# Patient Record
Sex: Female | Born: 1985
Health system: Southern US, Community
[De-identification: ages and names within clinical notes are randomized; demographics above are authoritative.]

## PROBLEM LIST (undated history)

## (undated) ENCOUNTER — Emergency Department (HOSPITAL_BASED_OUTPATIENT_CLINIC_OR_DEPARTMENT_OTHER): Admission: EM | Payer: Self-pay | Source: Home / Self Care

## (undated) DIAGNOSIS — N6320 Unspecified lump in the left breast, unspecified quadrant: Secondary | ICD-10-CM

## (undated) DIAGNOSIS — J209 Acute bronchitis, unspecified: Secondary | ICD-10-CM

## (undated) DIAGNOSIS — F17200 Nicotine dependence, unspecified, uncomplicated: Secondary | ICD-10-CM

## (undated) HISTORY — DX: Acute bronchitis, unspecified: J20.9

## (undated) HISTORY — DX: Unspecified lump in the left breast, unspecified quadrant: N63.20

## (undated) HISTORY — DX: Nicotine dependence, unspecified, uncomplicated: F17.200

## (undated) HISTORY — PX: TUBAL LIGATION: SHX77

## (undated) HISTORY — PX: DENTAL SURGERY: SHX609

---

## 2012-09-03 ENCOUNTER — Encounter (HOSPITAL_COMMUNITY): Payer: Self-pay | Admitting: Emergency Medicine

## 2012-09-03 ENCOUNTER — Emergency Department (HOSPITAL_COMMUNITY)
Admission: EM | Admit: 2012-09-03 | Discharge: 2012-09-03 | Disposition: A | Discharge status: Home / Self Care | Payer: Medicaid Other | Attending: Emergency Medicine | Admitting: Emergency Medicine

## 2012-09-03 DIAGNOSIS — IMO0002 Reserved for concepts with insufficient information to code with codable children: Secondary | ICD-10-CM | POA: Insufficient documentation

## 2012-09-03 DIAGNOSIS — S39012A Strain of muscle, fascia and tendon of lower back, initial encounter: Secondary | ICD-10-CM

## 2012-09-03 DIAGNOSIS — Y929 Unspecified place or not applicable: Secondary | ICD-10-CM | POA: Insufficient documentation

## 2012-09-03 DIAGNOSIS — X58XXXA Exposure to other specified factors, initial encounter: Secondary | ICD-10-CM | POA: Insufficient documentation

## 2012-09-03 DIAGNOSIS — F17200 Nicotine dependence, unspecified, uncomplicated: Secondary | ICD-10-CM | POA: Insufficient documentation

## 2012-09-03 DIAGNOSIS — Y939 Activity, unspecified: Secondary | ICD-10-CM | POA: Insufficient documentation

## 2012-09-03 MED ORDER — IBUPROFEN 600 MG PO TABS: 600.0000 mg | ORAL_TABLET | Freq: Four times a day (QID) | ORAL | Status: DC | PRN

## 2012-09-03 MED ORDER — KETOROLAC TROMETHAMINE 60 MG/2ML IM SOLN
60.0000 mg | Freq: Once | INTRAMUSCULAR | Status: AC
  Administered 2012-09-03: 60 mg via INTRAMUSCULAR
  Filled 2012-09-03: qty 2

## 2012-09-03 MED ORDER — CYCLOBENZAPRINE HCL 10 MG PO TABS: 10.0000 mg | ORAL_TABLET | Freq: Two times a day (BID) | ORAL | Status: DC | PRN

## 2012-09-03 NOTE — ED Notes (Signed)
Per [facility]- pt presents with NAD_ c/o of shoulder blade pain bil x 1 day- Pt c/o of increased pain with deep breath and movement.  GCS 15 PEERL No neuro deficits

## 2012-09-03 NOTE — ED Notes (Signed)
To ED via [facility] with c/o woke up with "difficulty breathing" SIde hurts- "Catches when I take a breath" A/O x3 , No obvious resp disitress.

## 2012-09-03 NOTE — ED Provider Notes (Signed)
History     CSN: [id number]  Arrival date & time 09/03/12  1130   First MD Initiated Contact with Patient 09/03/12 1247      Chief Complaint  Patient presents with  . Back Pain  . hurts to breath     (Consider location/radiation/quality/duration/timing/severity/associated sxs/prior treatment) HPI Comments: This is a 26 year old female, who presents to the emergency department with chief complaint of back pain. Patient states that upon waking this morning, she noticed a sharp stabbing sensation on the left side of her back. Her symptoms are reproduced with deep breathing and with palpation. Her pain is 7/10. She has not tried taking anything to alleviate her symptoms. She denies chest pain, shortness of breath, nausea, vomiting, diarrhea, constipation, dysuria, vaginal discharge, numbness or tingling of the extremities.  Her cardiac risk factors include family history and smoking. She denies recent travel.  The history is provided by the patient. No language interpreter was used.    History reviewed. No pertinent past medical history.  Past Surgical History  Procedure Date  . Tubal ligation 2 years ago  . Dental surgery     History reviewed. No pertinent family history.  History  Substance Use Topics  . Smoking status: Current Every Day Smoker -- 0.5 packs/day  . Smokeless tobacco: Not on file  . Alcohol Use: Yes     Comment: occasionally    OB History    Grav Para Term Preterm Abortions TAB SAB Ect Mult Living                  Review of Systems  Musculoskeletal: Positive for back pain.  All other systems reviewed and are negative.    Allergies  Review of patient's allergies indicates no known allergies.  Home Medications  No current outpatient prescriptions on file.  BP 122/75  Pulse 73  Temp 98.3 F (36.8 C) (Oral)  Resp 16  SpO2 98%  LMP 08/17/2012  Physical Exam  Nursing note and vitals reviewed. Constitutional: She is oriented to person, place,  and time. She appears well-developed and well-nourished.  HENT:  Head: Normocephalic and atraumatic.  Eyes: Conjunctivae normal and EOM are normal. Pupils are equal, round, and reactive to light.  Neck: Normal range of motion. Neck supple.  Cardiovascular: Normal rate and regular rhythm.  Exam reveals no gallop and no friction rub.   No murmur heard. Pulmonary/Chest: Effort normal and breath sounds normal. No respiratory distress. She has no wheezes. She has no rales. She exhibits no tenderness.  Abdominal: Soft. Bowel sounds are normal. She exhibits no distension and no mass. There is no tenderness. There is no rebound and no guarding.  Musculoskeletal: Normal range of motion. She exhibits tenderness. She exhibits no edema.       No bony tenderness over the spine. Left-sided latissimus dorsi, rhomboids, lower traps tender to palpation with several palpable trigger points.  Full range of motion, strength and sensation intact bilaterally  Neurological: She is alert and oriented to person, place, and time.  Skin: Skin is warm and dry.  Psychiatric: She has a normal mood and affect. Her behavior is normal. Judgment and thought content normal.    ED Course  Procedures (including critical care time)     1. Back strain       MDM  26 year old female with back ache. I believe the patient's symptoms are coming from sore muscles. She does not having chest pain, shortness of breath.  Doubt PE based on PERC rule out.  I am going to give the patient 60 mg Toradol IM, and re-evaluate.  2:54 PM Patient feeling better after receiving Toradol.  I believe this to musculoskeletal in nature.  I am going to send the patient home with ibuprofen and flexeril.  I have instructed the patient to ice 3x daily for 20 minutes.  I have also encouraged getting follow-up care with a PCP.  The patient is agreeable with this.  The patient is stable and ready for discharge.        Roxy Horseman,  PA-C 09/03/12 1457

## 2012-09-03 NOTE — ED Notes (Addendum)
Pt reports blurred vision, has been going on for few months. C/o pain with inhalation/deep breathing on left side of back/rib cage. Pain with sneezing. Does not know if diabetic. Family hx of cardiac problems

## 2012-09-11 NOTE — ED Provider Notes (Signed)
Medical screening examination/treatment/procedure(s) were performed by non-physician practitioner and as supervising physician I was immediately available for consultation/collaboration.  Nixon Kolton, MD 09/11/12 0134 

## 2012-09-11 NOTE — ED Provider Notes (Signed)
Medical screening examination/treatment/procedure(s) were performed by non-physician practitioner and as supervising physician I was immediately available for consultation/collaboration.  Raeford Razor, MD 09/11/12 [phone]

## 2013-07-17 ENCOUNTER — Emergency Department (HOSPITAL_COMMUNITY)
Admission: EM | Admit: 2013-07-17 | Discharge: 2013-07-18 | Disposition: A | Discharge status: Home / Self Care | Payer: Self-pay | Attending: Emergency Medicine | Admitting: Emergency Medicine

## 2013-07-17 ENCOUNTER — Encounter (HOSPITAL_COMMUNITY): Payer: Self-pay | Admitting: *Deleted

## 2013-07-17 DIAGNOSIS — N898 Other specified noninflammatory disorders of vagina: Secondary | ICD-10-CM | POA: Insufficient documentation

## 2013-07-17 DIAGNOSIS — F17200 Nicotine dependence, unspecified, uncomplicated: Secondary | ICD-10-CM | POA: Insufficient documentation

## 2013-07-17 DIAGNOSIS — Z9851 Tubal ligation status: Secondary | ICD-10-CM | POA: Insufficient documentation

## 2013-07-17 DIAGNOSIS — Z3202 Encounter for pregnancy test, result negative: Secondary | ICD-10-CM | POA: Insufficient documentation

## 2013-07-17 LAB — URINALYSIS, ROUTINE W REFLEX MICROSCOPIC
Ketones, ur: 15 mg/dL — AB
Nitrite: NEGATIVE
Protein, ur: NEGATIVE mg/dL
pH: 6 (ref 5.0–8.0)

## 2013-07-17 LAB — POCT I-STAT, CHEM 8
Calcium, Ion: 1.19 mmol/L (ref 1.12–1.23)
Chloride: 108 mEq/L (ref 96–112)
HCT: 42 % (ref 36.0–46.0)
Sodium: 145 mEq/L (ref 135–145)
TCO2: 27 mmol/L (ref 0–100)

## 2013-07-17 LAB — POCT PREGNANCY, URINE: Preg Test, Ur: NEGATIVE

## 2013-07-17 LAB — URINE MICROSCOPIC-ADD ON

## 2013-07-17 NOTE — ED Notes (Signed)
Pt states. "urine very dark, tongue dry, and lower abd. Pain."

## 2013-07-17 NOTE — ED Notes (Addendum)
Blister on upper left side. No drainage. Wants to be checked out for STD. Ariana Rogers vaginal d/c x 1 week. Happens before and after cycle.

## 2013-07-18 LAB — WET PREP, GENITAL: Trich, Wet Prep: NONE SEEN

## 2013-07-18 LAB — RPR: RPR Ser Ql: NONREACTIVE

## 2013-07-18 MED ORDER — AZITHROMYCIN 250 MG PO TABS
1000.0000 mg | ORAL_TABLET | Freq: Once | ORAL | Status: AC
  Administered 2013-07-18: 1000 mg via ORAL
  Filled 2013-07-18: qty 4

## 2013-07-18 MED ORDER — CEFTRIAXONE SODIUM 250 MG IJ SOLR
250.0000 mg | Freq: Once | INTRAMUSCULAR | Status: AC
  Administered 2013-07-18: 250 mg via INTRAMUSCULAR
  Filled 2013-07-18: qty 250

## 2013-07-18 MED ORDER — LIDOCAINE HCL (PF) 1 % IJ SOLN
INTRAMUSCULAR | Status: AC
  Administered 2013-07-18: 0.9 mL
  Filled 2013-07-18: qty 5

## 2013-07-18 NOTE — ED Notes (Signed)
MD at bedside. 

## 2013-07-19 LAB — GC/CHLAMYDIA PROBE AMP: GC Probe RNA: NEGATIVE

## 2013-07-19 NOTE — ED Provider Notes (Signed)
CSN: [id number]     Arrival date & time 07/17/13  2001 History   First MD Initiated Contact with Patient 07/17/13 2354     Chief Complaint  Patient presents with  . Blister   (Consider location/radiation/quality/duration/timing/severity/associated sxs/prior Treatment) HPI History provided by patient. Is sexually active and has developed white vaginal discharge. No pelvic pain. Patient also concerned about a blister to her left upper lip. No history of same. No genital lesions. No known STD exposures. Patient does have history of STD in the past. No back pain or abdominal pain. No fevers or chills. History reviewed. No pertinent past medical history. Past Surgical History  Procedure Laterality Date  . Tubal ligation  2 years ago  . Dental surgery     History reviewed. No pertinent family history. History  Substance Use Topics  . Smoking status: Current Every Day Smoker -- 0.50 packs/day  . Smokeless tobacco: Not on file  . Alcohol Use: Yes     Comment: occasionally   OB History   Grav Para Term Preterm Abortions TAB SAB Ect Mult Living                 Review of Systems  Constitutional: Negative for fever and chills.  HENT: Negative for neck pain and neck stiffness.   Eyes: Negative for pain.  Respiratory: Negative for shortness of breath.   Cardiovascular: Negative for chest pain.  Gastrointestinal: Negative for vomiting and abdominal pain.  Genitourinary: Positive for vaginal discharge. Negative for dysuria and vaginal bleeding.  Musculoskeletal: Negative for back pain.  Skin: Negative for rash.  Neurological: Negative for headaches.  All other systems reviewed and are negative.    Allergies  Review of patient's allergies indicates no known allergies.  Home Medications   Current Outpatient Rx  Name  Route  Sig  Dispense  Refill  . ibuprofen (ADVIL,MOTRIN) 200 MG tablet   Oral   Take 800 mg by mouth every 6 (six) hours as needed for pain or headache.           BP 125/87  Pulse 58  Temp(Src) 98.3 F (36.8 C) (Oral)  Resp 16  SpO2 98%  LMP 07/14/2013 Physical Exam  Constitutional: She is oriented to person, place, and time. She appears well-developed and well-nourished.  HENT:  Head: Normocephalic and atraumatic.  Erythematous patch of blisters left upper lip  Eyes: EOM are normal. Pupils are equal, round, and reactive to light.  Neck: Neck supple.  Cardiovascular: Normal rate, regular rhythm and intact distal pulses.   Pulmonary/Chest: Effort normal and breath sounds normal. No respiratory distress.  Abdominal: Soft. Bowel sounds are normal. She exhibits no distension. There is no tenderness.  Genitourinary:  Normal external GU without rash or lesions. Moderate white vaginal discharge. No cervical motion tenderness. No adnexal tenderness.  Musculoskeletal: Normal range of motion. She exhibits no edema.  Neurological: She is alert and oriented to person, place, and time.  Skin: Skin is warm and dry.    ED Course  Procedures (including critical care time) Labs Review Labs Reviewed  WET PREP, GENITAL - Abnormal; Notable for the following:    Clue Cells Wet Prep HPF POC FEW (*)    All other components within normal limits  URINALYSIS, ROUTINE W REFLEX MICROSCOPIC - Abnormal; Notable for the following:    APPearance CLOUDY (*)    Bilirubin Urine SMALL (*)    Ketones, ur 15 (*)    Leukocytes, UA SMALL (*)    All other  components within normal limits  URINE MICROSCOPIC-ADD ON - Abnormal; Notable for the following:    Squamous Epithelial / LPF MANY (*)    All other components within normal limits  GC/CHLAMYDIA PROBE AMP  RPR  POCT I-STAT, CHEM 8  POCT PREGNANCY, URINE   Patient requesting medications for STD and plan followup health department. Referral to women's clinic provided.  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 Vaginal discharge   2. HSV  1  Pending STD results Labs reviewed as above Medications provided Vital signs and nursing notes reviewed and considered    Sunnie Nielsen, MD 07/20/13 [phone]

## 2013-10-25 ENCOUNTER — Emergency Department (HOSPITAL_BASED_OUTPATIENT_CLINIC_OR_DEPARTMENT_OTHER): Payer: No Typology Code available for payment source

## 2013-10-25 ENCOUNTER — Encounter (HOSPITAL_BASED_OUTPATIENT_CLINIC_OR_DEPARTMENT_OTHER): Payer: Self-pay | Admitting: Emergency Medicine

## 2013-10-25 ENCOUNTER — Emergency Department (HOSPITAL_BASED_OUTPATIENT_CLINIC_OR_DEPARTMENT_OTHER)
Admission: EM | Admit: 2013-10-25 | Discharge: 2013-10-25 | Disposition: A | Discharge status: Home / Self Care | Payer: No Typology Code available for payment source | Attending: Emergency Medicine | Admitting: Emergency Medicine

## 2013-10-25 DIAGNOSIS — S39012A Strain of muscle, fascia and tendon of lower back, initial encounter: Secondary | ICD-10-CM

## 2013-10-25 DIAGNOSIS — S29019A Strain of muscle and tendon of unspecified wall of thorax, initial encounter: Secondary | ICD-10-CM

## 2013-10-25 DIAGNOSIS — S335XXA Sprain of ligaments of lumbar spine, initial encounter: Secondary | ICD-10-CM | POA: Insufficient documentation

## 2013-10-25 DIAGNOSIS — S239XXA Sprain of unspecified parts of thorax, initial encounter: Secondary | ICD-10-CM | POA: Insufficient documentation

## 2013-10-25 DIAGNOSIS — Y9289 Other specified places as the place of occurrence of the external cause: Secondary | ICD-10-CM | POA: Insufficient documentation

## 2013-10-25 DIAGNOSIS — F17200 Nicotine dependence, unspecified, uncomplicated: Secondary | ICD-10-CM | POA: Insufficient documentation

## 2013-10-25 DIAGNOSIS — Y9389 Activity, other specified: Secondary | ICD-10-CM | POA: Insufficient documentation
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
## 2013-10-25 MED ORDER — CYCLOBENZAPRINE HCL 10 MG PO TABS: 10.0000 mg | ORAL_TABLET | Freq: Three times a day (TID) | ORAL | Status: DC | PRN
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
## 2013-11-10 ENCOUNTER — Telehealth: Payer: Self-pay | Admitting: General Practice

## 2013-11-10 NOTE — Telephone Encounter (Signed)
Called pt, unable to talk will call back to schedule appt.

## 2014-01-13 ENCOUNTER — Emergency Department (HOSPITAL_COMMUNITY): Payer: Self-pay

## 2014-01-13 ENCOUNTER — Encounter (HOSPITAL_COMMUNITY): Payer: Self-pay | Admitting: Emergency Medicine

## 2014-01-13 ENCOUNTER — Emergency Department (HOSPITAL_COMMUNITY)
Admission: EM | Admit: 2014-01-13 | Discharge: 2014-01-13 | Disposition: A | Discharge status: Home / Self Care | Payer: Self-pay | Attending: Emergency Medicine | Admitting: Emergency Medicine

## 2014-01-13 DIAGNOSIS — X500XXA Overexertion from strenuous movement or load, initial encounter: Secondary | ICD-10-CM | POA: Insufficient documentation

## 2014-01-13 DIAGNOSIS — R296 Repeated falls: Secondary | ICD-10-CM | POA: Insufficient documentation

## 2014-01-13 DIAGNOSIS — Y92838 Other recreation area as the place of occurrence of the external cause: Secondary | ICD-10-CM

## 2014-01-13 DIAGNOSIS — R269 Unspecified abnormalities of gait and mobility: Secondary | ICD-10-CM | POA: Insufficient documentation

## 2014-01-13 DIAGNOSIS — Y9239 Other specified sports and athletic area as the place of occurrence of the external cause: Secondary | ICD-10-CM | POA: Insufficient documentation

## 2014-01-13 DIAGNOSIS — S8992XA Unspecified injury of left lower leg, initial encounter: Secondary | ICD-10-CM

## 2014-01-13 DIAGNOSIS — S99919A Unspecified injury of unspecified ankle, initial encounter: Principal | ICD-10-CM

## 2014-01-13 DIAGNOSIS — F17200 Nicotine dependence, unspecified, uncomplicated: Secondary | ICD-10-CM | POA: Insufficient documentation

## 2014-01-13 DIAGNOSIS — Y9367 Activity, basketball: Secondary | ICD-10-CM | POA: Insufficient documentation

## 2014-01-13 DIAGNOSIS — S8990XA Unspecified injury of unspecified lower leg, initial encounter: Secondary | ICD-10-CM | POA: Insufficient documentation

## 2014-01-13 DIAGNOSIS — S99929A Unspecified injury of unspecified foot, initial encounter: Principal | ICD-10-CM

## 2014-01-13 IMAGING — CR DG KNEE COMPLETE 4+V*L*
4 series · 4 of 4 positions shown · non-contrast
Comparison: None.

CLINICAL DATA: Severe left knee pain, limited range of motion

EXAM:
LEFT KNEE - COMPLETE 4+ VIEW

[t knee ap left]
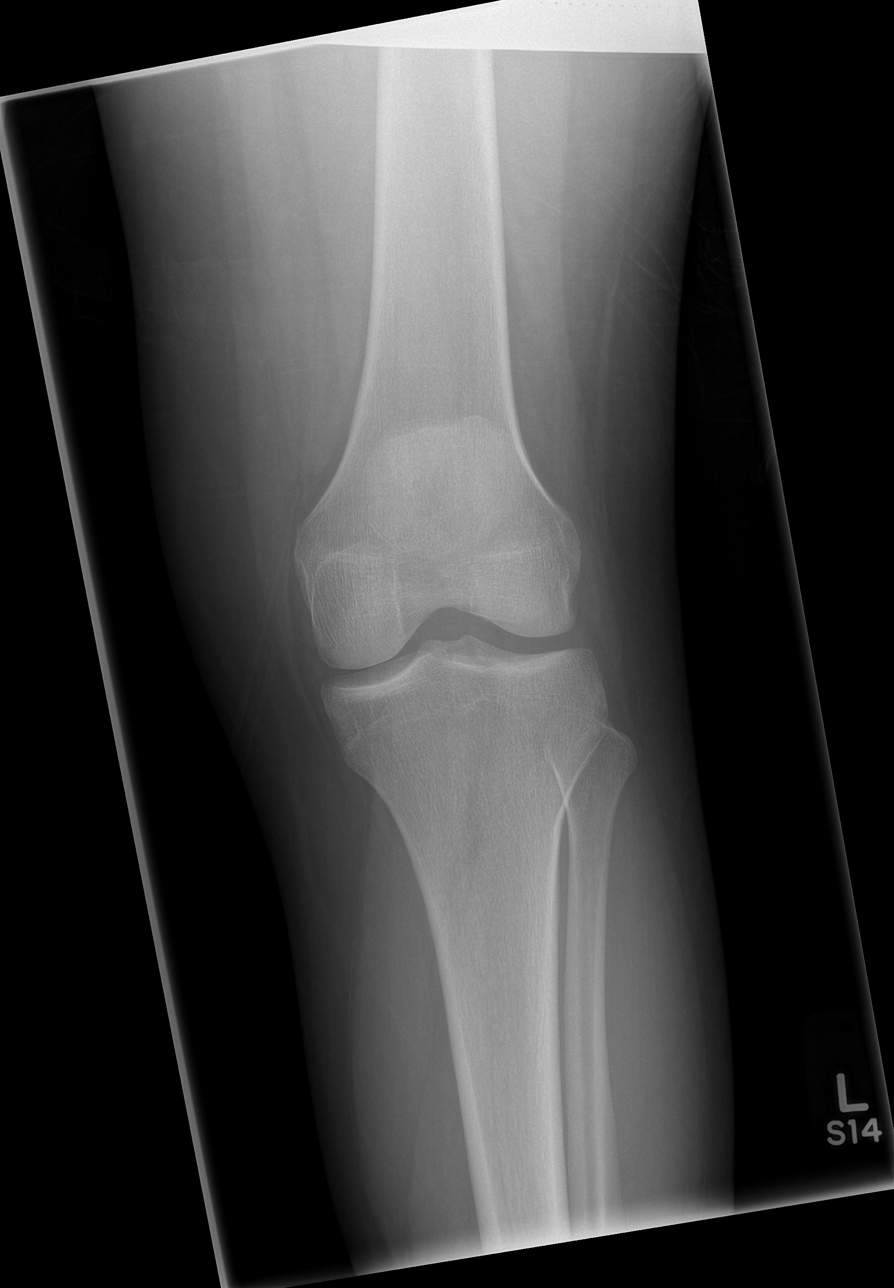

[t knee oblique left (1 of 2)]
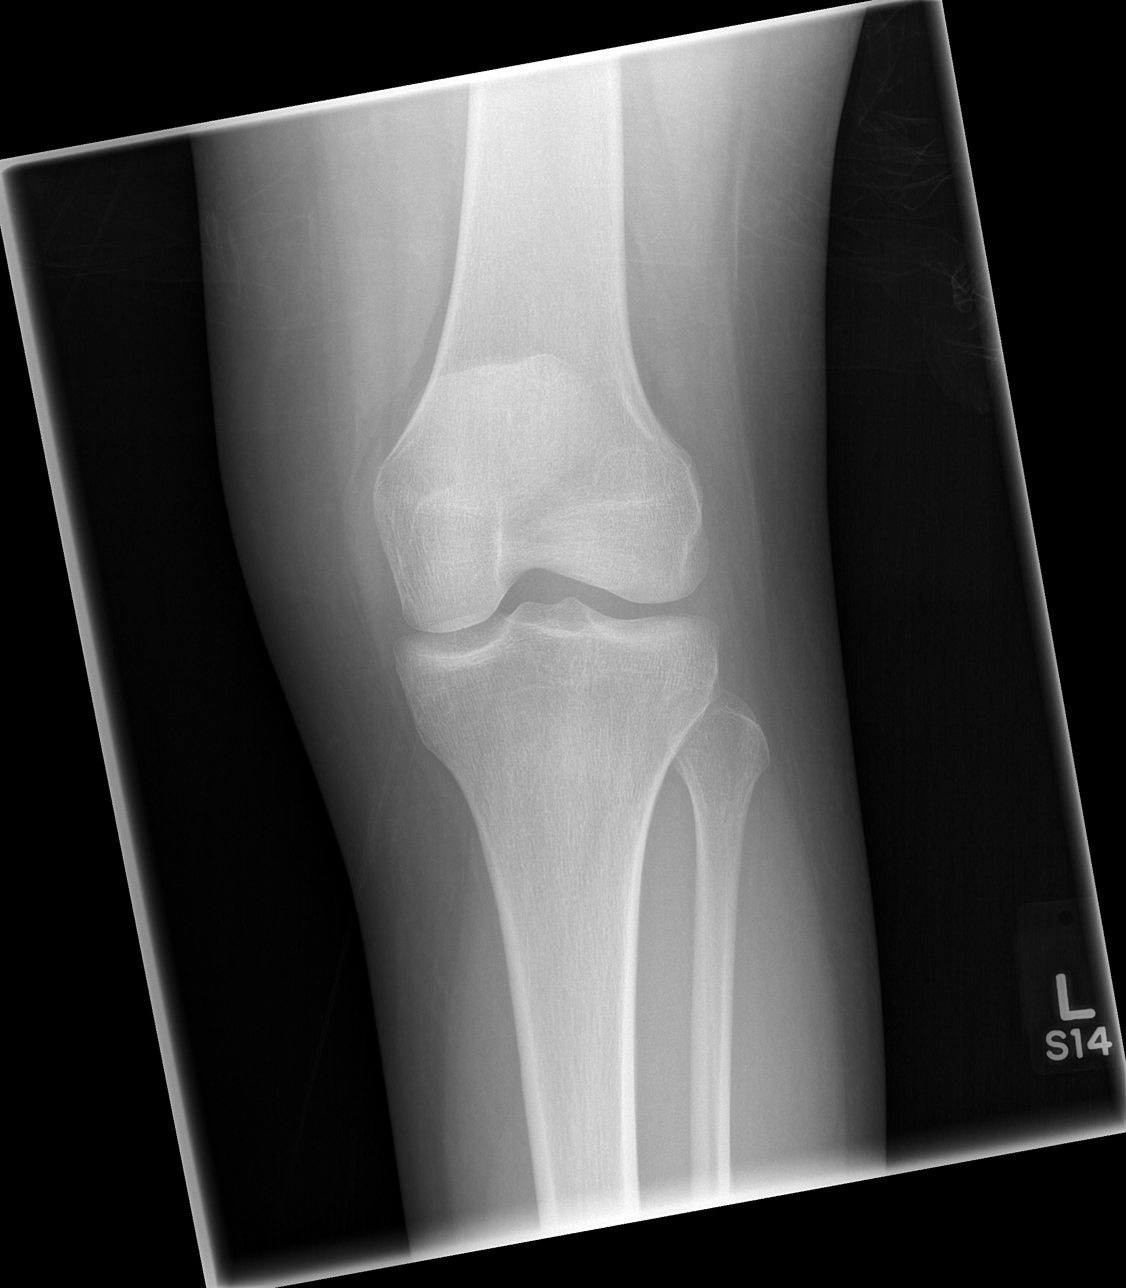

[t knee oblique left (2 of 2)]
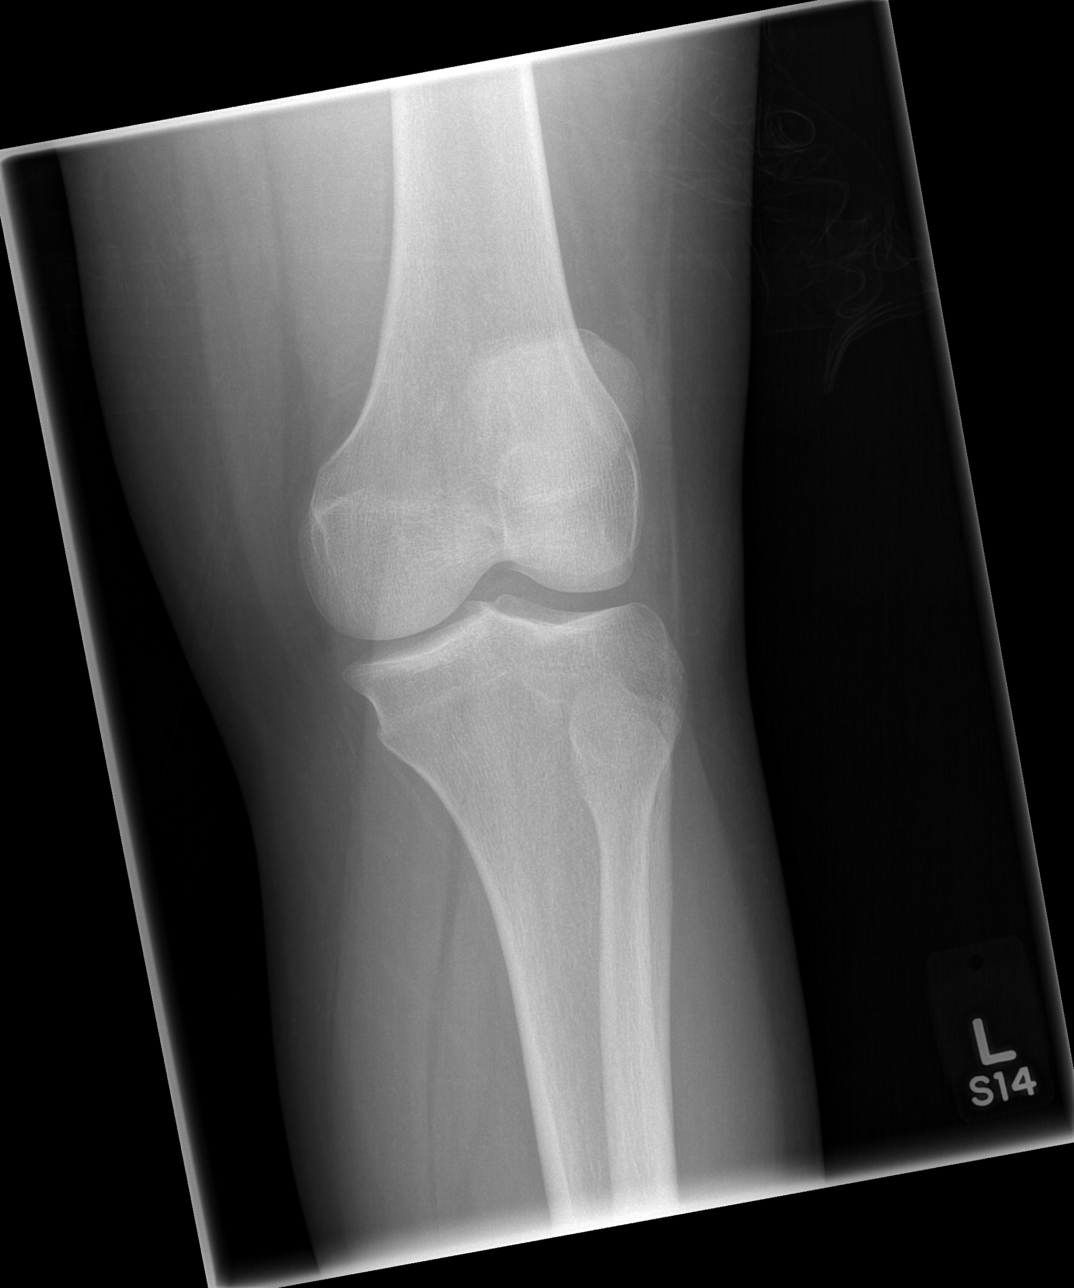

[t knee lat left]
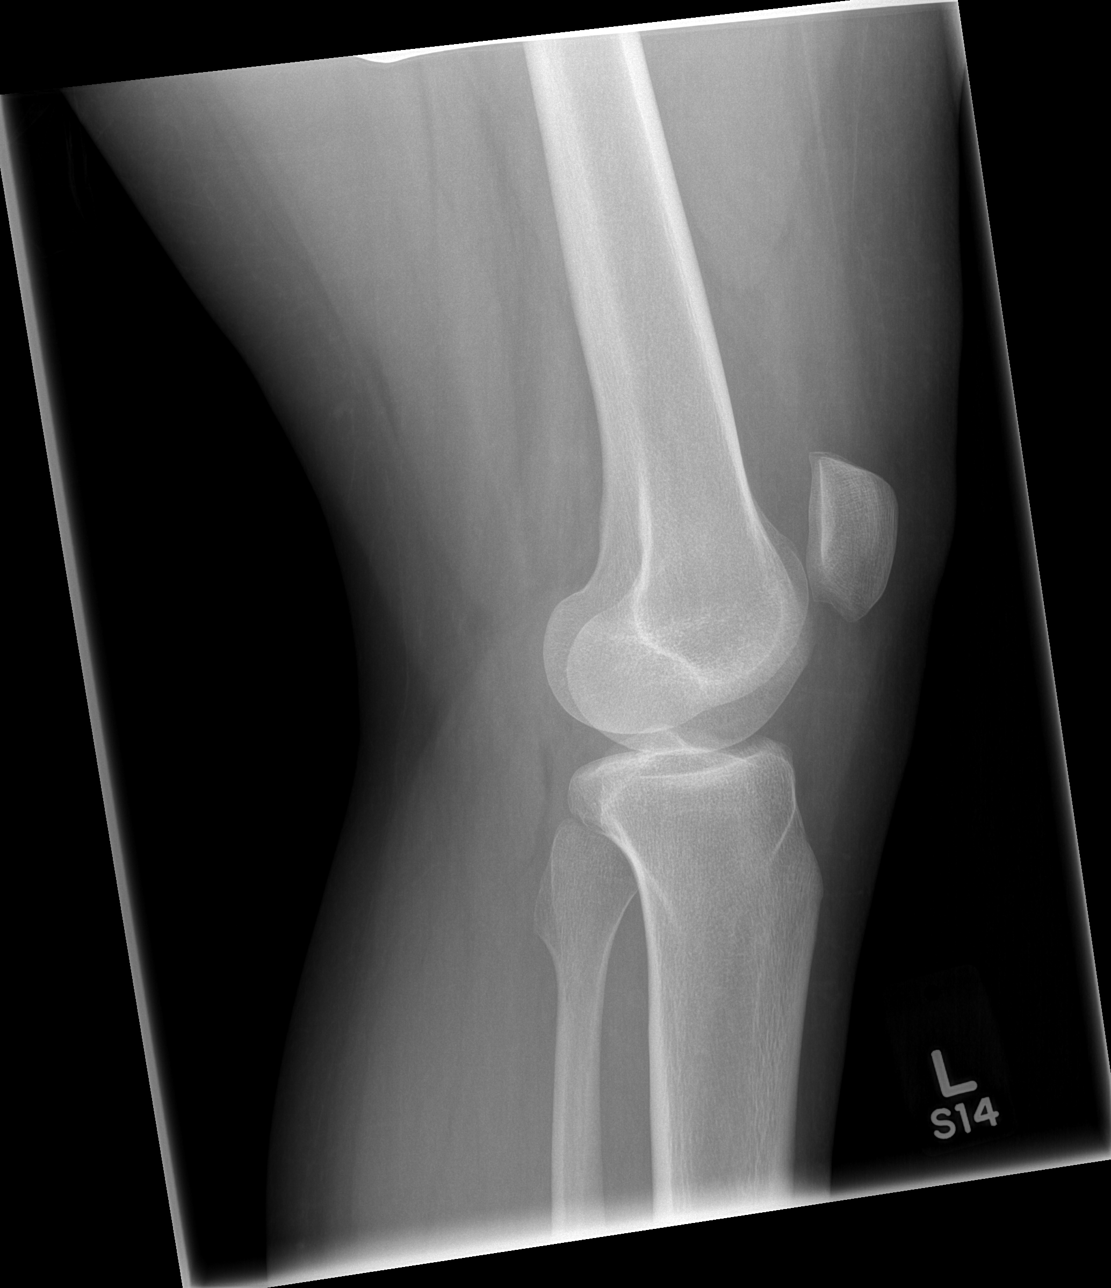

[4 of 4 positions shown; findings below may reference images not displayed]

FINDINGS: No acute fracture is seen. Joint spaces appear normal. No joint
effusion is seen.
IMPRESSION: Negative.

## 2014-01-13 MED ORDER — IBUPROFEN 800 MG PO TABS: 800.0000 mg | ORAL_TABLET | Freq: Three times a day (TID) | ORAL | Status: DC

## 2014-01-13 NOTE — ED Notes (Signed)
Pt reports twisting left knee last night, now having pain and swelling, difficulty bearing weight.

## 2014-01-13 NOTE — ED Notes (Signed)
Ortho called to come and place knee immobilizer and give patient crutches.

## 2014-01-13 NOTE — ED Provider Notes (Signed)
Pt presents 24 hours after suffering L knee injury after a twist and fall - no other injuries.  C/o pain in the medial inferior knee at the join.  No swelling, no redness, no fever, no numbness or weakness.  Was able to ambullate with minimal difficulty yesterday - today with increased difficulty ambulating.    On exam has mild swelling c/w R side, has no warmth, no redness and some tenderness over the joint space on the medial L side knee.  Has some dec ROM to flexion and extreme extension.  Has stable joint otherwise.  No clicking or popping.  Othewise compartments above and below the knee are soft and ankle and hip are supple and without paint.  Medical screening examination/treatment/procedure(s) were conducted as a shared visit with non-physician practitioner(s) and myself.  I personally evaluated the patient during the encounter.  Clinical Impression: Sprain of Left Knee  Shatora Weatherbee D Kenyan Karnes, MD 01/13/14 1025 

## 2014-01-13 NOTE — ED Provider Notes (Signed)
1CSN: [id number]     Arrival date & time 01/13/14  4098 History  This chart was scribed for Mellody Drown, PA working with Lyanne Co, MD by Quintella Reichert, ED Scribe. This patient was seen in room TR10C/TR10C and the patient's care was started at 9:59 AM.   Chief Complaint  Patient presents with  . Knee Injury    The history is provided by the patient. No language interpreter was used.    HPI Comments: Ariana Rogers is a 28 y.o. female who presents to the Emergency Department complaining of a left knee injury sustained last night.  Pt reports she was playing basketball and felt her knee twist inward and she fell onto the floor.  She did not have significant pain initially but since this morning on waking she has had severe pain to the anterior left knee that is worsened by bearing weight and flexion past a certain point.  She has been able to walk with difficulty and has needed some assistance.  She also reports some clicking, catching and a feeling of instability in that knee.  Pt denies prior h/o injury to that knee.   She does not have an orthopedist.  She denies pain or injury to any other area.    History reviewed. No pertinent past medical history.   Past Surgical History  Procedure Laterality Date  . Tubal ligation  2 years ago  . Dental surgery      History reviewed. No pertinent family history.   History  Substance Use Topics  . Smoking status: Current Every Day Smoker -- 0.50 packs/day  . Smokeless tobacco: Not on file  . Alcohol Use: Yes     Comment: occasionally    OB History   Grav Para Term Preterm Abortions TAB SAB Ect Mult Living                   Review of Systems  Musculoskeletal: Positive for arthralgias (left knee), gait problem and joint swelling.  Neurological: Negative for numbness.      Allergies  Review of patient's allergies indicates no known allergies.  Home Medications  No current outpatient prescriptions on file.  BP 122/79   Pulse 87  Temp(Src) 98.2 F (36.8 C) (Oral)  Resp 18  Ht 5' 5.75" (1.67 m)  Wt 240 lb (108.863 kg)  BMI 39.03 kg/m2  SpO2 98%  LMP 12/28/2013  Physical Exam  Nursing note and vitals reviewed. Constitutional: She is oriented to person, place, and time. She appears well-developed and well-nourished. No distress.  HENT:  Head: Normocephalic and atraumatic.  Eyes: EOM are normal.  Neck: Neck supple.  Pulmonary/Chest: Effort normal. No respiratory distress.  Musculoskeletal:       Left knee: She exhibits swelling. She exhibits no deformity, no laceration, no erythema, no LCL laxity, normal patellar mobility and no MCL laxity. Tenderness found. Medial joint line tenderness noted.  Decrease active ROM secondary to pain.  Decrease passive ROM with internal rotation of the lower extremity. Minimal swelling of the left knee. No crepitus.  Neurological: She is alert and oriented to person, place, and time.  Skin: Skin is warm and dry.  Psychiatric: She has a normal mood and affect. Her behavior is normal.    ED Course  Procedures (including critical care time)  COORDINATION OF CARE: 10:07 AM-Informed pt that x-ray is negative.  Discussed treatment plan which includes RICE treatment and orthopedic f/u with pt at bedside and pt agreed to plan.  Labs Review Labs Reviewed - No data to display  Imaging Review Dg Knee Complete 4 Views Left  01/13/2014   CLINICAL DATA:  Severe left knee pain, limited range of motion  EXAM: LEFT KNEE - COMPLETE 4+ VIEW  COMPARISON:  None.  FINDINGS: No acute fracture is seen. Joint spaces appear normal. No joint effusion is seen.  IMPRESSION: Negative.   Electronically Signed   By: Dwyane DeePaul  Barry M.D.   On: 01/13/2014 09:44     EKG Interpretation None      MDM   Final diagnoses:  Left knee injury   Pt with left knee injury Likely meniscus or ligament injury.  Negative XR. Knee immobilizer crutches. RICE encouraged. Follow up with Ortho. Discussed  imaging results, and treatment plan with the patient. Return precautions given. Reports understanding and no other concerns at this time.  Patient is stable for discharge at this time.  Meds given in ED:  Medications - No data to display  New Prescriptions   IBUPROFEN (ADVIL,MOTRIN) 800 MG TABLET    Take 1 tablet (800 mg total) by mouth 3 (three) times daily. Take with Food    I personally performed the services described in this documentation, which was scribed in my presence. The recorded information has been reviewed and is accurate.     Clabe SealLauren M Clois Treanor, PA-C 01/13/14 1020

## 2014-01-13 NOTE — Discharge Planning (Signed)
[facility] Ariana Rogers, [employer]  Spoke to patient about primary care resources and establishing primary care. Patient states she did have medicaid but now has to reapply. Resource guide and my contact information given for any future questions or concerns.

## 2014-01-13 NOTE — Discharge Instructions (Signed)
Call Dr. Magnus IvanBlackman for further evaluation of your knee injury. Call for a follow up appointment with a Family or Primary Care Provider.  Return if Symptoms worsen.   Take medication as prescribed.  Ice your knee 3-4 times a day. Use the crutches and knee brace until you see Dr. Magnus IvanBlackman or another Orthopedic surgeon.

## 2014-01-13 NOTE — Progress Notes (Signed)
Orthopedic Tech Progress Note Patient Details:  Leontine LocketShanon Marcus 1986/05/08 [id number]  Ortho Devices Type of Ortho Device: Knee Immobilizer;Crutches Ortho Device/Splint Interventions: Application   Cammer, Mickie BailJennifer Carol 01/13/2014, 10:45 AM

## 2014-01-13 NOTE — ED Provider Notes (Signed)
Pt presents 24 hours after suffering L knee injury after a twist and fall - no other injuries.  C/o pain in the medial inferior knee at the join.  No swelling, no redness, no fever, no numbness or weakness.  Was able to ambullate with minimal difficulty yesterday - today with increased difficulty ambulating.    On exam has mild swelling c/w R side, has no warmth, no redness and some tenderness over the joint space on the medial L side knee.  Has some dec ROM to flexion and extreme extension.  Has stable joint otherwise.  No clicking or popping.  Othewise compartments above and below the knee are soft and ankle and hip are supple and without paint.  Medical screening examination/treatment/procedure(s) were conducted as a shared visit with non-physician practitioner(s) and myself.  I personally evaluated the patient during the encounter.  Clinical Impression: Sprain of Left Knee  Vida RollerBrian D Noreene Boreman, MD 01/13/14 1025

## 2014-01-16 ENCOUNTER — Emergency Department (HOSPITAL_COMMUNITY)
Admission: EM | Admit: 2014-01-16 | Discharge: 2014-01-16 | Disposition: A | Discharge status: Home / Self Care | Payer: No Typology Code available for payment source | Attending: Emergency Medicine | Admitting: Emergency Medicine

## 2014-01-16 ENCOUNTER — Encounter (HOSPITAL_COMMUNITY): Payer: Self-pay | Admitting: Emergency Medicine

## 2014-01-16 DIAGNOSIS — S99929A Unspecified injury of unspecified foot, initial encounter: Principal | ICD-10-CM

## 2014-01-16 DIAGNOSIS — S8990XA Unspecified injury of unspecified lower leg, initial encounter: Secondary | ICD-10-CM | POA: Insufficient documentation

## 2014-01-16 DIAGNOSIS — Z791 Long term (current) use of non-steroidal anti-inflammatories (NSAID): Secondary | ICD-10-CM | POA: Insufficient documentation

## 2014-01-16 DIAGNOSIS — Y92410 Unspecified street and highway as the place of occurrence of the external cause: Secondary | ICD-10-CM | POA: Insufficient documentation

## 2014-01-16 DIAGNOSIS — M25569 Pain in unspecified knee: Secondary | ICD-10-CM

## 2014-01-16 DIAGNOSIS — Y9389 Activity, other specified: Secondary | ICD-10-CM | POA: Insufficient documentation

## 2014-01-16 DIAGNOSIS — F17200 Nicotine dependence, unspecified, uncomplicated: Secondary | ICD-10-CM | POA: Insufficient documentation

## 2014-01-16 DIAGNOSIS — S99919A Unspecified injury of unspecified ankle, initial encounter: Principal | ICD-10-CM

## 2014-01-16 MED ORDER — TRAMADOL HCL 50 MG PO TABS: 50.0000 mg | ORAL_TABLET | Freq: Four times a day (QID) | ORAL | Status: DC | PRN
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
## 2014-01-16 NOTE — ED Notes (Signed)
Pt called in main ED waiting area with no response 

## 2014-01-17 NOTE — ED Provider Notes (Signed)
Medical screening examination/treatment/procedure(s) were performed by non-physician practitioner and as supervising physician I was immediately available for consultation/collaboration.   EKG Interpretation None        Shon Batonourtney F Horton, MD 01/17/14 [phone]

## 2014-03-12 ENCOUNTER — Emergency Department (HOSPITAL_COMMUNITY)
Admission: EM | Admit: 2014-03-12 | Discharge: 2014-03-12 | Disposition: A | Discharge status: Home / Self Care | Payer: No Typology Code available for payment source | Attending: Emergency Medicine | Admitting: Emergency Medicine

## 2014-03-12 ENCOUNTER — Encounter (HOSPITAL_COMMUNITY): Payer: Self-pay | Admitting: Emergency Medicine

## 2014-03-12 DIAGNOSIS — F17200 Nicotine dependence, unspecified, uncomplicated: Secondary | ICD-10-CM | POA: Insufficient documentation

## 2014-03-12 DIAGNOSIS — L03317 Cellulitis of buttock: Principal | ICD-10-CM

## 2014-03-12 DIAGNOSIS — L0231 Cutaneous abscess of buttock: Secondary | ICD-10-CM

## 2014-03-12 MED ORDER — SULFAMETHOXAZOLE-TRIMETHOPRIM 800-160 MG PO TABS: 1.0000 | ORAL_TABLET | Freq: Two times a day (BID) | ORAL | Status: DC

## 2014-03-12 MED ORDER — CEPHALEXIN 500 MG PO CAPS: 500.0000 mg | ORAL_CAPSULE | Freq: Four times a day (QID) | ORAL | Status: DC

## 2014-03-12 MED ORDER — HYDROCODONE-ACETAMINOPHEN 5-325 MG PO TABS: 2.0000 | ORAL_TABLET | ORAL | Status: DC | PRN
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
## 2014-03-12 NOTE — ED Provider Notes (Signed)
Medical screening examination/treatment/procedure(s) were performed by non-physician practitioner and as supervising physician I was immediately available for consultation/collaboration.   EKG Interpretation None        Yanni Ruberg S Cordie Beazley, MD 03/12/14 1951 

## 2014-03-12 NOTE — Discharge Instructions (Signed)
Take Bactrim and Keflex as directed until gone. Take Vicodin as needed for pain. Refer to attached documents for more information. Return to the ED with worsening or concerning symptoms.  °

## 2014-03-12 NOTE — ED Provider Notes (Signed)
CSN: [id number]     Arrival date & time 03/12/14  1203 History  This chart was scribed for non-physician practitioner, Emilia BeckKaitlyn Kaeden Mester, PA-C working with Junius ArgyleForrest S Harrison, MD by Greggory StallionKayla Andersen, ED scribe. This patient was seen in room TR10C/TR10C and the patient's care was started at 2:47 PM.   Chief Complaint  Patient presents with  . Wound Infection   The history is provided by the patient. No language interpreter was used.   HPI Comments: Ariana Rogers is a 28 y.o. female who presents to the Emergency Department complaining of an abscess to her right buttocks that started 3 days ago. She has associated throbbing pain. Sitting down worsens the pain. Pt states there is already some drainage from the abscess.   History reviewed. No pertinent past medical history. Past Surgical History  Procedure Laterality Date  . Tubal ligation  2 years ago  . Dental surgery     No family history on file. History  Substance Use Topics  . Smoking status: Current Every Day Smoker -- 0.50 packs/day  . Smokeless tobacco: Not on file  . Alcohol Use: Yes     Comment: occasionally   OB History   Grav Para Term Preterm Abortions TAB SAB Ect Mult Living                 Review of Systems  Skin:       Abscess.  All other systems reviewed and are negative.  Allergies  Review of patient's allergies indicates no known allergies.  Home Medications   Prior to Admission medications   Not on File   BP 120/73  Pulse 103  Temp(Src) 98.2 F (36.8 C) (Oral)  Resp 18  Ht 5\' 5"  (1.651 m)  Wt 246 lb (111.585 kg)  BMI 40.94 kg/m2  SpO2 97%  Physical Exam  Nursing note and vitals reviewed. Constitutional: She is oriented to person, place, and time. She appears well-developed and well-nourished. No distress.  HENT:  Head: Normocephalic and atraumatic.  Eyes: EOM are normal.  Neck: Neck supple. No tracheal deviation present.  Cardiovascular: Normal rate.   Pulmonary/Chest: Effort normal. No  respiratory distress.  Musculoskeletal: Normal range of motion.  Neurological: She is alert and oriented to person, place, and time.  Skin: Skin is warm and dry.  4 x 4 cm area of fluctuance and tenderness of right buttock. The area has purulent drainage. Surrounding erythema.   Psychiatric: She has a normal mood and affect. Her behavior is normal.    ED Course  Procedures (including critical care time)  DIAGNOSTIC STUDIES: Oxygen Saturation is 97% on RA, normal by my interpretation.    COORDINATION OF CARE: 2:48 PM-Discussed treatment plan which includes I&D and an antibiotic with pt at bedside and pt agreed to plan.   INCISION AND DRAINAGE Performed by: Eddie CandleSarah Nelson, PA-Student Authorized by: Emilia BeckKaitlyn Sola Margolis, PA-C Consent: Verbal consent obtained. Risks and benefits: risks, benefits and alternatives were discussed Type: abscess  Body area: right buttock  Anesthesia: local infiltration  Incision was made with a scalpel.  Local anesthetic: lidocaine 2% without epinephrine  Anesthetic total: 1 ml  Complexity: complex Blunt dissection to break up loculations  Drainage: purulent  Drainage amount: 5 mL  Packing material: none  Patient tolerance: Patient tolerated the procedure well with no immediate complications.  Labs Review Labs Reviewed - No data to display  Imaging Review No results found.   EKG Interpretation None      MDM   Final diagnoses:  Cellulitis and abscess of buttock    3:25 PM Patient's abscess drained without difficulty. Patient will be discharged with Bactrim, Keflex, and Vicodin. Vitals stable and patient afebrile. Patient advised to return with worsening or concerning symptoms.   I personally performed the services described in this documentation, which was scribed in my presence. The recorded information has been reviewed and is accurate.  Emilia BeckKaitlyn Davonte Siebenaler, PA-C 03/12/14 1526

## 2014-12-11 ENCOUNTER — Emergency Department (HOSPITAL_COMMUNITY)
Admission: EM | Admit: 2014-12-11 | Discharge: 2014-12-12 | Disposition: A | Discharge status: Home / Self Care | Payer: Self-pay | Attending: Emergency Medicine | Admitting: Emergency Medicine

## 2014-12-11 ENCOUNTER — Encounter (HOSPITAL_COMMUNITY): Payer: Self-pay | Admitting: Emergency Medicine

## 2014-12-11 DIAGNOSIS — X58XXXA Exposure to other specified factors, initial encounter: Secondary | ICD-10-CM | POA: Insufficient documentation

## 2014-12-11 DIAGNOSIS — S8992XA Unspecified injury of left lower leg, initial encounter: Secondary | ICD-10-CM | POA: Insufficient documentation

## 2014-12-11 DIAGNOSIS — M25562 Pain in left knee: Secondary | ICD-10-CM

## 2014-12-11 DIAGNOSIS — Y9389 Activity, other specified: Secondary | ICD-10-CM | POA: Insufficient documentation

## 2014-12-11 DIAGNOSIS — R200 Anesthesia of skin: Secondary | ICD-10-CM | POA: Insufficient documentation

## 2014-12-11 DIAGNOSIS — Y998 Other external cause status: Secondary | ICD-10-CM | POA: Insufficient documentation

## 2014-12-11 DIAGNOSIS — Z720 Tobacco use: Secondary | ICD-10-CM | POA: Insufficient documentation

## 2014-12-11 DIAGNOSIS — Y9289 Other specified places as the place of occurrence of the external cause: Secondary | ICD-10-CM | POA: Insufficient documentation

## 2014-12-11 NOTE — ED Notes (Signed)
Pt. reports left knee pain with mild swelling injured this evening while standing , pt. stated " it turned the other way/ gave out " , denies fall .

## 2014-12-11 NOTE — ED Provider Notes (Signed)
CSN: [id number]     Arrival date & time 12/11/14  2341 History  This chart was scribed for non-physician practitioner, Dierdre ForthHannah Deray Dawes, PA-C, working with Loren Raceravid Yelverton, MD, by Bronson CurbJacqueline Melvin, ED Scribe. This patient was seen in room TR06C/TR06C and the patient's care was started at 11:56 PM.    Chief Complaint  Patient presents with  . Knee Injury    The history is provided by the patient and medical records. No language interpreter was used.     HPI Comments: Ariana Rogers is a 29 y.o. female, with no significant medical history, who presents to the Emergency Department complaining of sudden onset, worsening, constant, aching, 8/10 left knee pain that began approximately 5 hours ago. Patient states she injured her left knee upon standing and suspects it "came out of place". She denies falling. There is associated swelling to the knee and numbness/tingling of the left knee. Patient is able to ambulate with a limp, but is not fully weight bearing. She has not taken anything for pain relief. Patient reports history of injury to the same knee, stating she twisted it in March of 2015. She was evaluated, however, her examination was unremarkable. Patient denies any other injuries.   History reviewed. No pertinent past medical history. Past Surgical History  Procedure Laterality Date  . Tubal ligation  2 years ago  . Dental surgery     No family history on file. History  Substance Use Topics  . Smoking status: Current Every Day Smoker -- 0.50 packs/day  . Smokeless tobacco: Not on file  . Alcohol Use: Yes     Comment: occasionally   OB History    No data available     Review of Systems  Constitutional: Negative for fever and chills.  Gastrointestinal: Negative for nausea and vomiting.  Musculoskeletal: Positive for joint swelling and arthralgias. Negative for back pain, neck pain and neck stiffness.  Skin: Negative for wound.  Neurological: Positive for numbness.   Hematological: Does not bruise/bleed easily.  Psychiatric/Behavioral: The patient is not nervous/anxious.   All other systems reviewed and are negative.     Allergies  Review of patient's allergies indicates no known allergies.  Home Medications   Prior to Admission medications   Medication Sig Start Date End Date Taking? Authorizing Provider  cephALEXin (KEFLEX) 500 MG capsule Take 1 capsule (500 mg total) by mouth 4 (four) times daily. Patient not taking: Reported on 12/12/2014 03/12/14   [name], PA-C  HYDROcodone-acetaminophen (NORCO/VICODIN) 5-325 MG per tablet Take 1-2 tablets by mouth every 6 (six) hours as needed for moderate pain or severe pain. 12/12/14   Victoria Euceda, PA-C  sulfamethoxazole-trimethoprim (SEPTRA DS) 800-160 MG per tablet Take 1 tablet by mouth every 12 (twelve) hours. Patient not taking: Reported on 12/12/2014 03/12/14   [name], PA-C   Triage Vitals: BP 121/105 mmHg  Pulse 80  Temp(Src) 97.7 F (36.5 C) (Oral)  Resp 18  SpO2 98%  LMP 12/04/2014  Physical Exam  Constitutional: She appears well-developed and well-nourished. No distress.  HENT:  Head: Normocephalic and atraumatic.  Eyes: Conjunctivae are normal.  Neck: Normal range of motion.  Cardiovascular: Normal rate, regular rhythm, normal heart sounds and intact distal pulses.   Capillary refill < 3 sec  Pulmonary/Chest: Effort normal and breath sounds normal.  Musculoskeletal: She exhibits tenderness. She exhibits no edema.  ROM: Slightly decreased flexion of the left knee, full extension FROM of the left hip and ankle  Neurological: She is alert. Coordination normal.  Sensation intact to dull and sharp, subjectively decreased sensation circumfrentially of the left lower leg without decreased sensation to the foot.  Strength 5/5 in the LLE including resisted flexion and extension of the knee  Skin: Skin is warm and dry. She is not diaphoretic.  No tenting of the skin   Psychiatric: She has a normal mood and affect.  Nursing note and vitals reviewed.   ED Course  Procedures (including critical care time)  DIAGNOSTIC STUDIES: Oxygen Saturation is 98% on room air, normal by my interpretation.    COORDINATION OF CARE: At 0003 Discussed treatment plan with patient which includes anti-inflammatory and imaging. Patient agrees.   Labs Review Labs Reviewed - No data to display  Imaging Review Dg Knee Complete 4 Views Left  12/12/2014   CLINICAL DATA:  Twisting injury, pain and swelling.  No fall.  EXAM: LEFT KNEE - COMPLETE 4+ VIEW  COMPARISON:  LEFT knee radiographs January 13, 2014  FINDINGS: There is no evidence of fracture, dislocation, or joint effusion. There is no evidence of arthropathy or other focal bone abnormality. Soft tissues are unremarkable.  IMPRESSION: Negative.   Electronically Signed   By: Awilda Metro   On: 12/12/2014 01:22     EKG Interpretation None      MDM   Final diagnoses:  Left knee pain   [name] presents with left knee pain.  Patient X-Ray negative for obvious fracture or dislocation. Pain managed in ED. No clinical evidence of dislocation or peroneal nerve injury.  Pt advised to follow up with orthopedics if symptoms persist for possibility of missed fracture diagnosis. Patient given brace while in ED, conservative therapy recommended and discussed. Patient will be dc home & is agreeable with above plan.  I have personally reviewed patient's vitals, nursing note and any pertinent labs or imaging.  I performed an focused physical exam; undressed when appropriate .    It has been determined that no acute conditions requiring further emergency intervention are present at this time. The patient/guardian have been advised of the diagnosis and plan. I reviewed any labs and imaging including any potential incidental findings. We have discussed signs and symptoms that warrant return to the ED and they are listed in the  discharge instructions.    Vital signs are stable at discharge.   BP 121/105 mmHg  Pulse 80  Temp(Src) 97.7 F (36.5 C) (Oral)  Resp 18  SpO2 98%  LMP 12/04/2014  I personally performed the services described in this documentation, which was scribed in my presence. The recorded information has been reviewed and is accurate.   Dahlia Client Vielka Klinedinst, PA-C 12/12/14 0142  Loren Racer, MD 12/12/14 [phone]

## 2014-12-12 ENCOUNTER — Emergency Department (HOSPITAL_COMMUNITY): Payer: Self-pay

## 2014-12-12 IMAGING — DX DG KNEE COMPLETE 4+V*L*
4 series · 4 of 4 positions shown · non-contrast
Comparison: LEFT knee radiographs [DATE]

CLINICAL DATA: Twisting injury, pain and swelling.  No fall.

EXAM:
LEFT KNEE - COMPLETE 4+ VIEW

[knee ap]
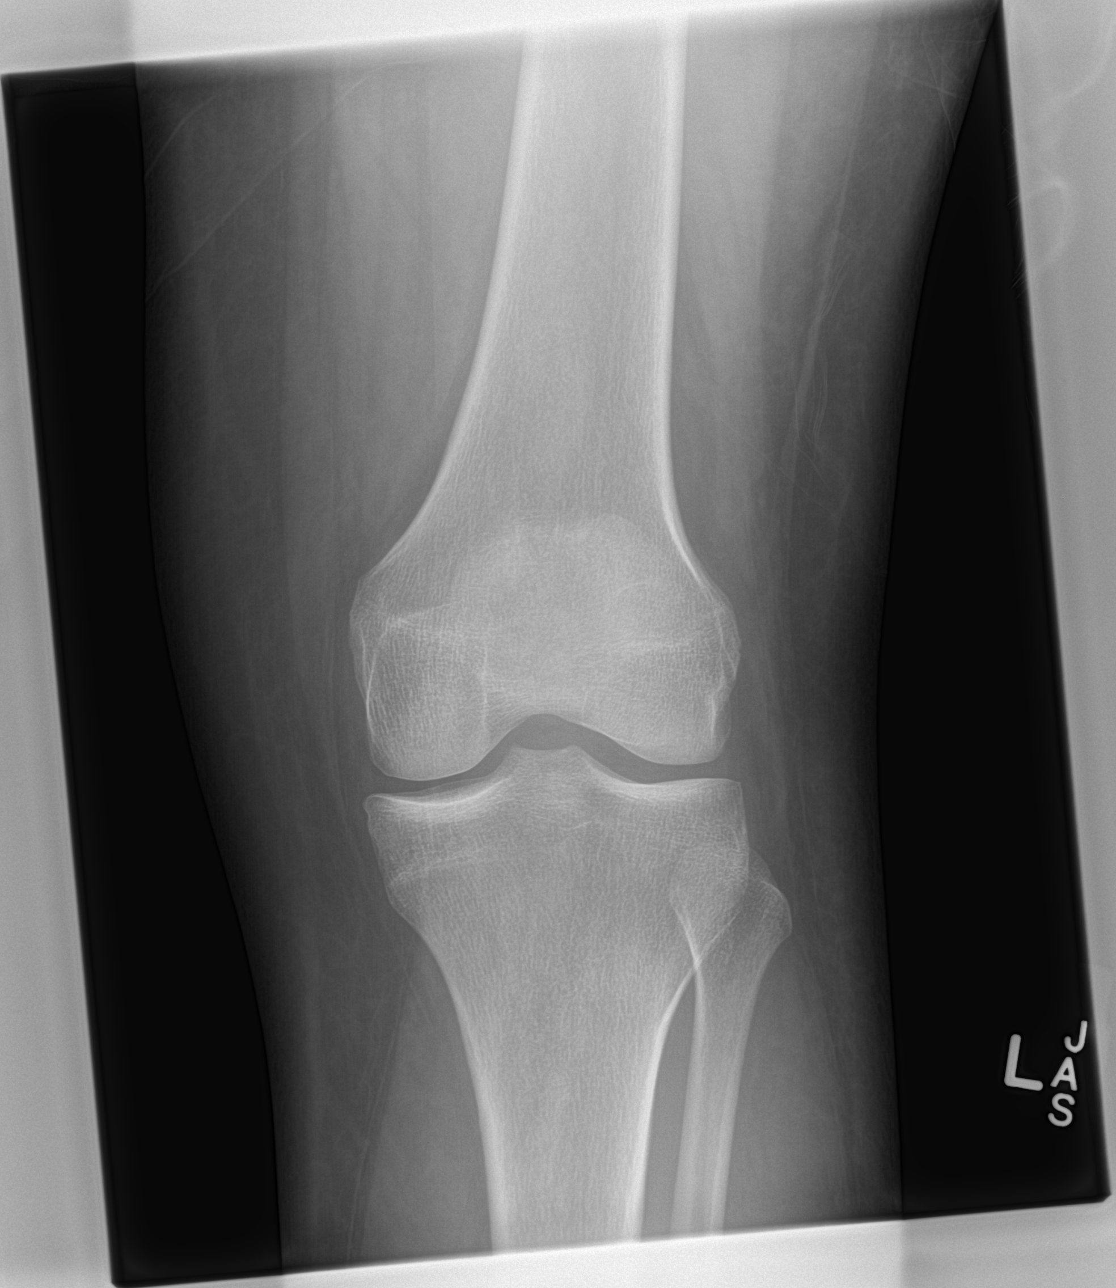

[knee obl (1 of 2)]
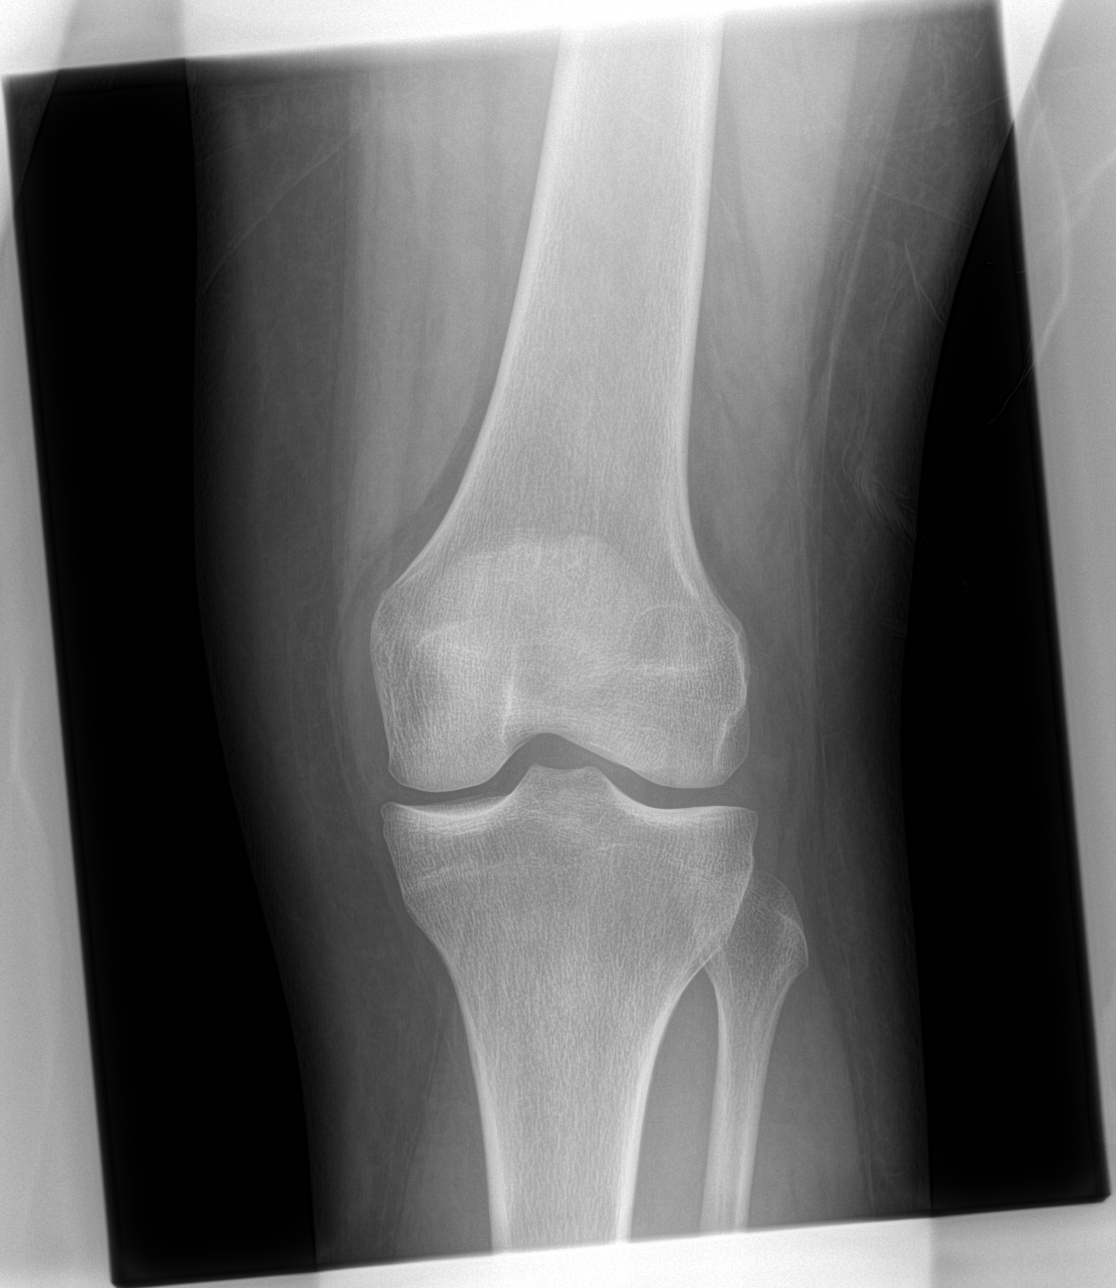

[knee obl (2 of 2)]
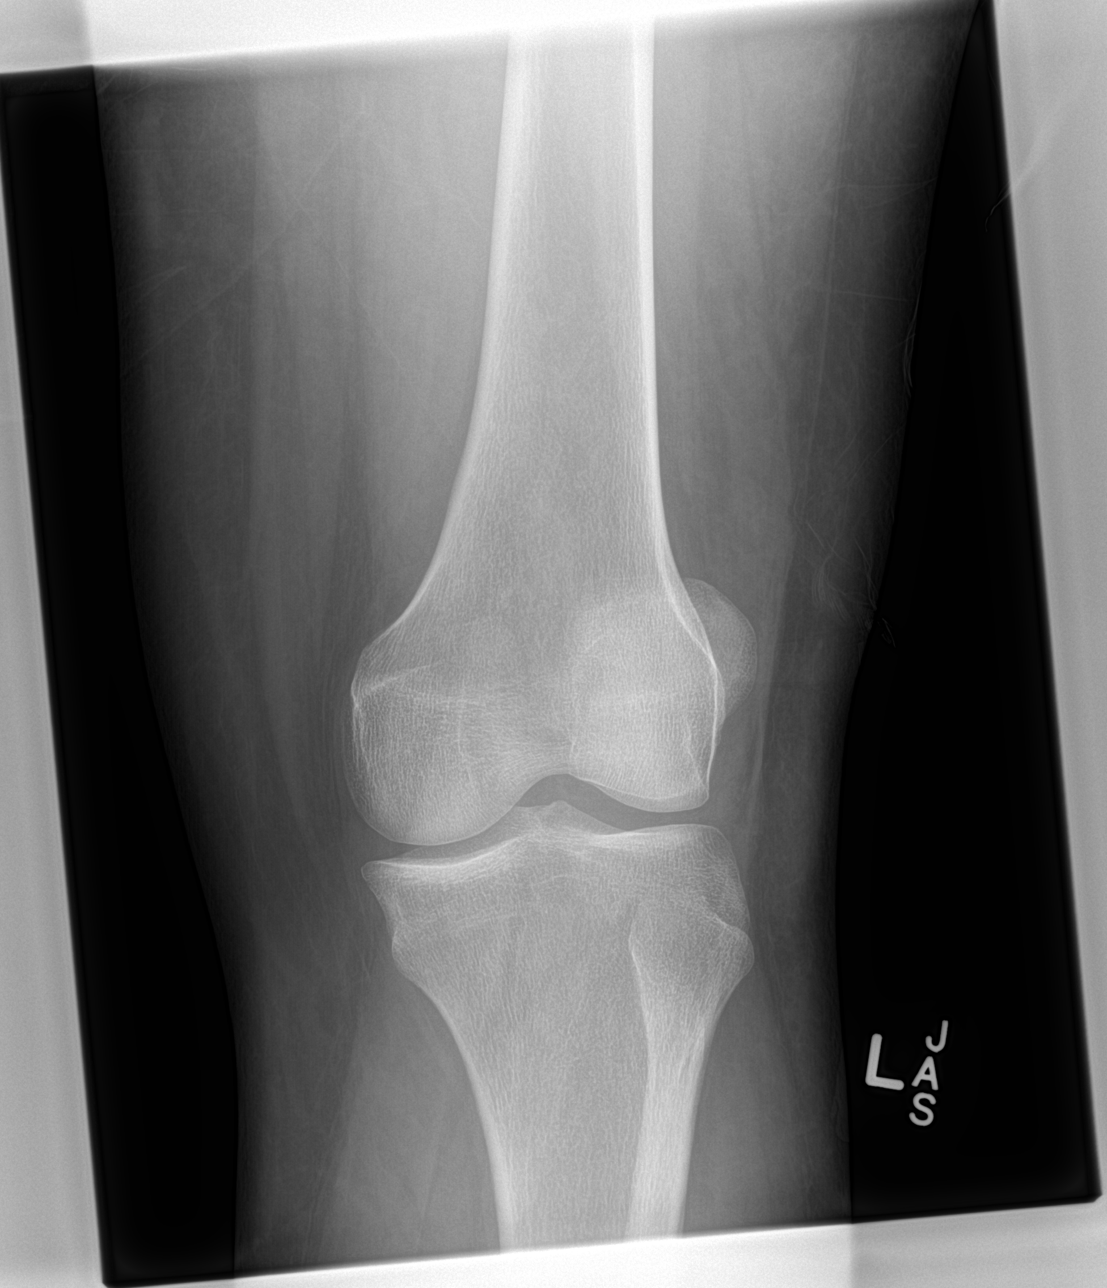

[knee lat]
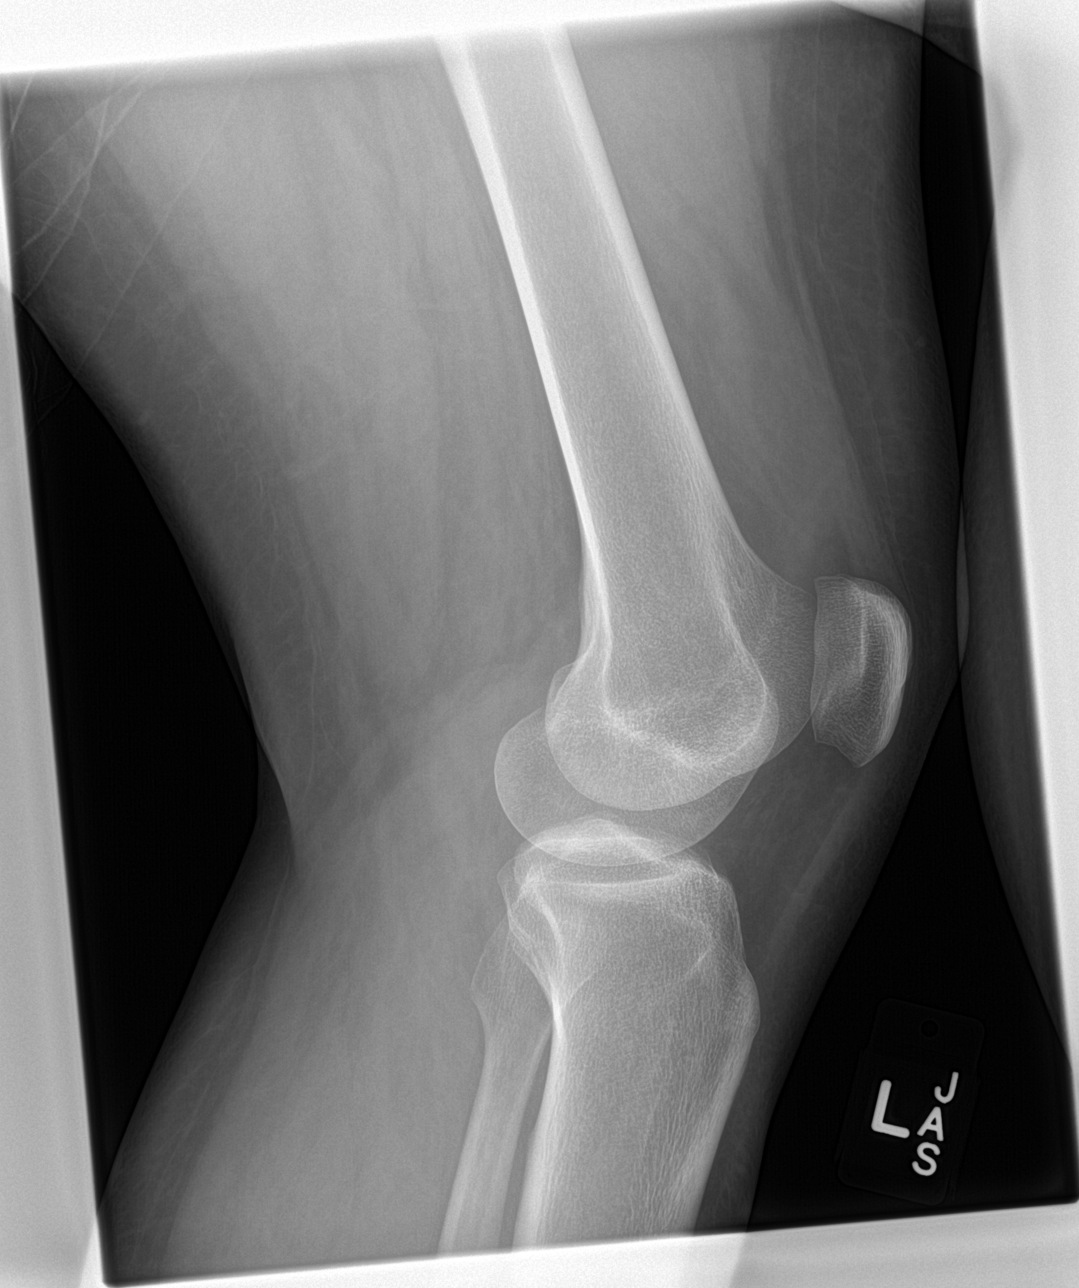

[4 of 4 positions shown; findings below may reference images not displayed]

FINDINGS: There is no evidence of fracture, dislocation, or joint effusion.
There is no evidence of arthropathy or other focal bone abnormality.
Soft tissues are unremarkable.
IMPRESSION: Negative.

  By: BORRELLI

## 2014-12-12 MED ORDER — KETOROLAC TROMETHAMINE 60 MG/2ML IM SOLN
60.0000 mg | Freq: Once | INTRAMUSCULAR | Status: AC
  Administered 2014-12-12: 60 mg via INTRAMUSCULAR
  Filled 2014-12-12: qty 2

## 2014-12-12 MED ORDER — HYDROCODONE-ACETAMINOPHEN 5-325 MG PO TABS: 1.0000 | ORAL_TABLET | Freq: Four times a day (QID) | ORAL | Status: DC | PRN

## 2014-12-12 NOTE — ED Notes (Signed)
Pt st's she was standing and when she put her foot down her left knee gave out.  Pt c/o pain to left knee

## 2014-12-12 NOTE — Discharge Instructions (Signed)
1. Medications: vicodin, usual home medications 2. Treatment: rest, drink plenty of fluids, use brace and crutches, ice, elevate 3. Follow Up: Please followup with orthopedics if not improved in 1 week for discussion of your diagnoses and further evaluation after today's visit; if you do not have a primary care doctor use the resource guide provided to find one;    Arthralgia Your caregiver has diagnosed you as suffering from an arthralgia. Arthralgia means there is pain in a joint. This can come from many reasons including:  Bruising the joint which causes soreness (inflammation) in the joint.  Wear and tear on the joints which occur as we grow older (osteoarthritis).  Overusing the joint.  Various forms of arthritis.  Infections of the joint. Regardless of the cause of pain in your joint, most of these different pains respond to anti-inflammatory drugs and rest. The exception to this is when a joint is infected, and these cases are treated with antibiotics, if it is a bacterial infection. HOME CARE INSTRUCTIONS   Rest the injured area for as long as directed by your caregiver. Then slowly start using the joint as directed by your caregiver and as the pain allows. Crutches as directed may be useful if the ankles, knees or hips are involved. If the knee was splinted or casted, continue use and care as directed. If an stretchy or elastic wrapping bandage has been applied today, it should be removed and re-applied every 3 to 4 hours. It should not be applied tightly, but firmly enough to keep swelling down. Watch toes and feet for swelling, bluish discoloration, coldness, numbness or excessive pain. If any of these problems (symptoms) occur, remove the ace bandage and re-apply more loosely. If these symptoms persist, contact your caregiver or return to this location.  For the first 24 hours, keep the injured extremity elevated on pillows while lying down.  Apply ice for 15-20 minutes to the  sore joint every couple hours while awake for the first half day. Then 03-04 times per day for the first 48 hours. Put the ice in a plastic bag and place a towel between the bag of ice and your skin.  Wear any splinting, casting, elastic bandage applications, or slings as instructed.  Only take over-the-counter or prescription medicines for pain, discomfort, or fever as directed by your caregiver. Do not use aspirin immediately after the injury unless instructed by your physician. Aspirin can cause increased bleeding and bruising of the tissues.  If you were given crutches, continue to use them as instructed and do not resume weight bearing on the sore joint until instructed. Persistent pain and inability to use the sore joint as directed for more than 2 to 3 days are warning signs indicating that you should see a caregiver for a follow-up visit as soon as possible. Initially, a hairline fracture (break in bone) may not be evident on X-rays. Persistent pain and swelling indicate that further evaluation, non-weight bearing or use of the joint (use of crutches or slings as instructed), or further X-rays are indicated. X-rays may sometimes not show a small fracture until a week or 10 days later. Make a follow-up appointment with your own caregiver or one to whom we have referred you. A radiologist (specialist in reading X-rays) may read your X-rays. Make sure you know how you are to obtain your X-ray results. Do not assume everything is normal if you do not hear from [place]. SEEK MEDICAL CARE IF: Bruising, swelling, or pain increases. SEEK  IMMEDIATE MEDICAL CARE IF:   Your fingers or toes are numb or Dann.  The pain is not responding to medications and continues to stay the same or get worse.  The pain in your joint becomes severe.  You develop a fever over 102 F (38.9 C).  It becomes impossible to move or use the joint. MAKE SURE YOU:   Understand these instructions.  Will watch your  condition.  Will get help right away if you are not doing well or get worse. Document Released: 10/16/2005 Document Revised: 01/08/2012 Document Reviewed: 06/03/2008 [facility] Patient Information 2015 [employer] [place]. This information is not intended to replace advice given to you by your health care provider. Make sure you discuss any questions you have with your health care provider.    Emergency Department Resource Guide 1) Find a Doctor and Pay Out of Pocket Although you won't have to find out who is covered by your insurance plan, it is a good idea to ask around and get recommendations. You will then need to call the office and see if the doctor you have chosen will accept you as a new patient and what types of options they offer for patients who are self-pay. Some doctors offer discounts or will set up payment plans for their patients who do not have insurance, but you will need to ask so you aren't surprised when you get to your appointment.  2) Contact Your Local Health Department Not all health departments have doctors that can see patients for sick visits, but many do, so it is worth a call to see if yours does. If you don't know where your local health department is, you can check in your phone book. The CDC also has a tool to help you locate your state's health department, and many state websites also have listings of all of their local health departments.  3) Find a Walk-in Clinic If your illness is not likely to be very severe or complicated, you may want to try a walk in clinic. These are popping up all over the country in pharmacies, drugstores, and shopping centers. They're usually staffed by nurse practitioners or physician assistants that have been trained to treat common illnesses and complaints. They're usually fairly quick and inexpensive. However, if you have serious medical issues or chronic medical problems, these are probably not your best option.  No Primary Care  Doctor: - Call Health Connect at  [phone] - they can help you locate a primary care doctor that  accepts your insurance, provides certain services, etc. - Physician Referral Service- [phone]  Chronic Pain Problems: Organization         Address  Phone   Notes  [facility]  [phone] Patients need to be referred by their primary care doctor.   Medication Assistance: Organization         Address  Phone   Notes  [facility] [address] [place], [place] [id number] [phone] --Must be a resident of [place] -- Must have NO insurance coverage whatsoever (no Medicaid/ Medicare, etc.) -- The pt. MUST have a primary care doctor that directs their care regularly and follows them in the community   Med[facility]ist  [phone]   [employer]  [phone]    Agencies that provide inexpensive medical care: Organization         Address  Phone   Notes  [facility]  [phone]  [facility]    [phone]   [facility] [place], [place] [address] [phone]   [place]. [address], [place] [phone]   Planned Parenthood    [phone]   [facility]    [phone]   [place] and [address], [place] Phone:  [phone], Fax:  [phone] Hours of Operation:  9 am - 6 pm, M-F.  Also accepts Medicaid/Medicare and self-pay.  [facility] for [place] [address] [place], [address], [place] Phone: [phone], Fax: [phone]. Hours of Operation:  8:30 am - 5:30 pm, M-F.  Also accepts Medicaid and self-pay.  [facility] [place] [address] [place] Phone: [phone]   [place], [place], [place] [phone], Ext. [phone] Mondays & Thursdays: 7-9 AM.  First 15 patients are seen on a first  come, first serve basis.    [place] Providers:  Organization         Address  Phone   Notes  [facility] [address], [place] [phone] Also accepts self-pay patients.  [facility] [address], [place]  [phone]   [place], [address], [place] [phone]   [facility] [address], [place] [phone]   Lucianne Lei [address], [place]   [phone] Only accepts [place] Access [place] patients after they have their name applied to their card.   Self-Pay (no insurance) in [facility]:  Organization         Address  Phone   Notes  Sickle Cell Patients, [facility] [place] [phone]   [facility] [place] [phone]   [facility] [place]  [place], [place], [place] [phone]   [facility]/Dr. Osei-Bonsu  [address], [place] [address], [place] [phone] Phone number for both [place] and [place] locations is the same.  [facility] [address] [place] [phone]   [facility] [address] [place] or [address] [phone]   [facility] [address] [place] [phone], phone; [phone], fax Sees patients 1st and 3rd Saturday of every month.  Must not qualify for public or private insurance (i.e. Medicaid, Medicare,  Health Choice, Veterans' Benefits)  Household income should be no more than 200% of the poverty level The clinic cannot treat you if you are pregnant or think you are pregnant  Sexually transmitted diseases are not treated at the clinic.    Dental Care: Organization          Address  Phone  Notes  [facility] [place] [phone] Accepts children up to age 37 who are enrolled in [place] or [place]; pregnant women with a Medicaid card; and children who have applied for Medicaid or  Health Choice, but were declined, whose parents can pay a reduced fee at time of service.  [facility] Department of [facility]  [address],  [place] [phone] Accepts children up to age 97 who are enrolled in Medicaid or [place] Health Choice; pregnant women with a Medicaid card; and children who have applied for Medicaid or [place] Health Choice, but were declined, whose parents can pay a reduced fee at time of service.  [place] Adult [facility] Access PROGRAM  [place] [phone] Patients are seen by appointment only. Walk-ins are not accepted. [place] will see patients 6 years of age and older. Monday - Tuesday (8am-5pm) Most Wednesdays (8:30-5pm) $30 per visit, cash only  [facility] Adult Dental Access PROGRAM  [address], [facility] [phone] Patients are seen by appointment only. Walk-ins are not accepted. [place] will see patients 70 years of age and older. One Wednesday Evening (Monthly: Volunteer Based).  $30 per visit, cash only  [place]  [phone] for adults; Children under age 35, call Graduate Pediatric Dentistry at [phone]. Children aged 36-14, please call [phone] to request a pediatric application.  Dental services are provided in all areas of dental care including fillings, crowns and bridges, complete and partial dentures, implants, gum treatment, root canals, and extractions. Preventive care is also provided. Treatment is provided to both adults and children. Patients are selected via a lottery and there is often a waiting list.   [facility] [address], [place]  [phone] [email or web address]   [facility] [address] [place], [place] [phone], Ext. [phone] Second and Fourth Thursday of each month, opens at 6:30 AM; Clinic ends at 9 AM.  Patients are seen on a first-come first-served basis, and a limited number are seen during each clinic.   [facility]  [address] Hillard Danker [place], [place] [phone]   Eligibility Requirements You must have lived in [place], [place], or [place] counties for at least the last three months.   You cannot be eligible for state or federal sponsored [employer], including [employer], [place], or [employer].   You generally cannot be eligible for healthcare insurance through your employer.    How to apply: Eligibility screenings are held every Tuesday and Wednesday afternoon from 1:00 pm until 4:00 pm. You do not need an appointment for the interview!  [facility] [address] [place], [place]   [place]  [place] Department  [place]  [phone]    Behavioral Health Resources in the Community: Intensive Outpatient Programs Organization         Address  Phone  Notes  [place]. [address] [place], [place] [phone]   [facility] Outpatient [address] [place], [place]   ADS: [facility] [address] [place], [place]   [place] [address]  [place], [place] or [phone]   Substance Abuse Resources Organization         Address  Phone  Notes  Alcohol and Drug Services  [phone]   [place]  [phone]   [facility] [place]   Chinita Pester  [phone]   Residential & Outpatient Substance Abuse Program  [phone]   Psychological  Services Organization         Address  Phone  Notes  [place]  [place]  [phone]  [phone]   [facility] [address] [place] [phone] or [phone]    Mobile Crisis Teams Organization         Address  Phone  Notes  [facility], Mobile Crisis Care Unit  [phone]   [facility]  [address] [place], [place] [phone]   Doristine Locks [address] [place] [phone]    Self-Help/Support Groups Organization         Address  Phone             Notes  [facility] of [place] - variety of support groups  336- I7437963 Call for more information  Narcotics Anonymous (NA), [facility] [address], [employer]  2 meetings at this location   Statistician         Address  Phone  Notes  [facility] [address],    [place]  [phone]   [facility]  [address] [place] [address], [place], [place] [phone]   [facility] [address] [place], [place] [phone] Admissions: 8am-3pm M-F  [facility] [address]    [place], [place] [phone]   [facility] [address] [place], [place], [place] [phone]   [facility] [address]  [place], [place] [phone]   [facility] - Intensive Outpatient [address], [place], [place] [phone]   [facility] ([facility]iction [facility] [address] [place].,  [place], [place] [phone] or [phone]   [facility] ([facility]) [address] [place], [place] [phone] Accepts Medicaid  [facility]ellowship Sobieski [address]  [place] [phone] Substance Abuse/Addiction Treatment   [facility] Organization         Address  Phone  Notes  [facility]  [phone]   Angie Fava, PhD [address] Ervin Knack [place], [place]   [phone] or [phone]   [facility]   [address] [place], [place] [phone]   [facility] [address], [place], [place] [phone] Insurance/Medicaid/sponsorship through [facility] and Families [address]                                    [place], [place] [phone] Therapy/tele-psych/case  [facility] [address], [place] [phone]    Dr. Lolly Mustache  [phone]   [facility] [place]  United Way [facility] Dept. 1) [address] [place] 2) [address] [place] 3)  [address], [place] [phone]  [phone]   [facility] Child Abuse Hotline [phone] or [phone] (After Hours)

## 2016-09-06 ENCOUNTER — Encounter (HOSPITAL_COMMUNITY): Payer: Self-pay | Admitting: Emergency Medicine

## 2016-09-06 ENCOUNTER — Emergency Department (HOSPITAL_COMMUNITY)
Admission: EM | Admit: 2016-09-06 | Discharge: 2016-09-06 | Disposition: A | Discharge status: Home / Self Care | Payer: No Typology Code available for payment source | Attending: Emergency Medicine | Admitting: Emergency Medicine

## 2016-09-06 DIAGNOSIS — S199XXA Unspecified injury of neck, initial encounter: Secondary | ICD-10-CM | POA: Diagnosis present

## 2016-09-06 DIAGNOSIS — S161XXA Strain of muscle, fascia and tendon at neck level, initial encounter: Secondary | ICD-10-CM | POA: Diagnosis not present

## 2016-09-06 DIAGNOSIS — F17200 Nicotine dependence, unspecified, uncomplicated: Secondary | ICD-10-CM | POA: Insufficient documentation

## 2016-09-06 DIAGNOSIS — Y999 Unspecified external cause status: Secondary | ICD-10-CM | POA: Diagnosis not present

## 2016-09-06 DIAGNOSIS — Y939 Activity, unspecified: Secondary | ICD-10-CM | POA: Diagnosis not present

## 2016-09-06 DIAGNOSIS — Y92410 Unspecified street and highway as the place of occurrence of the external cause: Secondary | ICD-10-CM | POA: Diagnosis not present

## 2016-09-06 DIAGNOSIS — S46811A Strain of other muscles, fascia and tendons at shoulder and upper arm level, right arm, initial encounter: Secondary | ICD-10-CM | POA: Diagnosis not present

## 2016-09-06 MED ORDER — IBUPROFEN 400 MG PO TABS: 800.0000 mg | ORAL_TABLET | Freq: Three times a day (TID) | ORAL | 0 refills | Status: AC

## 2016-09-06 MED ORDER — TRAMADOL HCL 50 MG PO TABS: 50.0000 mg | ORAL_TABLET | Freq: Two times a day (BID) | ORAL | 0 refills | Status: DC | PRN

## 2016-09-06 MED ORDER — METHOCARBAMOL 500 MG PO TABS: 500.0000 mg | ORAL_TABLET | Freq: Two times a day (BID) | ORAL | 0 refills | Status: DC
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
## 2016-09-06 NOTE — ED Notes (Signed)
Pt stable, ambulatory, states understanding of discharge instructions 

## 2016-09-28 ENCOUNTER — Encounter (HOSPITAL_COMMUNITY): Payer: Self-pay | Admitting: Vascular Surgery

## 2016-09-28 ENCOUNTER — Emergency Department (HOSPITAL_COMMUNITY)
Admission: EM | Admit: 2016-09-28 | Discharge: 2016-09-28 | Disposition: A | Discharge status: Home / Self Care | Payer: No Typology Code available for payment source | Attending: Emergency Medicine | Admitting: Emergency Medicine

## 2016-09-28 DIAGNOSIS — Y999 Unspecified external cause status: Secondary | ICD-10-CM | POA: Insufficient documentation

## 2016-09-28 DIAGNOSIS — S299XXA Unspecified injury of thorax, initial encounter: Secondary | ICD-10-CM | POA: Diagnosis not present

## 2016-09-28 DIAGNOSIS — Y92410 Unspecified street and highway as the place of occurrence of the external cause: Secondary | ICD-10-CM | POA: Insufficient documentation

## 2016-09-28 DIAGNOSIS — M546 Pain in thoracic spine: Secondary | ICD-10-CM

## 2016-09-28 DIAGNOSIS — Y939 Activity, unspecified: Secondary | ICD-10-CM | POA: Insufficient documentation

## 2016-09-28 DIAGNOSIS — F17210 Nicotine dependence, cigarettes, uncomplicated: Secondary | ICD-10-CM | POA: Diagnosis not present

## 2016-09-28 MED ORDER — METHOCARBAMOL 500 MG PO TABS: 500.0000 mg | ORAL_TABLET | Freq: Two times a day (BID) | ORAL | 0 refills | Status: DC

## 2016-09-28 MED ORDER — TRAMADOL HCL 50 MG PO TABS: 50.0000 mg | ORAL_TABLET | Freq: Four times a day (QID) | ORAL | 0 refills | Status: DC | PRN

## 2016-09-28 MED ORDER — IBUPROFEN 600 MG PO TABS: 600.0000 mg | ORAL_TABLET | Freq: Four times a day (QID) | ORAL | 0 refills | Status: DC | PRN

## 2016-09-28 NOTE — Discharge Instructions (Signed)
Take your medications as prescribed. I also recommend applying ice and/or heat to affected area for 15-20 minutes 3-4 times daily for additional pain relief. Refrain from doing any heavy lifting, squatting or repetitive movements that exacerbate your symptoms. Please follow up with a primary care provider from the Resource Guide provided below in one week if your symptoms have not improved. Please return to the Emergency Department if symptoms worsen or new onset of fever, numbness, tingling, groin anesthesia, loss of bowel or bladder, weakness.
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
## 2016-10-27 ENCOUNTER — Emergency Department (HOSPITAL_COMMUNITY)
Admission: EM | Admit: 2016-10-27 | Discharge: 2016-10-27 | Disposition: A | Discharge status: Home / Self Care | Payer: 59 | Attending: Emergency Medicine | Admitting: Emergency Medicine

## 2016-10-27 ENCOUNTER — Encounter (HOSPITAL_COMMUNITY): Payer: Self-pay

## 2016-10-27 DIAGNOSIS — R112 Nausea with vomiting, unspecified: Secondary | ICD-10-CM | POA: Diagnosis not present

## 2016-10-27 DIAGNOSIS — R197 Diarrhea, unspecified: Secondary | ICD-10-CM | POA: Diagnosis not present

## 2016-10-27 DIAGNOSIS — F17210 Nicotine dependence, cigarettes, uncomplicated: Secondary | ICD-10-CM | POA: Insufficient documentation

## 2016-10-27 LAB — I-STAT CHEM 8, ED
BUN: 9 mg/dL (ref 6–20)
CALCIUM ION: 1.16 mmol/L (ref 1.15–1.40)
CREATININE: 0.8 mg/dL (ref 0.44–1.00)
Chloride: 107 mmol/L (ref 101–111)
GLUCOSE: 92 mg/dL (ref 65–99)
HCT: 41 % (ref 36.0–46.0)
HEMOGLOBIN: 13.9 g/dL (ref 12.0–15.0)
Potassium: 4.2 mmol/L (ref 3.5–5.1)
Sodium: 141 mmol/L (ref 135–145)
TCO2: 23 mmol/L (ref 0–100)

## 2016-10-27 MED ORDER — ONDANSETRON HCL 4 MG PO TABS: 4.0000 mg | ORAL_TABLET | Freq: Four times a day (QID) | ORAL | 0 refills | Status: DC

## 2016-10-27 MED ORDER — ONDANSETRON 4 MG PO TBDP
4.0000 mg | ORAL_TABLET | Freq: Once | ORAL | Status: AC
  Administered 2016-10-27: 4 mg via ORAL
  Filled 2016-10-27: qty 1

## 2016-10-27 NOTE — ED Triage Notes (Signed)
Pt presents with onset of diarrhea since last night.  Pt ate [employer] at 1700 with onset of vomiting x 2 followed by diarrhea.  Reports abdominal pain.  Husband with same symptoms.

## 2016-10-27 NOTE — Discharge Instructions (Signed)

## 2016-10-27 NOTE — ED Provider Notes (Signed)
Emergency Department Provider Note   I have reviewed the triage vital signs and the nursing notes.   HISTORY  Chief Complaint Emesis   HPI Ariana Rogers is a 30 y.o. female with on significant PMH presents to the emergency department for evaluation of sudden onset nausea, vomiting, diarrhea. Symptoms started shortly after eating fast food yesterday evening. The patient states that her husband is also sick. She has some mild diffuse abdominal discomfort. Pain is made worse with eating. The patient denies any chest pain or difficulty breathing. No fevers. No dysuria. She has not taken any medication for discomfort or nausea at home.   History reviewed. No pertinent past medical history.  There are no active problems to display for this patient.   Past Surgical History:  Procedure Laterality Date  . DENTAL SURGERY    . TUBAL LIGATION  2 years ago    Current Outpatient Rx  . Order #: [id number] Class: Print  . Order #: [id number] Class: Print  . Order #: [id number] Class: Print  . Order #: [id number] Class: Print  . Order #: [id number] Class: Print  . Order #: [id number] Class: Print  . Order #: [id number] Class: Print    Allergies Patient has no known allergies.  History reviewed. No pertinent family history.  Social History Social History  Substance Use Topics  . Smoking status: Current Every Day Smoker    Packs/day: 0.50    Types: Cigarettes  . Smokeless tobacco: Never Used  . Alcohol use Yes     Comment: occasionally    Review of Systems  Constitutional: No fever/chills Eyes: No visual changes. ENT: No sore throat. Cardiovascular: Denies chest pain. Respiratory: Denies shortness of breath. Gastrointestinal: Positive diffuse abdominal pain. Positive nausea, vomiting, and diarrhea.  No constipation. Genitourinary: Negative for dysuria. Musculoskeletal: Negative for back pain. Skin: Negative for rash. Neurological: Negative for headaches, focal weakness or  numbness.  10-point ROS otherwise negative.  ____________________________________________   PHYSICAL EXAM:  VITAL SIGNS: ED Triage Vitals  Enc Vitals Group     BP 10/27/16 0802 120/83     Pulse Rate 10/27/16 0802 74     Resp 10/27/16 0802 18     Temp 10/27/16 0802 97.8 F (36.6 C)     Temp Source 10/27/16 0802 Oral     SpO2 10/27/16 0802 98 %     Pain Score 10/27/16 0805 8   Constitutional: Alert and oriented. Well appearing and in no acute distress. Eyes: Conjunctivae are normal.  Head: Atraumatic. Nose: No congestion/rhinnorhea. Mouth/Throat: Mucous membranes are moist.  Oropharynx non-erythematous. Neck: No stridor.   Cardiovascular: Normal rate, regular rhythm. Good peripheral circulation. Grossly normal heart sounds.   Respiratory: Normal respiratory effort.  No retractions. Lungs CTAB. Gastrointestinal: Soft and nontender. No distention.  Musculoskeletal: No lower extremity tenderness nor edema. No gross deformities of extremities. Neurologic:  Normal speech and language. No gross focal neurologic deficits are appreciated.  Skin:  Skin is warm, dry and intact. No rash noted.  ____________________________________________   LABS (all labs ordered are listed, but only abnormal results are displayed)  Labs Reviewed  I-STAT CHEM 8, ED   ____________________________________________   PROCEDURES  Procedure(s) performed:   Procedures  None ____________________________________________   INITIAL IMPRESSION / ASSESSMENT AND PLAN / ED COURSE  Pertinent labs & imaging results that were available during my care of the patient were reviewed by me and considered in my medical decision making (see chart for details).  Patient resents emergency pertinent for evaluation of nausea, vomiting, diarrhea. Her  abdomen is soft and nontender to palpation. No fevers or chills. Patient is well-appearing. She is feeling better after Zofran. I-STAT Chem-8 is negative for acute  alleged laterality. She has another person in the home who is sick after eating similar food. Plan for discharge with Zofran and primary care physician referral.   At this time, I do not feel there is any life-threatening condition present. I have reviewed and discussed all results (EKG, imaging, lab, urine as appropriate), exam findings with patient. I have reviewed nursing notes and appropriate previous records.  I feel the patient is safe to be discharged home without further emergent workup. Discussed usual and customary return precautions. Patient and family (if present) verbalize understanding and are comfortable with this plan.  Patient will follow-up with their primary care provider. If they do not have a primary care provider, information for follow-up has been provided to them. All questions have been answered.  ____________________________________________  FINAL CLINICAL IMPRESSION(S) / ED DIAGNOSES  Final diagnoses:  Nausea vomiting and diarrhea     MEDICATIONS GIVEN DURING THIS VISIT:  Medications  ondansetron (ZOFRAN-ODT) disintegrating tablet 4 mg (4 mg Oral Given 10/27/16 0838)     NEW OUTPATIENT MEDICATIONS STARTED DURING THIS VISIT:  New Prescriptions   ONDANSETRON (ZOFRAN) 4 MG TABLET    Take 1 tablet (4 mg total) by mouth every 6 (six) hours.      Note:  This document was prepared using Dragon voice recognition software and may include unintentional dictation errors.  Alona BeneJoshua Long, MD Emergency Medicine   Maia PlanJoshua G Long, MD 10/27/16 [phone]

## 2017-03-18 ENCOUNTER — Encounter (HOSPITAL_COMMUNITY): Payer: Self-pay

## 2017-03-18 ENCOUNTER — Emergency Department (HOSPITAL_COMMUNITY)
Admission: EM | Admit: 2017-03-18 | Discharge: 2017-03-18 | Disposition: A | Discharge status: Home / Self Care | Payer: 59 | Attending: Emergency Medicine | Admitting: Emergency Medicine

## 2017-03-18 DIAGNOSIS — Z79899 Other long term (current) drug therapy: Secondary | ICD-10-CM | POA: Diagnosis not present

## 2017-03-18 DIAGNOSIS — L818 Other specified disorders of pigmentation: Secondary | ICD-10-CM | POA: Diagnosis not present

## 2017-03-18 DIAGNOSIS — L0889 Other specified local infections of the skin and subcutaneous tissue: Secondary | ICD-10-CM | POA: Insufficient documentation

## 2017-03-18 DIAGNOSIS — M79605 Pain in left leg: Secondary | ICD-10-CM | POA: Diagnosis present

## 2017-03-18 DIAGNOSIS — L923 Foreign body granuloma of the skin and subcutaneous tissue: Secondary | ICD-10-CM

## 2017-03-18 DIAGNOSIS — F17210 Nicotine dependence, cigarettes, uncomplicated: Secondary | ICD-10-CM | POA: Insufficient documentation

## 2017-03-18 DIAGNOSIS — L089 Local infection of the skin and subcutaneous tissue, unspecified: Secondary | ICD-10-CM

## 2017-03-18 MED ORDER — SULFAMETHOXAZOLE-TRIMETHOPRIM 800-160 MG PO TABS: 1.0000 | ORAL_TABLET | Freq: Two times a day (BID) | ORAL | 0 refills | Status: AC

## 2017-03-18 MED ORDER — SULFAMETHOXAZOLE-TRIMETHOPRIM 800-160 MG PO TABS
1.0000 | ORAL_TABLET | Freq: Once | ORAL | Status: AC
  Administered 2017-03-18: 1 via ORAL
  Filled 2017-03-18: qty 1

## 2017-03-18 NOTE — ED Notes (Signed)
Pt stable, ambulatory, states understanding of discharge instructions 

## 2017-03-18 NOTE — ED Triage Notes (Signed)
Pt states new tattoo to L lower leg yesterday. Pt complaining of new onset redness and swelling today. Pt states painful to walk. Pt with some redness to leg. Pt denies any fevers.

## 2017-03-18 NOTE — ED Provider Notes (Signed)
[facility]-EMERGENCY DEPT Provider Note   CSN: [id number] Arrival date & time: 03/18/17  2028  By signing my name below, I, Ariana Rogers, attest that this documentation has been prepared under the direction and in the presence of [facility]. Damian LeavellNeese, FNP.  Electronically Signed: Diona BrownerJennifer Rogers, ED Scribe. 03/18/17. 9:37 PM.  History   Chief Complaint Chief Complaint  Patient presents with  . Leg Pain    HPI Ariana LocketShanon Rogers is a 31 y.o. female who presents to the Emergency Department complaining of gradually worsening, swelling to left lower leg that started yesterday (03/17/17). Pt reports she got a new tattoo yesterday. She went outside without it covered up and also used a rag to clean the area. Associated sx include tiny bumps, erythema and pain with ambulation. Pt denies fever, and red streaking.  The history is provided by the patient. No language interpreter was used.  Leg Pain   This is a new problem. The current episode started yesterday. The problem occurs constantly. The problem has been gradually worsening. The pain is present in the left lower leg. The pain is moderate. The symptoms are aggravated by standing. History of extremity trauma: tattoo.    History reviewed. No pertinent past medical history.  There are no active problems to display for this patient.   Past Surgical History:  Procedure Laterality Date  . DENTAL SURGERY    . TUBAL LIGATION  2 years ago    OB History    No data available       Home Medications    Prior to Admission medications   Medication Sig Start Date End Date Taking? Authorizing Provider  HYDROcodone-acetaminophen (NORCO/VICODIN) 5-325 MG per tablet Take 1-2 tablets by mouth every 6 (six) hours as needed for moderate pain or severe pain. 12/12/14   Muthersbaugh, Dahlia ClientHannah, PA-C  ibuprofen (ADVIL,MOTRIN) 600 MG tablet Take 1 tablet (600 mg total) by mouth every 6 (six) hours as needed. 09/28/16   Barrett HenleNadeau, Nicole Elizabeth, PA-C  methocarbamol  (ROBAXIN) 500 MG tablet Take 1 tablet (500 mg total) by mouth 2 (two) times daily. 09/28/16   Barrett HenleNadeau, Nicole Elizabeth, PA-C  ondansetron (ZOFRAN) 4 MG tablet Take 1 tablet (4 mg total) by mouth every 6 (six) hours. 10/27/16   Long, Arlyss RepressJoshua G, MD  sulfamethoxazole-trimethoprim (BACTRIM DS,SEPTRA DS) 800-160 MG tablet Take 1 tablet by mouth 2 (two) times daily. 03/18/17 03/25/17  Janne NapoleonNeese, Marshelle Bilger M, NP  traMADol (ULTRAM) 50 MG tablet Take 1 tablet (50 mg total) by mouth every 6 (six) hours as needed. 09/28/16   Barrett HenleNadeau, Nicole Elizabeth, PA-C    Family History History reviewed. No pertinent family history.  Social History Social History  Substance Use Topics  . Smoking status: Current Every Day Smoker    Packs/day: 0.50    Types: Cigarettes  . Smokeless tobacco: Never Used  . Alcohol use Yes     Comment: occasionally     Allergies   Patient has no known allergies.   Review of Systems Review of Systems  Constitutional: Negative for fever.  Gastrointestinal: Negative for nausea and vomiting.  Skin: Positive for wound (tiny bumps).       + Left lower leg edema.  + Erythema. - Red streaking.     Physical Exam Updated Vital Signs BP 127/75   Pulse 87   Temp 98.2 F (36.8 C) (Oral)   Resp 17   Ht 5' 5.75" (1.67 m)   Wt 247 lb 12.8 oz (112.4 kg)   LMP 02/27/2017 (Approximate)  SpO2 99%   BMI 40.30 kg/m   Physical Exam  Constitutional: She is oriented to person, place, and time. She appears well-developed and well-nourished.  Eyes: EOM are normal.  Neck: Neck supple.  Pulmonary/Chest: Effort normal.  Abdominal: Soft. There is no tenderness.  Musculoskeletal: Normal range of motion.  Good pulses.  Good circulation. Tiny blisters are noted at the tattoo site.  Swelling to the lateral aspect of the ankle.  Mild erythema surrounding the tattoo.  Neurological: She is alert and oriented to person, place, and time. No cranial nerve deficit.  Skin: Skin is warm and dry.    Nursing note and vitals reviewed.    ED Treatments / Results  DIAGNOSTIC STUDIES: Oxygen Saturation is 99% on RA, normal by my interpretation.   COORDINATION OF CARE: 9:34 PM-Discussed next steps with pt. Pt verbalized understanding and is agreeable with the plan.    Labs (all labs ordered are listed, but only abnormal results are displayed) Labs Reviewed - No data to display   Radiology No results found.  Procedures Procedures (including critical care time)  Medications Ordered in ED Medications  sulfamethoxazole-trimethoprim (BACTRIM DS,SEPTRA DS) 800-160 MG per tablet 1 tablet (1 tablet Oral Given 03/18/17 2148)     Initial Impression / Assessment and Plan / ED Course  I have reviewed the triage vital signs and the nursing notes.  Final Clinical Impressions(s) / ED Diagnoses  31 y.o. female with pain and swelling to the left lower leg s/p new tattoo yesterday. Stable for d/c without fever or red streaking. Will start antibiotics and patient will return for worsening symptoms.  Final diagnoses:  Skin infection  Tattoo reaction    New Prescriptions Discharge Medication List as of 03/18/2017  9:44 PM    I personally performed the services described in this documentation, which was scribed in my presence. The recorded information has been reviewed and is accurate.     Kerrie Buffalo Matthews, [place] 03/19/17 1610    Jacalyn Lefevre, MD 03/20/17 1210

## 2017-05-25 ENCOUNTER — Encounter (HOSPITAL_COMMUNITY): Payer: Self-pay | Admitting: *Deleted

## 2017-05-25 ENCOUNTER — Emergency Department (HOSPITAL_COMMUNITY)
Admission: EM | Admit: 2017-05-25 | Discharge: 2017-05-25 | Disposition: A | Discharge status: Home / Self Care | Payer: 59 | Attending: Emergency Medicine | Admitting: Emergency Medicine

## 2017-05-25 DIAGNOSIS — R05 Cough: Secondary | ICD-10-CM | POA: Insufficient documentation

## 2017-05-25 DIAGNOSIS — F17210 Nicotine dependence, cigarettes, uncomplicated: Secondary | ICD-10-CM | POA: Diagnosis not present

## 2017-05-25 DIAGNOSIS — J00 Acute nasopharyngitis [common cold]: Secondary | ICD-10-CM | POA: Diagnosis not present

## 2017-05-25 DIAGNOSIS — R0981 Nasal congestion: Secondary | ICD-10-CM | POA: Diagnosis present

## 2017-05-25 MED ORDER — IBUPROFEN 600 MG PO TABS: 600.0000 mg | ORAL_TABLET | Freq: Four times a day (QID) | ORAL | 0 refills | Status: DC | PRN

## 2017-05-25 MED ORDER — BENZONATATE 100 MG PO CAPS: 100.0000 mg | ORAL_CAPSULE | Freq: Three times a day (TID) | ORAL | 0 refills | Status: DC

## 2017-05-25 MED ORDER — FLUTICASONE PROPIONATE 50 MCG/ACT NA SUSP: 1.0000 | Freq: Every day | NASAL | 0 refills | Status: DC

## 2017-05-25 NOTE — ED Notes (Signed)
Family at bedside. 

## 2017-05-25 NOTE — ED Provider Notes (Signed)
[facility]EMER[facility]ENCY DEPT Provider Note   CSN: [id number] Arrival date & time: 05/25/17  [id number]   By signing my name below, I, Clarisse GougeXavier Herndon, attest that this documentation has been prepared under the direction and in the presence of Terance HartKelly Zanetta Dehaan, PA-C. Electronically signed, Clarisse GougeXavier Herndon, ED Scribe. 05/25/17. 10:17 AM.   History   Chief Complaint Chief Complaint  Patient presents with  . Nasal Congestion  . Cough   The history is provided by the patient and medical records. No language interpreter was used.    Ariana Rogers is a 31 y.o. female presenting to the Emergency Department concerning cough productive of green sputum onset yesterday. Associated chest congestion, posterior ear pain, nasal congestion, dry mouth, postnasal congestion, anterior neck and bilaterall jaw soreness, nausea and chest soreness d/t cough. 6/10, intermittent aching described in the chest. She states this pain is most severe during coughing spells. No PTA medications. Pt states her children are also sick with similar symptoms. No vomiting or any other complaints noted at this time.   History reviewed. No pertinent past medical history.  There are no active problems to display for this patient.   Past Surgical History:  Procedure Laterality Date  . DENTAL SURGERY    . TUBAL LIGATION  2 years ago    OB History    No data available       Home Medications    Prior to Admission medications   Medication Sig Start Date End Date Taking? Authorizing Provider  HYDROcodone-acetaminophen (NORCO/VICODIN) 5-325 MG per tablet Take 1-2 tablets by mouth every 6 (six) hours as needed for moderate pain or severe pain. 12/12/14   Muthersbaugh, Dahlia ClientHannah, PA-C  ibuprofen (ADVIL,MOTRIN) 600 MG tablet Take 1 tablet (600 mg total) by mouth every 6 (six) hours as needed. 09/28/16   Barrett HenleNadeau, Nicole Elizabeth, PA-C  methocarbamol (ROBAXIN) 500 MG tablet Take 1 tablet (500 mg total) by mouth 2 (two) times daily. 09/28/16   Barrett HenleNadeau,  Nicole Elizabeth, PA-C  ondansetron (ZOFRAN) 4 MG tablet Take 1 tablet (4 mg total) by mouth every 6 (six) hours. 10/27/16   Long, Arlyss RepressJoshua G, MD  traMADol (ULTRAM) 50 MG tablet Take 1 tablet (50 mg total) by mouth every 6 (six) hours as needed. 09/28/16   Barrett HenleNadeau, Nicole Elizabeth, PA-C    Family History No family history on file.  Social History Social History  Substance Use Topics  . Smoking status: Current Every Day Smoker    Packs/day: 0.50    Types: Cigarettes  . Smokeless tobacco: Never Used  . Alcohol use Yes     Comment: occasionally     Allergies   Patient has no known allergies.   Review of Systems Review of Systems  Constitutional: Positive for appetite change (dry mouth/ increased thirst) and fatigue. Negative for fever.  HENT: Positive for congestion, ear pain, postnasal drip and rhinorrhea. Negative for sore throat and trouble swallowing.   Respiratory: Positive for cough.   Cardiovascular: Positive for chest pain (d/t cough).  Gastrointestinal: Positive for nausea. Negative for abdominal pain, diarrhea and vomiting.  Musculoskeletal: Negative for arthralgias and myalgias.  Allergic/Immunologic: Negative for immunocompromised state.  Hematological: Negative for adenopathy.     Physical Exam Updated Vital Signs BP (!) 125/96 (BP Location: Right Arm)   Pulse 75   Temp 98.3 F (36.8 C) (Oral)   Resp 16   LMP 05/03/2017 (Approximate)   SpO2 100%   Physical Exam  Constitutional: She is oriented to person, place, and time. She appears  well-developed and well-nourished. No distress.  HENT:  Head: Normocephalic and atraumatic.  Right Ear: Hearing, tympanic membrane, external ear and ear canal normal.  Left Ear: Hearing, tympanic membrane, external ear and ear canal normal.  Nose: Nose normal.  Mouth/Throat: Uvula is midline, oropharynx is clear and moist and mucous membranes are normal.  Eyes: Pupils are equal, round, and reactive to light. Conjunctivae and  EOM are normal. Right eye exhibits no discharge. Left eye exhibits no discharge. No scleral icterus.  Neck: Normal range of motion.  Cardiovascular: Normal rate and regular rhythm.  Exam reveals no gallop and no friction rub.   No murmur heard. Pulmonary/Chest: Effort normal and breath sounds normal. No respiratory distress. She has no wheezes. She has no rales. She exhibits no tenderness.  Abdominal: She exhibits no distension.  Musculoskeletal: Normal range of motion.  Neurological: She is alert and oriented to person, place, and time.  Skin: Skin is warm and dry.  Psychiatric: She has a normal mood and affect. Her behavior is normal.  Nursing note and vitals reviewed.    ED Treatments / Results  DIAGNOSTIC STUDIES: Oxygen Saturation is 100% on RA, NL by my interpretation.    COORDINATION OF CARE: 10:16 AM-Discussed next steps with pt. Pt verbalized understanding and is agreeable with the plan. Will Rx medications. Pt prepared for d/c, advised of symptomatic care at home, F/U instructions and return precautions.    Labs (all labs ordered are listed, but only abnormal results are displayed) Labs Reviewed - No data to display  EKG  EKG Interpretation None       Radiology No results found.  Procedures Procedures (including critical care time)  Medications Ordered in ED Medications - No data to display   Initial Impression / Assessment and Plan / ED Course  I have reviewed the triage vital signs and the nursing notes.  Pertinent labs & imaging results that were available during my care of the patient were reviewed by me and considered in my medical decision making (see chart for details).  31 year old with URI and cough. Vitals are normal. Discussed with her that etiology is likely viral and supportive care is indicated for this. Will d/c with cough medicine, NSAIDs, Flonase and advised rest and fluids. Return precautions given.  Final Clinical Impressions(s) / ED  Diagnoses   Final diagnoses:  Acute nasopharyngitis    New Prescriptions New Prescriptions   No medications on file   I personally performed the services described in this documentation, which was scribed in my presence. The recorded information has been reviewed and is accurate.    Bethel BornGekas, Regenia Erck Marie, PA-C 05/25/17 1455    Jacalyn LefevreHaviland, Julie, MD 05/25/17 [phone]

## 2017-05-25 NOTE — Discharge Instructions (Signed)
Please drink plenty of fluids.  Take Ibuprofen for pain/fever Take Tessalon for cough Use Flonase for congestion Return to the ED for worsening symptoms

## 2017-05-25 NOTE — ED Triage Notes (Signed)
Pt c/o sore throat & bil ear pain & productive cough with green sputum onset yesterday, pt afebrile in triage, reports chest pain with cough, A&O x4

## 2017-05-27 ENCOUNTER — Emergency Department (HOSPITAL_COMMUNITY)
Admission: EM | Admit: 2017-05-27 | Discharge: 2017-05-27 | Disposition: A | Discharge status: Home / Self Care | Payer: 59 | Attending: Emergency Medicine | Admitting: Emergency Medicine

## 2017-05-27 ENCOUNTER — Encounter (HOSPITAL_COMMUNITY): Payer: Self-pay

## 2017-05-27 DIAGNOSIS — Z79899 Other long term (current) drug therapy: Secondary | ICD-10-CM | POA: Insufficient documentation

## 2017-05-27 DIAGNOSIS — R42 Dizziness and giddiness: Secondary | ICD-10-CM | POA: Diagnosis present

## 2017-05-27 DIAGNOSIS — J0100 Acute maxillary sinusitis, unspecified: Secondary | ICD-10-CM | POA: Diagnosis not present

## 2017-05-27 DIAGNOSIS — F17210 Nicotine dependence, cigarettes, uncomplicated: Secondary | ICD-10-CM | POA: Diagnosis not present

## 2017-05-27 LAB — CBG MONITORING, ED: Glucose-Capillary: 138 mg/dL — ABNORMAL HIGH (ref 65–99)

## 2017-05-27 LAB — CBC
HEMATOCRIT: 43 % (ref 36.0–46.0)
Hemoglobin: 14.6 g/dL (ref 12.0–15.0)
MCH: 28.5 pg (ref 26.0–34.0)
MCHC: 34 g/dL (ref 30.0–36.0)
MCV: 84 fL (ref 78.0–100.0)
Platelets: 245 10*3/uL (ref 150–400)
RBC: 5.12 MIL/uL — ABNORMAL HIGH (ref 3.87–5.11)
RDW: 13.4 % (ref 11.5–15.5)
WBC: 15.3 10*3/uL — ABNORMAL HIGH (ref 4.0–10.5)

## 2017-05-27 LAB — BASIC METABOLIC PANEL
Anion gap: 7 (ref 5–15)
BUN: 11 mg/dL (ref 6–20)
CHLORIDE: 110 mmol/L (ref 101–111)
CO2: 23 mmol/L (ref 22–32)
Calcium: 8.7 mg/dL — ABNORMAL LOW (ref 8.9–10.3)
Creatinine, Ser: 0.76 mg/dL (ref 0.44–1.00)
GFR calc Af Amer: >60 mL/min (ref >60)
GFR calc non Af Amer: >60 mL/min (ref >60)
Glucose, Bld: 133 mg/dL — ABNORMAL HIGH (ref 65–99)
POTASSIUM: 3.9 mmol/L (ref 3.5–5.1)
Sodium: 140 mmol/L (ref 135–145)

## 2017-05-27 MED ORDER — METHOCARBAMOL 500 MG PO TABS: 500.0000 mg | ORAL_TABLET | Freq: Two times a day (BID) | ORAL | 0 refills | Status: DC

## 2017-05-27 MED ORDER — DOXYCYCLINE HYCLATE 100 MG PO CAPS: 100.0000 mg | ORAL_CAPSULE | Freq: Two times a day (BID) | ORAL | 0 refills | Status: AC

## 2017-05-27 NOTE — ED Notes (Signed)
Writer asked pt if she was able to urinate at this time, pt sts no, but she will let us know

## 2017-05-27 NOTE — ED Notes (Signed)
CBG is 138.

## 2017-05-27 NOTE — Discharge Instructions (Signed)
Please read and follow all provided instructions.  Your diagnoses today include:  1. Acute non-recurrent maxillary sinusitis     Tests performed today include: Vital signs. See below for your results today.   Medications prescribed:  Take as prescribed   Home care instructions:  Follow any educational materials contained in this packet.  Follow-up instructions: Please follow-up with your primary care provider for further evaluation of symptoms and treatment   Return instructions:  Please return to the Emergency Department if you do not get better, if you get worse, or new symptoms OR  - Fever (temperature greater than 101.46F)  - Bleeding that does not stop with holding pressure to the area    -Severe pain (please note that you may be more sore the day after your accident)  - Chest Pain  - Difficulty breathing  - Severe nausea or vomiting  - Inability to tolerate food and liquids  - Passing out  - Skin becoming red around your wounds  - Change in mental status (confusion or lethargy)  - New numbness or weakness    Please return if you have any other emergent concerns.  Additional Information:  Your vital signs today were: BP 128/73 (BP Location: Right Arm)    Pulse 89    Temp 98.4 F (36.9 C) (Oral)    Resp 18    Ht 5' 5.75" (1.67 m)    Wt 113.5 kg (250 lb 4.8 oz)    LMP 05/03/2017 (Approximate)    SpO2 100%    BMI 40.71 kg/m  If your blood pressure (BP) was elevated above 135/85 this visit, please have this repeated by your doctor within one month. ---------------

## 2017-05-27 NOTE — ED Provider Notes (Signed)
[facility] Provider Note   CSN: [id number] Arrival date & time: 05/27/17  1746     History   Chief Complaint Chief Complaint  Patient presents with  . Dizziness    HPI Ariana Rogers is a 31 y.o. female.  HPI  [age] y.o. female, presents to the Emergency Department today due to x 2 episodes of dizziness and lightheadedness this morning. This occurred while lying down. Dissipated after a few seconds. No N/V. No CP/SOB. No headaches. No visual changes. Pt notes cough with URI symptoms of unilateral sinus pressure on left as well as ear pressure to left. Attempted Flonase and robitussin with minimal relief. No fevers. No pain currently. No other symptoms noted.    History reviewed. No pertinent past medical history.  There are no active problems to display for this patient.   Past Surgical History:  Procedure Laterality Date  . DENTAL SURGERY    . TUBAL LIGATION  2 years ago    OB History    No data available       Home Medications    Prior to Admission medications   Medication Sig Start Date End Date Taking? Authorizing Provider  benzonatate (TESSALON) 100 MG capsule Take 1 capsule (100 mg total) by mouth every 8 (eight) hours. 05/25/17   Bethel BornGekas, Kelly Marie, PA-C  fluticasone (FLONASE) 50 MCG/ACT nasal spray Place 1 spray into both nostrils daily. 05/25/17   Bethel BornGekas, Kelly Marie, PA-C  ibuprofen (ADVIL,MOTRIN) 600 MG tablet Take 1 tablet (600 mg total) by mouth every 6 (six) hours as needed. 05/25/17   Bethel BornGekas, Kelly Marie, PA-C  methocarbamol (ROBAXIN) 500 MG tablet Take 1 tablet (500 mg total) by mouth 2 (two) times daily. 09/28/16   Barrett HenleNadeau, Nicole Elizabeth, PA-C  ondansetron (ZOFRAN) 4 MG tablet Take 1 tablet (4 mg total) by mouth every 6 (six) hours. 10/27/16   Long, Arlyss RepressJoshua G, MD    Family History No family history on file.  Social History Social History  Substance Use Topics  . Smoking status: Current Every Day Smoker    Packs/day: 0.50    Types: Cigarettes    . Smokeless tobacco: Never Used  . Alcohol use Yes     Comment: occasionally     Allergies   Patient has no known allergies.   Review of Systems Review of Systems ROS reviewed and all are negative for acute change except as noted in the HPI.  Physical Exam Updated Vital Signs BP 128/73 (BP Location: Right Arm)   Pulse 89   Temp 98.4 F (36.9 C) (Oral)   Resp 18   Ht 5' 5.75" (1.67 m)   Wt 113.5 kg (250 lb 4.8 oz)   LMP 05/03/2017 (Approximate)   SpO2 100%   BMI 40.71 kg/m   Physical Exam  Constitutional: She is oriented to person, place, and time. Vital signs are normal. She appears well-developed and well-nourished.  HENT:  Head: Normocephalic and atraumatic.  Right Ear: Hearing normal.  Left Ear: Hearing normal. No tenderness. Tympanic membrane is bulging.  Nose: Left sinus exhibits frontal sinus tenderness.  Eyes: Pupils are equal, round, and reactive to light. Conjunctivae and EOM are normal.  Neck: Normal range of motion. Neck supple.  Cardiovascular: Normal rate, regular rhythm, normal heart sounds and intact distal pulses.   Pulmonary/Chest: Effort normal and breath sounds normal.  Musculoskeletal: Normal range of motion.  Ambulated without difficulty. TTP along left trapezius musculature. No palpable or visible deformities.   Neurological: She is alert and  oriented to person, place, and time. She has normal strength. No cranial nerve deficit or sensory deficit.  Cranial Nerves:  II: Pupils equal, round, reactive to light III,IV, VI: ptosis not present, extra-ocular motions intact bilaterally  V,VII: smile symmetric, facial light touch sensation equal VIII: hearing grossly normal bilaterally  IX,X: midline uvula rise  XI: bilateral shoulder shrug equal and strong XII: midline tongue extension  Skin: Skin is warm and dry.  Psychiatric: She has a normal mood and affect. Her speech is normal and behavior is normal. Thought content normal.  Nursing note and  vitals reviewed.    ED Treatments / Results  Labs (all labs ordered are listed, but only abnormal results are displayed) Labs Reviewed  BASIC METABOLIC PANEL - Abnormal; Notable for the following:       Result Value   Glucose, Bld 133 (*)    Calcium 8.7 (*)    All other components within normal limits  CBC - Abnormal; Notable for the following:    WBC 15.3 (*)    RBC 5.12 (*)    All other components within normal limits  CBG MONITORING, ED - Abnormal; Notable for the following:    Glucose-Capillary 138 (*)    All other components within normal limits  URINALYSIS, ROUTINE W REFLEX MICROSCOPIC    EKG  EKG Interpretation None       Radiology No results found.  Procedures Procedures (including critical care time)  Medications Ordered in ED Medications - No data to display   Initial Impression / Assessment and Plan / ED Course  I have reviewed the triage vital signs and the nursing notes.  Pertinent labs & imaging results that were available during my care of the patient were reviewed by me and considered in my medical decision making (see chart for details).  Final Clinical Impressions(s) / ED Diagnoses  {I have reviewed and evaluated the relevant laboratory values.   {I have reviewed the relevant previous healthcare records.  {I obtained HPI from historian.   ED Course:  Assessment: Pt is a 31 y.o. female presents to the Emergency Department today due to x 2 episodes of dizziness and lightheadedness this morning. This occurred while lying down. Dissipated after a few seconds. No N/V. No CP/SOB. No headaches. No visual changes. Pt notes cough with URI symptoms of unilateral sinus pressure on left as well as ear pressure to left. Attempted Flonase and robitussin with minimal relief. No fevers. No pain currently. On exam, pt in NAD. Nontoxic/nonseptic appearing. VSS. Afebrile. Lungs CTA. Heart RRR. Abdomen nontender soft. Sinus TTP left maxillary. Mild TM bulging. Non  infectious. Pt also noted left trapezius strain from work. NO trauma to area. Will treat accordingly. Labs reassuring. Will treat for bacterial sinusitis likely causing peripheral vertigo. Plan is to DC home with ABX and follow up to PCP. At time of discharge, Patient is in no acute distress. Vital Signs are stable. Patient is able to ambulate. Patient able to tolerate PO.    Disposition/Plan:  DC Home Additional Verbal discharge instructions given and discussed with patient.  Pt Instructed to f/u with PCP in the next week for evaluation and treatment of symptoms. Return precautions given Pt acknowledges and agrees with plan  Supervising Physician Mesner, Barbara CowerJason, MD  Final diagnoses:  Acute non-recurrent maxillary sinusitis    New Prescriptions New Prescriptions   No medications on file       Wilber BihariMohr, Jaymere Alen, PA-C 05/27/17 2002    Mesner, Barbara CowerJason, MD 05/28/17 [phone]

## 2017-05-27 NOTE — ED Triage Notes (Signed)
Per Pt, Pt is coming from home when she had two episode of dizziness/lightheadedness that happened. Pt reports both episodes she was lying down. Complains that she was sensitive to light and she felt like she was going to pass out. Denies CP or SOB.

## 2017-12-04 ENCOUNTER — Other Ambulatory Visit: Payer: Self-pay

## 2017-12-04 ENCOUNTER — Encounter (HOSPITAL_COMMUNITY): Payer: Self-pay | Admitting: Emergency Medicine

## 2017-12-04 ENCOUNTER — Ambulatory Visit (HOSPITAL_COMMUNITY)
Admission: EM | Admit: 2017-12-04 | Discharge: 2017-12-04 | Disposition: A | Discharge status: Home / Self Care | Payer: 59 | Attending: Emergency Medicine | Admitting: Emergency Medicine

## 2017-12-04 DIAGNOSIS — J00 Acute nasopharyngitis [common cold]: Secondary | ICD-10-CM | POA: Diagnosis not present

## 2017-12-04 MED ORDER — CETIRIZINE-PSEUDOEPHEDRINE ER 5-120 MG PO TB12: 1.0000 | ORAL_TABLET | Freq: Every day | ORAL | 0 refills | Status: DC

## 2017-12-04 MED ORDER — CROMOLYN SODIUM 5.2 MG/ACT NA AERS: 1.0000 | INHALATION_SPRAY | Freq: Four times a day (QID) | NASAL | 12 refills | Status: DC

## 2017-12-04 NOTE — Discharge Instructions (Signed)
Push fluids to ensure adequate hydration and keep secretions thin.  Tylenol and/or ibuprofen as needed for pain or fevers.  Continue with over the counter treatments for symptoms.  Daily flonase, cromolyn up to 4 times a day for congestion and sore throat. Zyrtex d may be helpful with symptoms.  If symptoms worsen or do not improve in the next week to return to be seen or to follow up with your primary care provider

## 2017-12-04 NOTE — ED Triage Notes (Signed)
On Sunday started feeling bad.  Patient has burning in nase, eyes watery, sneezing spells, facial pain.  Reports many co-workers with similar symptoms.  No sore throat, but not normal to patient, patient has had sinus drainage.

## 2017-12-04 NOTE — ED Provider Notes (Signed)
[facility]    CSN: [id number] Arrival date & time: 12/04/17  1441     History   Chief Complaint Chief Complaint  Patient presents with  . URI    HPI Ariana Rogers is a 32 y.o. female.   Ariana Rogers presents with complaints of congestion with nasal pressure, mild cough, sneezing which started two days ago. Without gi/gu complaints, ear pain, fevers. Eating and drinking. Coworkers and children have been ill with similar. Has been using flonase and sudafed which have minimally helped. Without contributing medical history.   ROS per HPI.       History reviewed. No pertinent past medical history.  There are no active problems to display for this patient.   Past Surgical History:  Procedure Laterality Date  . DENTAL SURGERY    . TUBAL LIGATION  2 years ago    OB History    No data available       Home Medications    Prior to Admission medications   Medication Sig Start Date End Date Taking? Authorizing Provider  fluticasone (FLONASE) 50 MCG/ACT nasal spray Place 1 spray into both nostrils daily. 05/25/17  Yes Bethel Born, PA-C  pseudoephedrine (SUDAFED) 30 MG tablet Take 30 mg by mouth every 4 (four) hours as needed for congestion.   Yes [provider]  benzonatate (TESSALON) 100 MG capsule Take 1 capsule (100 mg total) by mouth every 8 (eight) hours. 05/25/17   Bethel Born, PA-C  cetirizine-pseudoephedrine (ZYRTEC-D) 5-120 MG tablet Take 1 tablet by mouth daily. 12/04/17   Georgetta Haber, NP  cromolyn (NASALCROM) 5.2 MG/ACT nasal spray Place 1 spray into both nostrils 4 (four) times daily. 12/04/17   Georgetta Haber, NP  ibuprofen (ADVIL,MOTRIN) 600 MG tablet Take 1 tablet (600 mg total) by mouth every 6 (six) hours as needed. 05/25/17   Bethel Born, PA-C  methocarbamol (ROBAXIN) 500 MG tablet Take 1 tablet (500 mg total) by mouth 2 (two) times daily. 05/27/17   Audry Pili, PA-C  ondansetron (ZOFRAN) 4 MG tablet Take 1 tablet (4 mg  total) by mouth every 6 (six) hours. 10/27/16   Long, Arlyss Repress, MD    Family History Family History  Problem Relation Age of Onset  . Hypertension Mother   . Arthritis Mother   . Fibromyalgia Mother     Social History Social History   Tobacco Use  . Smoking status: Current Every Day Smoker    Packs/day: 0.50    Types: Cigarettes  . Smokeless tobacco: Never Used  Substance Use Topics  . Alcohol use: Yes    Comment: occasionally  . Drug use: No     Allergies   Patient has no known allergies.   Review of Systems Review of Systems   Physical Exam Triage Vital Signs ED Triage Vitals  Enc Vitals Group     BP 12/04/17 1637 (!) 135/99     Pulse Rate 12/04/17 1637 80     Resp 12/04/17 1637 18     Temp 12/04/17 1637 98.2 F (36.8 C)     Temp Source 12/04/17 1637 Oral     SpO2 12/04/17 1637 99 %     Weight --      Height --      Head Circumference --      Peak Flow --      Pain Score 12/04/17 1633 8     Pain Loc --      Pain Edu? --  Excl. in GC? --    No data found.  Updated Vital Signs BP (!) 135/99 (BP Location: Left Arm)   Pulse 80   Temp 98.2 F (36.8 C) (Oral)   Resp 18   LMP 11/21/2017   SpO2 99%   Visual Acuity Right Eye Distance:   Left Eye Distance:   Bilateral Distance:    Right Eye Near:   Left Eye Near:    Bilateral Near:     Physical Exam  Constitutional: She is oriented to person, place, and time. She appears well-developed and well-nourished. No distress.  HENT:  Head: Normocephalic and atraumatic.  Right Ear: Tympanic membrane, external ear and ear canal normal.  Left Ear: Tympanic membrane, external ear and ear canal normal.  Nose: Rhinorrhea present. Right sinus exhibits frontal sinus tenderness. Left sinus exhibits maxillary sinus tenderness and frontal sinus tenderness.  Mouth/Throat: Uvula is midline, oropharynx is clear and moist and mucous membranes are normal. No tonsillar exudate.  Eyes: Conjunctivae and EOM are  normal. Pupils are equal, round, and reactive to light.  Cardiovascular: Normal rate, regular rhythm and normal heart sounds.  Pulmonary/Chest: Effort normal and breath sounds normal.  Neurological: She is alert and oriented to person, place, and time.  Skin: Skin is warm and dry.     UC Treatments / Results  Labs (all labs ordered are listed, but only abnormal results are displayed) Labs Reviewed - No data to display  EKG  EKG Interpretation None       Radiology No results found.  Procedures Procedures (including critical care time)  Medications Ordered in UC Medications - No data to display   Initial Impression / Assessment and Plan / UC Course  I have reviewed the triage vital signs and the nursing notes.  Pertinent labs & imaging results that were available during my care of the patient were reviewed by me and considered in my medical decision making (see chart for details).     Benign physical findings. History and physical consistent with viral illness.  Supportive cares recommended. If symptoms worsen or do not improve in the next week to return to be seen or to follow up with PCP.  Patient verbalized understanding and agreeable to plan.    Final Clinical Impressions(s) / UC Diagnoses   Final diagnoses:  Acute nasopharyngitis    ED Discharge Orders        Ordered    cetirizine-pseudoephedrine (ZYRTEC-D) 5-120 MG tablet  Daily     12/04/17 1702    cromolyn (NASALCROM) 5.2 MG/ACT nasal spray  4 times daily     12/04/17 1702       Controlled Substance Prescriptions [place] Controlled Substance Registry consulted? Not Applicable   Georgetta HaberBurky, Natalie B, NP 12/04/17 1725

## 2017-12-23 ENCOUNTER — Encounter (HOSPITAL_COMMUNITY): Payer: Self-pay | Admitting: Emergency Medicine

## 2017-12-23 ENCOUNTER — Ambulatory Visit (HOSPITAL_COMMUNITY)
Admission: EM | Admit: 2017-12-23 | Discharge: 2017-12-23 | Disposition: A | Discharge status: Home / Self Care | Payer: 59 | Attending: Family Medicine | Admitting: Family Medicine

## 2017-12-23 ENCOUNTER — Other Ambulatory Visit: Payer: Self-pay

## 2017-12-23 DIAGNOSIS — H01146 Xeroderma of left eye, unspecified eyelid: Secondary | ICD-10-CM | POA: Diagnosis not present

## 2017-12-23 MED ORDER — OLOPATADINE HCL 0.1 % OP SOLN: 1.0000 [drp] | Freq: Two times a day (BID) | OPHTHALMIC | 0 refills | Status: AC

## 2017-12-23 NOTE — ED Provider Notes (Addendum)
[facility]    CSN: [id number] Arrival date & time: 12/23/17  1800     History   Chief Complaint Chief Complaint  Patient presents with  . Eye Pain    HPI Ariana Rogers is a 32 y.o. female no acute rating past medical history presenting today with left eye irritation and dryness to the upper lid.  She states that she has developed a dry rash to her left upper lid that began yesterday, started to swell and cause a little bit of pain with blinking about 1 hour ago.  Denies pain in the eye.  Denies any changes in vision.  Denies history of eczema or previous similar problems.  States she has had styes in the past that she typically uses stye cream.  Denies eye drainage.  HPI  History reviewed. No pertinent past medical history.  There are no active problems to display for this patient.   Past Surgical History:  Procedure Laterality Date  . DENTAL SURGERY    . TUBAL LIGATION  2 years ago    OB History    No data available       Home Medications    Prior to Admission medications   Medication Sig Start Date End Date Taking? Authorizing Provider  ibuprofen (ADVIL,MOTRIN) 600 MG tablet Take 1 tablet (600 mg total) by mouth every 6 (six) hours as needed. 05/25/17   Bethel Born, PA-C  olopatadine (PATANOL) 0.1 % ophthalmic solution Place 1 drop into the left eye 2 (two) times daily for 7 days. 12/23/17 12/30/17  Wieters, Hallie C, PA-C  pseudoephedrine (SUDAFED) 30 MG tablet Take 30 mg by mouth every 4 (four) hours as needed for congestion.    [provider]    Family History Family History  Problem Relation Age of Onset  . Hypertension Mother   . Arthritis Mother   . Fibromyalgia Mother     Social History Social History   Tobacco Use  . Smoking status: Current Every Day Smoker    Packs/day: 0.50    Types: Cigarettes  . Smokeless tobacco: Never Used  Substance Use Topics  . Alcohol use: Yes    Comment: occasionally  . Drug use: No      Allergies   Patient has no known allergies.   Review of Systems Review of Systems  Constitutional: Negative for fever.  HENT: Negative for congestion and ear pain.   Eyes: Positive for itching. Negative for photophobia, pain, discharge, redness and visual disturbance.  Respiratory: Negative for shortness of breath.   Cardiovascular: Negative for chest pain.  Gastrointestinal: Negative for nausea and vomiting.  Skin: Positive for rash.  Neurological: Negative for dizziness, light-headedness and headaches.     Physical Exam Triage Vital Signs ED Triage Vitals [12/23/17 1841]  Enc Vitals Group     BP (!) 141/89     Pulse Rate 78     Resp 18     Temp 98.7 F (37.1 C)     Temp Source Oral     SpO2 98 %     Weight      Height      Head Circumference      Peak Flow      Pain Score 0     Pain Loc      Pain Edu?      Excl. in GC?    No data found.  Updated Vital Signs BP (!) 141/89 (BP Location: Left Arm) Comment: Notified [place]  Pulse 78   Temp 98.7 F (37.1 C) (Oral)   Resp 18   LMP 12/23/2017   SpO2 98%   Visual Acuity Right Eye Distance:  20/20 Left Eye Distance:  20/30 Bilateral Distance:  20/20  Right Eye Near:   Left Eye Near:    Bilateral Near:     Physical Exam  Constitutional: She appears well-developed and well-nourished. No distress.  HENT:  Head: Normocephalic and atraumatic.  Eyes: Conjunctivae are normal.  Neck: Neck supple.  Cardiovascular: Normal rate.  No murmur heard. Pulmonary/Chest: Effort normal. No respiratory distress.  Musculoskeletal: She exhibits no edema.  Neurological: She is alert.  Skin: Skin is warm and dry.  Mild swelling and dryness with slight hyperpigmentation to medial aspect of upper lid on left eye.  No overlying erythema.  No conjunctival erythema or discharge seen.  PE RRL, EOMI.  Normal anterior chamber depth.  Psychiatric: She has a normal mood and affect.  Nursing note and vitals reviewed.      UC  Treatments / Results  Labs (all labs ordered are listed, but only abnormal results are displayed) Labs Reviewed - No data to display  EKG  EKG Interpretation None       Radiology No results found.  Procedures Procedures (including critical care time)  Medications Ordered in UC Medications - No data to display   Initial Impression / Assessment and Plan / UC Course  I have reviewed the triage vital signs and the nursing notes.  Pertinent labs & imaging results that were available during my care of the patient were reviewed by me and considered in my medical decision making (see chart for details).     Patient with dryness since mild swelling to lid, will treat with warm compresses, lid scrubs/baby shampoo to lid as well as Vaseline to help with dryness.  Provided Pataday eyedrops to help with any irritation.  Possible eczema versus early stye versus early blepharitis.  Advised to continue to monitor, at this time does not appear infected.  Return for any increased swelling, pain, changes in vision. Discussed strict return precautions. Patient verbalized understanding and is agreeable with plan.   Final Clinical Impressions(s) / UC Diagnoses   Final diagnoses:  Dryness of eyelid, left    ED Discharge Orders        Ordered    olopatadine (PATANOL) 0.1 % ophthalmic solution  2 times daily     12/23/17 1905       Controlled Substance Prescriptions [place] Controlled Substance Registry consulted? Not Applicable   Lew DawesWieters, Hallie C, PA-C 12/23/17 1916    Lew DawesWieters, Hallie C, [place]-C 12/23/17 1916

## 2017-12-23 NOTE — ED Triage Notes (Signed)
Pt c/o L eye irrtation, states its painful to blink and it feels swollen.

## 2017-12-23 NOTE — Discharge Instructions (Signed)
Please apply warm compresses, wash lids with baby shampoo and water. May apply vaseline to help with dryness.  This does not appear infected at this time. If you have increased swelling, redness, or changes in vision, eye drainage please return.

## 2017-12-24 ENCOUNTER — Ambulatory Visit (HOSPITAL_COMMUNITY)
Admission: EM | Admit: 2017-12-24 | Discharge: 2017-12-24 | Disposition: A | Discharge status: Home / Self Care | Payer: 59 | Attending: Family Medicine | Admitting: Family Medicine

## 2017-12-24 ENCOUNTER — Encounter (HOSPITAL_COMMUNITY): Payer: Self-pay | Admitting: Emergency Medicine

## 2017-12-24 DIAGNOSIS — H01004 Unspecified blepharitis left upper eyelid: Secondary | ICD-10-CM

## 2017-12-24 MED ORDER — ERYTHROMYCIN 5 MG/GM OP OINT: TOPICAL_OINTMENT | OPHTHALMIC | 0 refills | Status: DC

## 2017-12-24 NOTE — Discharge Instructions (Signed)
Use erythromycin ointment on affected area. Artificial tear gel at night, you can get systane/genteal gel over the counter. Wait 10-15 minutes between drops, always use artificial tear gel last, as it prevents drops from penetrating through. Lid scrubs and warm compresses as directed. Monitor for any worsening of symptoms, changes in vision, sensitivity to light, eye swelling, follow up with ophthalmology for further evaluation.  If experiencing increased eye swelling, with trouble moving eye/painful eye movement, go to the emergency department for further evaluation

## 2017-12-24 NOTE — ED Triage Notes (Signed)
PT reports she was seen last night for eyelid pain and swelling. PT was given eye drops. PT reports swelling is worse today.

## 2017-12-24 NOTE — ED Provider Notes (Signed)
[facility]    CSN: [id number] Arrival date & time: 12/24/17  1230     History   Chief Complaint Chief Complaint  Patient presents with  . Eye Problem    HPI Ariana Rogers is a 32 y.o. female.   32 year old female comes in for worsening left eyelid pain and swelling after being seen yesterday.  At that time she has had symptoms for 1 day, with hyperpigmentation of the left upper eyelid, and was given Pataday.  States she has had worsening swelling and eyelid pain starting last night. Used pataday eye drop starting this morning. States had burning sensation when applied. No obvious changes in vision.  Denies photophobia.  Left eye crusting when she woke up this morning.  Slight foreign body sensation, better with blinking.  States had eye redness this morning.  Used make up over the weekend, otherwise has not applied makeup or other facial products.  Denies new exposure to the area.  Denies contact lens use, glasses use.      History reviewed. No pertinent past medical history.  There are no active problems to display for this patient.   Past Surgical History:  Procedure Laterality Date  . DENTAL SURGERY    . TUBAL LIGATION  2 years ago    OB History    No data available       Home Medications    Prior to Admission medications   Medication Sig Start Date End Date Taking? Authorizing Provider  olopatadine (PATANOL) 0.1 % ophthalmic solution Place 1 drop into the left eye 2 (two) times daily for 7 days. 12/23/17 12/30/17 Yes Wieters, Hallie C, PA-C  erythromycin ophthalmic ointment Place a 1/2 inch ribbon of ointment 4 times a day on affected area. 12/24/17   Cathie HoopsYu, Robbin Escher V, PA-C  ibuprofen (ADVIL,MOTRIN) 600 MG tablet Take 1 tablet (600 mg total) by mouth every 6 (six) hours as needed. 05/25/17   Bethel BornGekas, Kelly Marie, PA-C    Family History Family History  Problem Relation Age of Onset  . Hypertension Mother   . Arthritis Mother   . Fibromyalgia Mother     Social  History Social History   Tobacco Use  . Smoking status: Current Every Day Smoker    Packs/day: 0.50    Types: Cigarettes  . Smokeless tobacco: Never Used  Substance Use Topics  . Alcohol use: Yes    Comment: occasionally  . Drug use: No     Allergies   Patient has no known allergies.   Review of Systems Review of Systems  Reason unable to perform ROS: See HPI as above.     Physical Exam Triage Vital Signs ED Triage Vitals [12/24/17 1430]  Enc Vitals Group     BP (!) 139/102     Pulse Rate 73     Resp 18     Temp 98.8 F (37.1 C)     Temp Source Oral     SpO2 100 %     Weight 250 lb (113.4 kg)     Height      Head Circumference      Peak Flow      Pain Score 5     Pain Loc      Pain Edu?      Excl. in GC?    No data found.  Updated Vital Signs BP (!) 139/102 (BP Location: Right Arm) Comment: Notified [place]  Pulse 73   Temp 98.8 F (37.1 C) (Oral)  Resp 18   Wt 250 lb (113.4 kg)   LMP 12/19/2017   SpO2 100%   BMI 40.66 kg/m   Visual Acuity Right Eye Distance: 20/20 Left Eye Distance: 20/50 Bilateral Distance:    Right Eye Near:   Left Eye Near:    Bilateral Near:     Physical Exam  Constitutional: She is oriented to person, place, and time. She appears well-developed and well-nourished. No distress.  HENT:  Head: Normocephalic and atraumatic.  Eyes: Conjunctivae and EOM are normal. Pupils are equal, round, and reactive to light. Lids are everted and swept, no foreign bodies found.  No conjunctival injection.  See picture below.  Patient with slight swelling of the eyelid, with hyperpigmentation of the nasal aspect of the left upper eyelid.  No palpable hordeolum.  No increased warmth, erythema.   Neurological: She is alert and oriented to person, place, and time.       UC Treatments / Results  Labs (all labs ordered are listed, but only abnormal results are displayed) Labs Reviewed - No data to display  EKG  EKG  Interpretation None       Radiology No results found.  Procedures Procedures (including critical care time)  Medications Ordered in UC Medications - No data to display   Initial Impression / Assessment and Plan / UC Course  I have reviewed the triage vital signs and the nursing notes.  Pertinent labs & imaging results that were available during my care of the patient were reviewed by me and considered in my medical decision making (see chart for details).    History and exam most consistent with blepharitis/dry eyes.  Erythromycin on affected area.  Lid scrubs,  warm compress.  Patient to use artificial tears for dry eyes.  Patient with slight decrease in vision of the left eye on visual acuity, however not noticeable for patient.  Given improvement with blinking, will have patient monitor for now with artificial tear.  Return precautions given.  Patient expresses understanding and agrees to plan.  Final Clinical Impressions(s) / UC Diagnoses   Final diagnoses:  Blepharitis of left upper eyelid, unspecified type    ED Discharge Orders        Ordered    erythromycin ophthalmic ointment     12/24/17 1500       Belinda Fisher, [place] 12/24/17 1507

## 2018-01-15 ENCOUNTER — Ambulatory Visit (INDEPENDENT_AMBULATORY_CARE_PROVIDER_SITE_OTHER): Payer: 59 | Admitting: Family Medicine

## 2018-01-15 ENCOUNTER — Encounter: Payer: Self-pay | Admitting: Family Medicine

## 2018-01-15 VITALS — BP 136/88 | HR 84 | Temp 98.0°F | Ht 65.75 in | Wt 264.0 lb

## 2018-01-15 DIAGNOSIS — Z1329 Encounter for screening for other suspected endocrine disorder: Secondary | ICD-10-CM | POA: Diagnosis not present

## 2018-01-15 DIAGNOSIS — Z1389 Encounter for screening for other disorder: Secondary | ICD-10-CM

## 2018-01-15 DIAGNOSIS — D72829 Elevated white blood cell count, unspecified: Secondary | ICD-10-CM

## 2018-01-15 DIAGNOSIS — R7303 Prediabetes: Secondary | ICD-10-CM | POA: Diagnosis not present

## 2018-01-15 DIAGNOSIS — Z23 Encounter for immunization: Secondary | ICD-10-CM

## 2018-01-15 LAB — POCT GLYCOSYLATED HEMOGLOBIN (HGB A1C): Hemoglobin A1C: 5.9

## 2018-01-15 LAB — POCT URINALYSIS DIP (DEVICE)
Bilirubin Urine: NEGATIVE
Glucose, UA: NEGATIVE mg/dL
Ketones, ur: NEGATIVE mg/dL
Nitrite: NEGATIVE
Protein, ur: NEGATIVE mg/dL
UROBILINOGEN UA: 0.2 mg/dL (ref 0.0–1.0)
pH: 6 (ref 5.0–8.0)

## 2018-01-15 NOTE — Patient Instructions (Signed)
Preventing Hypertension Hypertension, commonly called high blood pressure, is when the force of blood pumping through the arteries is too strong. Arteries are blood vessels that carry blood from the heart throughout the body. Over time, hypertension can damage the arteries and decrease blood flow to important parts of the body, including the brain, heart, and kidneys. Often, hypertension does not cause symptoms until blood pressure is very high. For this reason, it is important to have your blood pressure checked on a regular basis. Hypertension can often be prevented with diet and lifestyle changes. If you already have hypertension, you can control it with diet and lifestyle changes, as well as medicine. What nutrition changes can be made? Maintain a healthy diet. This includes:  Eating less salt (sodium). Ask your health care provider how much sodium is safe for you to have. The general recommendation is to consume less than 1 tsp (2,300 mg) of sodium a day. ? Do not add salt to your food. ? Choose low-sodium options when grocery shopping and eating out.  Limiting fats in your diet. You can do this by eating low-fat or fat-free dairy products and by eating less red meat.  Eating more fruits, vegetables, and whole grains. Make a goal to eat: ? 1-2 cups of fresh fruits and vegetables each day. ? 3-4 servings of whole grains each day.  Avoiding foods and beverages that have added sugars.  Eating fish that contain healthy fats (omega-3 fatty acids), such as mackerel or salmon.  If you need help putting together a healthy eating plan, try the DASH diet. This diet is high in fruits, vegetables, and whole grains. It is low in sodium, red meat, and added sugars. DASH stands for Dietary Approaches to Stop Hypertension. What lifestyle changes can be made?  Lose weight if you are overweight. Losing just 3?5% of your body weight can help prevent or control hypertension. ? For example, if your  present weight is 200 lb (91 kg), a loss of 3-5% of your weight means losing 6-10 lb (2.7-4.5 kg). ? Ask your health care provider to help you with a diet and exercise plan to safely lose weight.  Get enough exercise. Do at least 150 minutes of moderate-intensity exercise each week. ? You could do this in short exercise sessions several times a day, or you could do longer exercise sessions a few times a week. For example, you could take a brisk 10-minute walk or bike ride, 3 times a day, for 5 days a week.  Find ways to reduce stress, such as exercising, meditating, listening to music, or taking a yoga class. If you need help reducing stress, ask your health care provider.  Do not smoke. This includes e-cigarettes. Chemicals in tobacco and nicotine products raise your blood pressure each time you smoke. If you need help quitting, ask your health care provider.  Avoid alcohol. If you drink alcohol, limit alcohol intake to no more than 1 drink a day for nonpregnant women and 2 drinks a day for men. One drink equals 12 oz of beer, 5 oz of wine, or 1 oz of hard liquor. Why are these changes important? Diet and lifestyle changes can help you prevent hypertension, and they may make you feel better overall and improve your quality of life. If you have hypertension, making these changes will help you control it and help prevent major complications, such as:  Hardening and narrowing of arteries that supply blood to: ? Your heart. This can cause a  heart attack. ? Your brain. This can cause a stroke. ? Your kidneys. This can cause kidney failure.  Stress on your heart muscle, which can cause heart failure.  What can I do to lower my risk?  Work with your health care provider to make a hypertension prevention plan that works for you. Follow your plan and keep all follow-up visits as told by your health care provider.  Learn how to check your blood pressure at home. Make sure that you know your personal  target blood pressure, as told by your health care provider. How is this treated? In addition to diet and lifestyle changes, your health care provider may recommend medicines to help lower your blood pressure. You may need to try a few different medicines to find what works best for you. You also may need to take more than one medicine. Take over-the-counter and prescription medicines only as told by your health care provider. Where to find support: Your health care provider can help you prevent hypertension and help you keep your blood pressure at a healthy level. Your local hospital or your community may also provide support services and prevention programs. The American Heart Association offers an online support network at: https://www.lee.net/http://supportnetwork.heart.org/high-blood-pressure Where to find more information: Learn more about hypertension from:  National Heart, Lung, and Blood Institute: https://www.peterson.org/www.nhlbi.nih.gov/health/health-topics/topics/hbp  Centers for Disease Control and Prevention: AboutHD.co.nzwww.cdc.gov/bloodpressure  American Academy of Family Physicians: http://familydoctor.org/familydoctor/en/diseases-conditions/high-blood-pressure.printerview.all.html  Learn more about the DASH diet from:  National Heart, Lung, and Blood Institute: WedMap.itwww.nhlbi.nih.gov/health/health-topics/topics/dash  Contact a health care provider if:  You think you are having a reaction to medicines you have taken.  You have recurrent headaches or feel dizzy.  You have swelling in your ankles.  You have trouble with your vision. Summary  Hypertension often does not cause any symptoms until blood pressure is very high. It is important to get your blood pressure checked regularly.  Diet and lifestyle changes are the most important steps in preventing hypertension.  By keeping your blood pressure in a healthy range, you can prevent complications like heart attack, heart failure, stroke, and kidney failure.  Work with your  health care provider to make a hypertension prevention plan that works for you. This information is not intended to replace advice given to you by your health care provider. Make sure you discuss any questions you have with your health care provider. Document Released: 10/31/2015 Document Revised: 06/26/2016 Document Reviewed: 06/26/2016 Elsevier Interactive Patient Education  2018 [employer]  Calorie Counting for [employer] Calories are units of energy. Your body needs a certain amount of calories from food to keep you going throughout the day. When you eat more calories than your body needs, your body stores the extra calories as fat. When you eat fewer calories than your body needs, your body burns fat to get the energy it needs. Calorie counting means keeping track of how many calories you eat and drink each day. Calorie counting can be helpful if you need to lose weight. If you make sure to eat fewer calories than your body needs, you should lose weight. Ask your health care provider what a healthy weight is for you. For calorie counting to work, you will need to eat the right number of calories in a day in order to lose a healthy amount of weight per week. A dietitian can help you determine how many calories you need in a day and will give you suggestions on how to reach your calorie goal.  A healthy amount of weight to lose per week is usually 1-2 lb (0.5-0.9 kg). This usually means that your daily calorie intake should be reduced by 500-750 calories.  Eating 1,200 - 1,500 calories per day can help most women lose weight.  Eating 1,500 - 1,800 calories per day can help most men lose weight.  What is my plan? My goal is to have __________ calories per day. If I have this many calories per day, I should lose around __________ pounds per week. What do I need to know about calorie counting? In order to meet your daily calorie goal, you will need to:  Find out how many calories are in  each food you would like to eat. Try to do this before you eat.  Decide how much of the food you plan to eat.  Write down what you ate and how many calories it had. Doing this is called keeping a food log.  To successfully lose weight, it is important to balance calorie counting with a healthy lifestyle that includes regular activity. Aim for 150 minutes of moderate exercise (such as walking) or 75 minutes of vigorous exercise (such as running) each week. Where do I find calorie information?  The number of calories in a food can be found on a Nutrition Facts label. If a food does not have a Nutrition Facts label, try to look up the calories online or ask your dietitian for help. Remember that calories are listed per serving. If you choose to have more than one serving of a food, you will have to multiply the calories per serving by the amount of servings you plan to eat. For example, the label on a package of bread might say that a serving size is 1 slice and that there are 90 calories in a serving. If you eat 1 slice, you will have eaten 90 calories. If you eat 2 slices, you will have eaten 180 calories. How do I keep a food log? Immediately after each meal, record the following information in your food log:  What you ate. Don't forget to include toppings, sauces, and other extras on the food.  How much you ate. This can be measured in cups, ounces, or number of items.  How many calories each food and drink had.  The total number of calories in the meal.  Keep your food log near you, such as in a small notebook in your pocket, or use a mobile app or website. Some programs will calculate calories for you and show you how many calories you have left for the day to meet your goal. What are some calorie counting tips?  Use your calories on foods and drinks that will fill you up and not leave you hungry: ? Some examples of foods that fill you up are nuts and nut butters, vegetables, lean  proteins, and high-fiber foods like whole grains. High-fiber foods are foods with more than 5 g fiber per serving. ? Drinks such as sodas, specialty coffee drinks, alcohol, and juices have a lot of calories, yet do not fill you up.  Eat nutritious foods and avoid empty calories. Empty calories are calories you get from foods or beverages that do not have many vitamins or protein, such as candy, sweets, and soda. It is better to have a nutritious high-calorie food (such as an avocado) than a food with few nutrients (such as a bag of chips).  Know how many calories are in the foods you eat most  often. This will help you calculate calorie counts faster.  Pay attention to calories in drinks. Low-calorie drinks include water and unsweetened drinks.  Pay attention to nutrition labels for "low fat" or "fat free" foods. These foods sometimes have the same amount of calories or more calories than the full fat versions. They also often have added sugar, starch, or salt, to make up for flavor that was removed with the fat.  Find a way of tracking calories that works for you. Get creative. Try different apps or programs if writing down calories does not work for you. What are some portion control tips?  Know how many calories are in a serving. This will help you know how many servings of a certain food you can have.  Use a measuring cup to measure serving sizes. You could also try weighing out portions on a kitchen scale. With time, you will be able to estimate serving sizes for some foods.  Take some time to put servings of different foods on your favorite plates, bowls, and cups so you know what a serving looks like.  Try not to eat straight from a bag or box. Doing this can lead to overeating. Put the amount you would like to eat in a cup or on a plate to make sure you are eating the right portion.  Use smaller plates, glasses, and bowls to prevent overeating.  Try not to multitask (for example, watch  TV or use your computer) while eating. If it is time to eat, sit down at a table and enjoy your food. This will help you to know when you are full. It will also help you to be aware of what you are eating and how much you are eating. What are tips for following this plan? Reading food labels  Check the calorie count compared to the serving size. The serving size may be smaller than what you are used to eating.  Check the source of the calories. Make sure the food you are eating is high in vitamins and protein and low in saturated and trans fats. Shopping  Read nutrition labels while you shop. This will help you make healthy decisions before you decide to purchase your food.  Make a grocery list and stick to it. Cooking  Try to cook your favorite foods in a healthier way. For example, try baking instead of frying.  Use low-fat dairy products. Meal planning  Use more fruits and vegetables. Half of your plate should be fruits and vegetables.  Include lean proteins like poultry and fish. How do I count calories when eating out?  Ask for smaller portion sizes.  Consider sharing an entree and sides instead of getting your own entree.  If you get your own entree, eat only half. Ask for a box at the beginning of your meal and put the rest of your entree in it so you are not tempted to eat it.  If calories are listed on the menu, choose the lower calorie options.  Choose dishes that include vegetables, fruits, whole grains, low-fat dairy products, and lean protein.  Choose items that are boiled, broiled, grilled, or steamed. Stay away from items that are buttered, battered, fried, or served with cream sauce. Items labeled "crispy" are usually fried, unless stated otherwise.  Choose water, low-fat milk, unsweetened iced tea, or other drinks without added sugar. If you want an alcoholic beverage, choose a lower calorie option such as a glass of wine or light beer.  Ask  for dressings,  sauces, and syrups on the side. These are usually high in calories, so you should limit the amount you eat.  If you want a salad, choose a garden salad and ask for grilled meats. Avoid extra toppings like bacon, cheese, or fried items. Ask for the dressing on the side, or ask for olive oil and vinegar or lemon to use as dressing.  Estimate how many servings of a food you are given. For example, a serving of cooked rice is  cup or about the size of half a baseball. Knowing serving sizes will help you be aware of how much food you are eating at restaurants. The list below tells you how big or small some common portion sizes are based on everyday objects: ? 1 oz-4 stacked dice. ? 3 oz-1 deck of cards. ? 1 tsp-1 die. ? 1 Tbsp- a ping-pong ball. ? 2 Tbsp-1 ping-pong ball. ?  cup- baseball. ? 1 cup-1 baseball. Summary  Calorie counting means keeping track of how many calories you eat and drink each day. If you eat fewer calories than your body needs, you should lose weight.  A healthy amount of weight to lose per week is usually 1-2 lb (0.5-0.9 kg). This usually means reducing your daily calorie intake by 500-750 calories.  The number of calories in a food can be found on a Nutrition Facts label. If a food does not have a Nutrition Facts label, try to look up the calories online or ask your dietitian for help.  Use your calories on foods and drinks that will fill you up, and not on foods and drinks that will leave you hungry.  Use smaller plates, glasses, and bowls to prevent overeating. This information is not intended to replace advice given to you by your health care provider. Make sure you discuss any questions you have with your health care provider. Document Released: 10/16/2005 Document Revised: 09/15/2016 Document Reviewed: 09/15/2016 Elsevier Interactive Patient Education  2018 [employer].  Prediabetes Eating Plan Prediabetes-also called impaired glucose tolerance or impaired  fasting glucose-is a condition that causes blood sugar (blood glucose) levels to be higher than normal. Following a healthy diet can help to keep prediabetes under control. It can also help to lower the risk of type 2 diabetes and heart disease, which are increased in people who have prediabetes. Along with regular exercise, a healthy diet:  Promotes weight loss.  Helps to control blood sugar levels.  Helps to improve the way that the body uses insulin.  What do I need to know about this eating plan?  Use the glycemic index (GI) to plan your meals. The index tells you how quickly a food will raise your blood sugar. Choose low-GI foods. These foods take a longer time to raise blood sugar.  Pay close attention to the amount of carbohydrates in the food that you eat. Carbohydrates increase blood sugar levels.  Keep track of how many calories you take in. Eating the right amount of calories will help you to achieve a healthy weight. Losing about 7 percent of your starting weight can help to prevent type 2 diabetes.  You may want to follow a Mediterranean diet. This diet includes a lot of vegetables, lean meats or fish, whole grains, fruits, and healthy oils and fats. What foods can I eat? Grains Whole grains, such as whole-wheat or whole-grain breads, crackers, cereals, and pasta. Unsweetened oatmeal. Bulgur. Barley. Quinoa. Brown rice. Corn or whole-wheat flour tortillas or taco shells. Vegetables  Lettuce. Spinach. Peas. Beets. Cauliflower. Cabbage. Broccoli. Carrots. Tomatoes. Squash. Eggplant. Herbs. Peppers. Onions. Cucumbers. Brussels sprouts. Fruits Berries. Bananas. Apples. Oranges. Grapes. Papaya. Mango. Pomegranate. Kiwi. Grapefruit. Cherries. Meats and Other Protein Sources Seafood. Lean meats, such as chicken and [place] or lean cuts of pork and beef. Tofu. Eggs. Nuts. Beans. Dairy Low-fat or fat-free dairy products, such as yogurt, cottage cheese, and cheese. Beverages Water. Tea.  Coffee. Sugar-free or diet soda. Seltzer water. Milk. Milk alternatives, such as soy or almond milk. Condiments Mustard. Relish. Low-fat, low-sugar ketchup. Low-fat, low-sugar barbecue sauce. Low-fat or fat-free mayonnaise. Sweets and Desserts Sugar-free or low-fat pudding. Sugar-free or low-fat ice cream and other frozen treats. Fats and Oils Avocado. Walnuts. Olive oil. The items listed above may not be a complete list of recommended foods or beverages. Contact your dietitian for more options. What foods are not recommended? Grains Refined white flour and flour products, such as bread, pasta, snack foods, and cereals. Beverages Sweetened drinks, such as sweet iced tea and soda. Sweets and Desserts Baked goods, such as cake, cupcakes, pastries, cookies, and cheesecake. The items listed above may not be a complete list of foods and beverages to avoid. Contact your dietitian for more information. This information is not intended to replace advice given to you by your health care provider. Make sure you discuss any questions you have with your health care provider. Document Released: 03/02/2015 Document Revised: 03/23/2016 Document Reviewed: 11/11/2014 Elsevier Interactive Patient Education  2017 [employer].

## 2018-01-15 NOTE — Progress Notes (Signed)
Patient ID: Ariana Rogers, female    DOB: May 08, 1986, 32 y.o.   MRN: [id number]  PCP: Bing Neighbors, FNP  Chief Complaint  Patient presents with  . Establish Care  . Labs wbc has been elevated  . Weight Loss    Subjective:  HPI Ariana Rogers is a 32 y.o. female with severe obesity, current daily smoker, presents to establish care. She works full-time at a [employer]. Several years since she had primary care provider. Family history is significant for both parents with hypertension and paternal grandmother with heart disease w/hx of stent. Denies a family history significant for cancer. Reports no significant medical history. She is concern today regarding weight and elevated white blood cells which has been present during prior lab studies.  She is inactive of exercise and admits to a diet high sugar and carb rich foods. Current body mass index is 42.94 kg/m.  She has not made any real attempts at weight loss. Current daily smoker and is not ready to quit. Last PAP she is uncertain although thinks she had one around 1-2 years prior. No history of an abnormal PAP.  Denies history of depression, anxiety, abdominal problems, respiratory problems, chest pain, edema, palpations, genitourinary problems, dizziness, or weakness. Social History   Socioeconomic History  . Marital status: Single    Spouse name: Not on file  . Number of children: Not on file  . Years of education: Not on file  . Highest education level: Not on file  Social Needs  . Financial resource strain: Not on file  . Food insecurity - worry: Not on file  . Food insecurity - inability: Not on file  . Transportation needs - medical: Not on file  . Transportation needs - non-medical: Not on file  Occupational History  . Not on file  Tobacco Use  . Smoking status: Current Every Day Smoker    Packs/day: 0.50    Types: Cigarettes  . Smokeless tobacco: Never Used  Substance and Sexual Activity  . Alcohol  use: Yes    Comment: occasionally  . Drug use: No  . Sexual activity: Not on file  Other Topics Concern  . Not on file  Social History Narrative  . Not on file    Family History  Problem Relation Age of Onset  . Hypertension Mother   . Arthritis Mother   . Fibromyalgia Mother   . Other Father        no health concern   Review of Systems  Constitutional: Negative.   Eyes: Negative.   Respiratory: Negative.   Cardiovascular: Negative.   Gastrointestinal: Negative.   Genitourinary: Negative.   Skin: Negative.   Neurological: Positive for headaches.       Occasion headache resolve without intervention   Endo/Heme/Allergies: Negative.   Psychiatric/Behavioral: Negative.    No Known Allergies  Prior to Admission medications   Medication Sig Start Date End Date Taking? Authorizing Provider  erythromycin ophthalmic ointment Place a 1/2 inch ribbon of ointment 4 times a day on affected area. Patient not taking: Reported on 01/15/2018 12/24/17   Belinda Fisher, PA-C  ibuprofen (ADVIL,MOTRIN) 600 MG tablet Take 1 tablet (600 mg total) by mouth every 6 (six) hours as needed. Patient not taking: Reported on 01/15/2018 05/25/17   Bethel Born, PA-C    Past Medical, Surgical Family and Social History reviewed and updated.    Objective:   Today's Vitals   01/15/18 1306  BP: 136/88  Pulse:  84  Temp: 98 F (36.7 C)  TempSrc: Oral  SpO2: 99%  Weight: 264 lb (119.7 kg)  Height: 5' 5.75" (1.67 m)    Wt Readings from Last 3 Encounters:  01/15/18 264 lb (119.7 kg)  12/24/17 250 lb (113.4 kg)  05/27/17 250 lb 4.8 oz (113.5 kg)    Physical Exam Constitutional: Patient appears well-developed and well-nourished. No distress. HENT: Normocephalic, atraumatic, External right and left ear normal. Oropharynx is clear and moist.  Eyes: Conjunctivae and EOM are normal. PERRLA, no scleral icterus. Neck: Normal ROM. Neck supple. No JVD. No tracheal deviation. No thyromegaly. CVS: RRR,  S1/S2 +, no murmurs, no gallops, no carotid bruit.  Pulmonary: Effort and breath sounds normal, no stridor, rhonchi, wheezes, rales.  Abdominal: Soft. BS +, no distension, tenderness, rebound or guarding.  Musculoskeletal: Normal range of motion. No edema and no tenderness.  Lymphadenopathy: No lymphadenopathy noted, cervical, inguinal or axillary Neuro: Alert. Normal reflexes, muscle tone coordination. No cranial nerve deficit. Skin: Skin is warm and dry. No rash noted. Not diaphoretic. No erythema. No pallor. Psychiatric: Normal mood and affect. Behavior, judgment, thought content normal.  Assessment & Plan:  1. Prediabetes, 5.9, encourage diet and routine physical activity as patient will need to make lifestyle changes in facilitate weight-loss. Educated about appropriate food selections and portion control. Discontinue sodas and exchanged for flavored water.  2. Need for immunization against influenza- Flu Vaccine QUAD 36+ mos IM  3. Need for Tdap vaccination- Tdap vaccine greater than or equal to 7yo IM  4. Screening for thyroid disorder-Thyroid Panel With TSH  5. Screening for blood or protein in urine- Urine Culture  6. Leukocytosis, unspecified type, will recheck CBC w/differential . Prior CBC 7 months prior WBC 15.3, although patient had sinusitis. If CBC remains elevated, will refer to Hematology for second opinion.    7. Morbid Obesity, current Body mass index is 42.94 kg/m. -Goal BMI  <25. Encouraged efforts to reduce weight include engaging in physical activity as tolerated with goal of 150 minutes per week. Improve dietary choices and eat a meal regimen consistent with a Mediterranean or DASH diet. Reduce simple carbohydrates. Do not skip meals and eat healthy snacks throughout the day to avoid over-eating at dinner. Set a goal weight loss that is achievable for you.    Orders Placed This Encounter  Procedures  . Urine Culture  . Flu Vaccine QUAD 36+ mos IM  . Tdap  vaccine greater than or equal to 7yo IM  . CBC with Differential  . Comprehensive metabolic panel  . Thyroid Panel With TSH  . Ambulatory referral to Hematology  . POCT glycosylated hemoglobin (Hb A1C)  . POCT urinalysis dip (device)     RTC: Follow-up as needed for PAP as uncertain of time frame of last exam.     Godfrey PickKimberly S. Tiburcio PeaHarris, MSN, FNP-C [facility]  [address] [place], [place] [address] [phone]

## 2018-01-16 ENCOUNTER — Telehealth: Payer: Self-pay | Admitting: Family Medicine

## 2018-01-16 LAB — CBC WITH DIFFERENTIAL/PLATELET
Basophils Absolute: 0 10*3/uL (ref 0.0–0.2)
Basos: 0 %
EOS (ABSOLUTE): 0.2 10*3/uL (ref 0.0–0.4)
EOS: 2 %
HEMOGLOBIN: 14.3 g/dL (ref 11.1–15.9)
Hematocrit: 43.1 % (ref 34.0–46.6)
IMMATURE GRANS (ABS): 0 10*3/uL (ref 0.0–0.1)
IMMATURE GRANULOCYTES: 0 %
LYMPHS: 28 %
Lymphocytes Absolute: 3.4 10*3/uL — ABNORMAL HIGH (ref 0.7–3.1)
MCH: 28.8 pg (ref 26.6–33.0)
MCHC: 33.2 g/dL (ref 31.5–35.7)
MCV: 87 fL (ref 79–97)
MONOCYTES: 7 %
Monocytes Absolute: 0.8 10*3/uL (ref 0.1–0.9)
NEUTROS PCT: 63 %
Neutrophils Absolute: 7.6 10*3/uL — ABNORMAL HIGH (ref 1.4–7.0)
PLATELETS: 244 10*3/uL (ref 150–379)
RBC: 4.97 x10E6/uL (ref 3.77–5.28)
RDW: 14.3 % (ref 12.3–15.4)
WBC: 12.1 10*3/uL — ABNORMAL HIGH (ref 3.4–10.8)

## 2018-01-16 LAB — COMPREHENSIVE METABOLIC PANEL
A/G RATIO: 1.7 (ref 1.2–2.2)
ALT: 18 IU/L (ref 0–32)
AST: 14 IU/L (ref 0–40)
Albumin: 4 g/dL (ref 3.5–5.5)
Alkaline Phosphatase: 55 IU/L (ref 39–117)
BUN/Creatinine Ratio: 14 (ref 9–23)
BUN: 12 mg/dL (ref 6–20)
Bilirubin Total: 0.3 mg/dL (ref 0.0–1.2)
CALCIUM: 8.6 mg/dL — AB (ref 8.7–10.2)
CO2: 20 mmol/L (ref 20–29)
Chloride: 110 mmol/L — ABNORMAL HIGH (ref 96–106)
Creatinine, Ser: 0.88 mg/dL (ref 0.57–1.00)
GFR, EST AFRICAN AMERICAN: 101 mL/min/{1.73_m2} (ref >59)
GFR, EST NON AFRICAN AMERICAN: 87 mL/min/{1.73_m2} (ref >59)
Globulin, Total: 2.4 g/dL (ref 1.5–4.5)
Glucose: 91 mg/dL (ref 65–99)
POTASSIUM: 4.3 mmol/L (ref 3.5–5.2)
Sodium: 143 mmol/L (ref 134–144)
TOTAL PROTEIN: 6.4 g/dL (ref 6.0–8.5)

## 2018-01-16 LAB — THYROID PANEL WITH TSH
Free Thyroxine Index: 1.7 (ref 1.2–4.9)
T3 UPTAKE RATIO: 26 % (ref 24–39)
T4, Total: 6.4 ug/dL (ref 4.5–12.0)
TSH: 0.978 u[IU]/mL (ref 0.450–4.500)

## 2018-01-16 NOTE — Telephone Encounter (Signed)
Advise patient that recent lab work continues to show an elevated white count, therefore I'm going to refer her to a hematologist for further work-up as to the cause of elevated WBC.  Calcium level is low, I encourage intake of foods or beverages fortified with calcium. She can also take oral supplements over the counter.    Godfrey PickKimberly S. Tiburcio PeaHarris, MSN, FNP-C [facility]  [address] [place], [place] [address] [phone]

## 2018-01-17 DIAGNOSIS — D72829 Elevated white blood cell count, unspecified: Secondary | ICD-10-CM | POA: Insufficient documentation

## 2018-01-17 DIAGNOSIS — R7303 Prediabetes: Secondary | ICD-10-CM | POA: Insufficient documentation

## 2018-01-17 LAB — URINE CULTURE

## 2018-01-17 NOTE — Telephone Encounter (Signed)
Patient notified

## 2018-01-17 NOTE — Telephone Encounter (Signed)
Left a vm for patient to callback 

## 2018-01-18 ENCOUNTER — Telehealth: Payer: Self-pay | Admitting: Oncology

## 2018-01-18 ENCOUNTER — Encounter: Payer: Self-pay | Admitting: Oncology

## 2018-01-18 NOTE — Telephone Encounter (Signed)
Appt has been scheduled for the pt to see Dr. Clelia CroftShadad on 4/12 at 2pm. Pt aware to arrive 30 minutes early. Letter mailed.

## 2018-02-08 ENCOUNTER — Inpatient Hospital Stay: Payer: 59 | Attending: Oncology | Admitting: Oncology

## 2018-02-08 ENCOUNTER — Telehealth: Payer: Self-pay | Admitting: Oncology

## 2018-02-08 VITALS — BP 133/77 | HR 98 | Temp 98.8°F | Resp 18 | Ht 65.75 in | Wt 263.1 lb

## 2018-02-08 DIAGNOSIS — F17210 Nicotine dependence, cigarettes, uncomplicated: Secondary | ICD-10-CM | POA: Diagnosis not present

## 2018-02-08 DIAGNOSIS — D72829 Elevated white blood cell count, unspecified: Secondary | ICD-10-CM | POA: Insufficient documentation

## 2018-02-08 NOTE — Telephone Encounter (Signed)
Scheduled appt per 4/12 los - Gave patient AVS and calender per los.  

## 2018-02-08 NOTE — Progress Notes (Signed)
Reason for Referral: Leukocytosis.  HPI: 32 year old woman currently of [place].  She has no significant comorbid conditions and was recently seen by her primary care provider to establish care.  On January 15, 2018 she had a CBC done which showed a white cell count of 12.1, hemoglobin 14.3 and a platelet count of 244.  She had a normal differential at the time.  Previous CBC obtained in July 2018 showed a white cell count of 15.3 with normal differential.  She is asymptomatic from this finding without any recent infections or hospitalization.  She does have seasonal allergies and continues to smoke close to half a pack a day.  He has been told that she has leukocytosis in 2017.  Thank you thank you  She does not report any headaches, blurry vision, syncope or seizures. Does not report any fevers, chills or sweats.  Does not report any cough, wheezing or hemoptysis.  Does not report any chest pain, palpitation, orthopnea or leg edema.  Does not report any nausea, vomiting or abdominal pain.  Does not report any constipation or diarrhea.  Does not report any skeletal complaints.    Does not report frequency, urgency or hematuria.  Does not report any skin rashes or lesions. Does not report any heat or cold intolerance.  Does not report any lymphadenopathy or petechiae.  Does not report any anxiety or depression.  Remaining review of systems is negative.    No past medical history on file.:  Past Surgical History:  Procedure Laterality Date  . DENTAL SURGERY    . TUBAL LIGATION  2 years ago  :  No current outpatient medications on file.:  No Known Allergies:  Family History  Problem Relation Age of Onset  . Hypertension Mother   . Arthritis Mother   . Fibromyalgia Mother   . Other Father        no health concern  :  Social History   Socioeconomic History  . Marital status: Single    Spouse name: Not on file  . Number of children: Not on file  . Years of education: Not on file  .  Highest education level: Not on file  Occupational History  . Not on file  Social Needs  . Financial resource strain: Not on file  . Food insecurity:    Worry: Not on file    Inability: Not on file  . Transportation needs:    Medical: Not on file    Non-medical: Not on file  Tobacco Use  . Smoking status: Current Every Day Smoker    Packs/day: 0.50    Types: Cigarettes  . Smokeless tobacco: Never Used  Substance and Sexual Activity  . Alcohol use: Yes    Comment: occasionally  . Drug use: No  . Sexual activity: Not on file  Lifestyle  . Physical activity:    Days per week: Not on file    Minutes per session: Not on file  . Stress: Not on file  Relationships  . Social connections:    Talks on phone: Not on file    Gets together: Not on file    Attends religious service: Not on file    Active member of club or organization: Not on file    Attends meetings of clubs or organizations: Not on file    Relationship status: Not on file  . Intimate partner violence:    Fear of current or ex partner: Not on file    Emotionally abused: Not on  file    Physically abused: Not on file    Forced sexual activity: Not on file  Other Topics Concern  . Not on file  Social History Narrative  . Not on file  :  Pertinent items are noted in HPI.  Exam:  General appearance: alert and cooperative appeared without distress. Head: atraumatic without any abnormalities. Eyes: conjunctivae/corneas clear. PERRL.  Sclera anicteric. Throat: lips, mucosa, and tongue normal; without oral thrush or ulcers. Resp: clear to auscultation bilaterally without rhonchi, wheezes or dullness to percussion. Cardio: regular rate and rhythm, S1, S2 normal, no murmur, click, rub or gallop GI: soft, non-tender; bowel sounds normal; no masses,  no organomegaly Skin: Skin color, texture, turgor normal. No rashes or lesions Lymph nodes: Cervical, supraclavicular, and axillary nodes normal. Neurologic: Grossly  normal without any motor, sensory or deep tendon reflexes. Musculoskeletal: No joint deformity or effusion.  CBC    Component Value Date/Time   WBC 12.1 (H) 01/15/2018 1608   WBC 15.3 (H) 05/27/2017 1806   RBC 4.97 01/15/2018 1608   RBC 5.12 (H) 05/27/2017 1806   HGB 14.3 01/15/2018 1608   HCT 43.1 01/15/2018 1608   PLT 244 01/15/2018 1608   MCV 87 01/15/2018 1608   MCH 28.8 01/15/2018 1608   MCH 28.5 05/27/2017 1806   MCHC 33.2 01/15/2018 1608   MCHC 34.0 05/27/2017 1806   RDW 14.3 01/15/2018 1608   LYMPHSABS 3.4 (H) 01/15/2018 1608   EOSABS 0.2 01/15/2018 1608   BASOSABS 0.0 01/15/2018 1608     Assessment and Plan:   32 year old woman with the following issues:  1.  Leukocytosis and has been documented on 2 separate occasions starting in July 2018.  At that time her white cell count was 15.3 and in March 2019 her white cell count was 12.1.  On both occasions she had a normal CBC otherwise and normal differential.  The differential diagnosis was discussed today with the patient which include reactive findings at this time.  Smoking as well as chronic inflammatory conditions could be contributing to her mild leukocytosis.  Primary hematological disorder is considered unlikely.  Chronic myelogenous leukemia, myelofibrosis or lymphoproliferative disorder are considered unlikely.  From a management standpoint, I recommended repeating CBC in 6 months.  If her white cell count continues to be close to the normal range as it is now, no further testing is needed.    Routine physical examination is recommended moving forward by her primary care physician.  2.  Age-appropriate cancer screening: Recommended that she continues to follow with her primary care provider regarding this issue.  Also discussed smoking cessation at this time.  3.  Follow-up: Will be determined depending on the results of her future laboratory testing.  30  minutes was spent with the patient face-to-face today.   More than 50% of time was dedicated to patient counseling, education and coordination of the patient's multifaceted care.

## 2018-07-18 ENCOUNTER — Encounter: Payer: Self-pay | Admitting: Family Medicine

## 2018-07-18 ENCOUNTER — Ambulatory Visit (INDEPENDENT_AMBULATORY_CARE_PROVIDER_SITE_OTHER): Payer: Self-pay | Admitting: Family Medicine

## 2018-07-18 VITALS — BP 140/96 | HR 80 | Temp 98.6°F | Resp 14 | Ht 65.75 in | Wt 267.0 lb

## 2018-07-18 DIAGNOSIS — L732 Hidradenitis suppurativa: Secondary | ICD-10-CM

## 2018-07-18 DIAGNOSIS — R7303 Prediabetes: Secondary | ICD-10-CM

## 2018-07-18 DIAGNOSIS — J0100 Acute maxillary sinusitis, unspecified: Secondary | ICD-10-CM

## 2018-07-18 MED ORDER — CEPHALEXIN 500 MG PO CAPS: 500.0000 mg | ORAL_CAPSULE | Freq: Two times a day (BID) | ORAL | 0 refills | Status: AC

## 2018-07-18 MED ORDER — CHLORHEXIDINE GLUCONATE 4 % EX LIQD: Freq: Every day | CUTANEOUS | 0 refills | Status: DC | PRN

## 2018-07-18 MED ORDER — CETIRIZINE HCL 10 MG PO TABS: 10.0000 mg | ORAL_TABLET | Freq: Every day | ORAL | 11 refills | Status: DC

## 2018-07-18 MED ORDER — PREDNISONE 20 MG PO TABS: 60.0000 mg | ORAL_TABLET | Freq: Every day | ORAL | 0 refills | Status: AC

## 2018-07-18 MED ORDER — FLUCONAZOLE 150 MG PO TABS: 150.0000 mg | ORAL_TABLET | Freq: Once | ORAL | 0 refills | Status: AC

## 2018-07-18 MED ORDER — FLUTICASONE PROPIONATE 50 MCG/ACT NA SUSP: 2.0000 | Freq: Every day | NASAL | 6 refills | Status: DC

## 2018-07-18 MED FILL — FLUCONAZOLE 150 MG TABS: 150 | 1 days supply | Qty: 1 | Fill #0

## 2018-07-18 MED FILL — CEPHALEXIN 500 MG CAPSULE: 500 | 10 days supply | Qty: 20 | Fill #0

## 2018-07-18 MED FILL — FLUTICASONE PROP 50 MCG SPR: 50 | 30 days supply | Qty: 16 | Fill #0

## 2018-07-18 MED FILL — predniSONE 20 MG TABS: 20 | 9 days supply | Qty: 9 | Fill #0

## 2018-07-18 NOTE — Patient Instructions (Signed)
Sinusitis, Adult Sinusitis is soreness and inflammation of your sinuses. Sinuses are hollow spaces in the bones around your face. They are located:  Around your eyes.  In the middle of your forehead. Behind your nose.Hidradenitis Suppurativa Hidradenitis suppurativa is a long-term (chronic) skin disease that starts with blocked sweat glands or hair follicles. Bacteria may grow in these blocked openings of your skin. Hidradenitis suppurativa is like a severe form of acne that develops in areas of your body where acne would be unusual. It is most likely to affect the areas of your body where skin rubs against skin and becomes moist. This includes your: Underarms. Groin. Genital areas. Buttocks. Upper thighs. Breasts.  Hidradenitis suppurativa may start out with small pimples. The pimples can develop into deep sores that break open (rupture) and drain pus. Over time your skin may thicken and become scarred. Hidradenitis suppurativa cannot be passed from person to person. What are the causes? The exact cause of hidradenitis suppurativa is not known. This condition may be due to: Female and female hormones. The condition is rare before and after puberty. An overactive body defense system (immune system). Your immune system may overreact to the blocked hair follicles or sweat glands and cause swelling and pus-filled sores.  What increases the risk? You may have a higher risk of hidradenitis suppurativa if you: Are a woman. Are between ages 68 and 49. Have a family history of hidradenitis suppurativa. Have a personal history of acne. Are overweight. Smoke. Take the drug lithium.  What are the signs or symptoms? The first signs of an outbreak are usually painful skin bumps that look like pimples. As the condition progresses: Skin bumps may get bigger and grow deeper into the skin. Bumps under the skin may rupture and drain smelly pus. Skin may become itchy and infected. Skin may thicken and  scar. Drainage may continue through tunnels under the skin (fistulas). Walking and moving your arms can become painful.  How is this diagnosed? Your health care provider may diagnose hidradenitis suppurativa based on your medical history and your signs and symptoms. A physical exam will also be done. You may need to see a health care provider who specializes in skin diseases (dermatologist). You may also have tests done to confirm the diagnosis. These can include: Swabbing a sample of pus or drainage from your skin so it can be sent to the lab and tested for infection. Blood tests to check for infection.  How is this treated? The same treatment will not work for everybody with hidradenitis suppurativa. Your treatment will depend on how severe your symptoms are. You may need to try several treatments to find what works best for you. Part of your treatment may include cleaning and bandaging (dressing) your wounds. You may also have to take medicines, such as the following: Antibiotics. Acne medicines. Medicines to block or suppress the immune system. A diabetes medicine (metformin) is sometimes used to treat this condition. For women, birth control pills can sometimes help relieve symptoms.  You may need surgery if you have a severe case of hidradenitis suppurativa that does not respond to medicine. Surgery may involve: Using a laser to clear the skin and remove hair follicles. Opening and draining deep sores. Removing the areas of skin that are diseased and scarred.  Follow these instructions at home: Learn as much as you can about your disease, and work closely with your health care providers. Take medicines only as directed by your health care provider. If you  were prescribed an antibiotic medicine, finish it all even if you start to feel better. If you are overweight, losing weight may be very helpful. Try to reach and maintain a healthy weight. Do not use any tobacco products, including  cigarettes, chewing tobacco, or electronic cigarettes. If you need help quitting, ask your health care provider. Do not shave the areas where you get hidradenitis suppurativa. Do not wear deodorant. Wear loose-fitting clothes. Try not to overheat and get sweaty. Take a daily bleach bath as directed by your health care provider. Fill your bathtub halfway with water. Pour in  cup of unscented household bleach. Soak for 5-10 minutes. Cover sore areas with a warm, clean washcloth (compress) for 5-10 minutes. Contact a health care provider if: You have a flare-up of hidradenitis suppurativa. You have chills or a fever. You are having trouble controlling your symptoms at home. This information is not intended to replace advice given to you by your health care provider. Make sure you discuss any questions you have with your health care provider. Document Released: 05/30/2004 Document Revised: 03/23/2016 Document Reviewed: 01/16/2014 Elsevier Interactive Patient Education  [employer].    In your cheekbones.  Your sinuses and nasal passages are lined with a stringy fluid (mucus). Mucus normally drains out of your sinuses. When your nasal tissues get inflamed or swollen, the mucus can get trapped or blocked so air cannot flow through your sinuses. This lets bacteria, viruses, and funguses grow, and that leads to infection. Follow these instructions at home: Medicines  Take, use, or apply over-the-counter and prescription medicines only as told by your doctor. These may include nasal sprays.  If you were prescribed an antibiotic medicine, take it as told by your doctor. Do not stop taking the antibiotic even if you start to feel better. Hydrate and Humidify  Drink enough water to keep your pee (urine) clear or pale yellow.  Use a cool mist humidifier to keep the humidity level in your home above 50%.  Breathe in steam for 10-15 minutes, 3-4 times a day or as told by your doctor. You  can do this in the bathroom while a hot shower is running.  Try not to spend time in cool or dry air. Rest  Rest as much as possible.  Sleep with your head raised (elevated).  Make sure to get enough sleep each night. General instructions  Put a warm, moist washcloth on your face 3-4 times a day or as told by your doctor. This will help with discomfort.  Wash your hands often with soap and water. If there is no soap and water, use hand sanitizer.  Do not smoke. Avoid being around people who are smoking (secondhand smoke).  Keep all follow-up visits as told by your doctor. This is important. Contact a doctor if:  You have a fever.  Your symptoms get worse.  Your symptoms do not get better within 10 days. Get help right away if:  You have a very bad headache.  You cannot stop throwing up (vomiting).  You have pain or swelling around your face or eyes.  You have trouble seeing.  You feel confused.  Your neck is stiff.  You have trouble breathing. This information is not intended to replace advice given to you by your health care provider. Make sure you discuss any questions you have with your health care provider. Document Released: 04/03/2008 Document Revised: 06/11/2016 Document Reviewed: 08/11/2015 Elsevier Interactive Patient Education  2018 [employer]. Mediterranean Diet  A Mediterranean diet refers to food and lifestyle choices that are based on the traditions of countries located on the [employer]. This way of eating has been shown to help prevent certain conditions and improve outcomes for people who have chronic diseases, like kidney disease and heart disease. What are tips for following this plan? Lifestyle  Cook and eat meals together with your family, when possible.  Drink enough fluid to keep your urine clear or pale yellow.  Be physically active every day. This includes: ? Aerobic exercise like running or swimming. ? Leisure activities like  gardening, walking, or housework.  Get 7-8 hours of sleep each night.  If recommended by your health care provider, drink red wine in moderation. This means 1 glass a day for nonpregnant women and 2 glasses a day for men. A glass of wine equals 5 oz (150 mL). Reading food labels  Check the serving size of packaged foods. For foods such as rice and pasta, the serving size refers to the amount of cooked product, not dry.  Check the total fat in packaged foods. Avoid foods that have saturated fat or trans fats.  Check the ingredients list for added sugars, such as corn syrup. Shopping  At the grocery store, buy most of your food from the areas near the walls of the store. This includes: ? Fresh fruits and vegetables (produce). ? Grains, beans, nuts, and seeds. Some of these may be available in unpackaged forms or large amounts (in bulk). ? Fresh seafood. ? Poultry and eggs. ? Low-fat dairy products.  Buy whole ingredients instead of prepackaged foods.  Buy fresh fruits and vegetables in-season from local farmers markets.  Buy frozen fruits and vegetables in resealable bags.  If you do not have access to quality fresh seafood, buy precooked frozen shrimp or canned fish, such as tuna, salmon, or sardines.  Buy small amounts of raw or cooked vegetables, salads, or olives from the deli or salad bar at your store.  Stock your pantry so you always have certain foods on hand, such as olive oil, canned tuna, canned tomatoes, rice, pasta, and beans. Cooking  Cook foods with extra-virgin olive oil instead of using butter or other vegetable oils.  Have meat as a side dish, and have vegetables or grains as your main dish. This means having meat in small portions or adding small amounts of meat to foods like pasta or stew.  Use beans or vegetables instead of meat in common dishes like chili or lasagna.  Experiment with different cooking methods. Try roasting or broiling vegetables instead of  steaming or sauteing them.  Add frozen vegetables to soups, stews, pasta, or rice.  Add nuts or seeds for added healthy fat at each meal. You can add these to yogurt, salads, or vegetable dishes.  Marinate fish or vegetables using olive oil, lemon juice, garlic, and fresh herbs. Meal planning  Plan to eat 1 vegetarian meal one day each week. Try to work up to 2 vegetarian meals, if possible.  Eat seafood 2 or more times a week.  Have healthy snacks readily available, such as: ? Vegetable sticks with hummus. ? [place] yogurt. ? Fruit and nut trail mix.  Eat balanced meals throughout the week. This includes: ? Fruit: 2-3 servings a day ? Vegetables: 4-5 servings a day ? Low-fat dairy: 2 servings a day ? Fish, poultry, or lean meat: 1 serving a day ? Beans and legumes: 2 or more servings a week ? Nuts and  seeds: 1-2 servings a day ? Whole grains: 6-8 servings a day ? Extra-virgin olive oil: 3-4 servings a day  Limit red meat and sweets to only a few servings a month What are my food choices?  Mediterranean diet ? Recommended ? Grains: Whole-grain pasta. Brown rice. Bulgar wheat. Polenta. Couscous. Whole-wheat bread. [place] Cobbatmeal. Quinoa. ? Vegetables: Artichokes. Beets. Broccoli. Cabbage. Carrots. Eggplant. Green beans. Chard. Kale. Spinach. Onions. Leeks. Peas. Squash. Tomatoes. Peppers. Radishes. ? Fruits: Apples. Apricots. Avocado. Berries. Bananas. Cherries. Dates. Figs. Grapes. Lemons. Melon. Oranges. Peaches. Plums. Pomegranate. ? Meats and other protein foods: Beans. Almonds. Sunflower seeds. Pine nuts. Peanuts. Cod. Salmon. Scallops. Shrimp. Tuna. Tilapia. Clams. Oysters. Eggs. ? Dairy: Low-fat milk. Cheese. Greek yogurt. ? Beverages: Water. Red wine. Herbal tea. ? Fats and oils: Extra virgin olive oil. Avocado oil. Grape seed oil. ? Sweets and desserts: [place] yogurt with honey. Baked apples. Poached pears. Trail mix. ? Seasoning and other foods: Basil. Cilantro. Coriander.  Cumin. Mint. Parsley. Sage. Rosemary. Tarragon. Garlic. Oregano. Thyme. Pepper. Balsalmic vinegar. Tahini. Hummus. Tomato sauce. Olives. Mushrooms. ? Limit these ? Grains: Prepackaged pasta or rice dishes. Prepackaged cereal with added sugar. ? Vegetables: Deep fried potatoes (french fries). ? Fruits: Fruit canned in syrup. ? Meats and other protein foods: Beef. Pork. Lamb. Poultry with skin. Hot dogs. Tomasa BlaseBacon. ? Dairy: Ice cream. Sour cream. Whole milk. ? Beverages: Juice. Sugar-sweetened soft drinks. Beer. Liquor and spirits. ? Fats and oils: Butter. [place] oil. Vegetable oil. Beef fat (tallow). Lard. ? Sweets and desserts: Cookies. Cakes. Pies. Candy. ? Seasoning and other foods: Mayonnaise. Premade sauces and marinades. ? The items listed may not be a complete list. Talk with your dietitian about what dietary choices are right for you. Summary  The Mediterranean diet includes both food and lifestyle choices.  Eat a variety of fresh fruits and vegetables, beans, nuts, seeds, and whole grains.  Limit the amount of red meat and sweets that you eat.  Talk with your health care provider about whether it is safe for you to drink red wine in moderation. This means 1 glass a day for nonpregnant women and 2 glasses a day for men. A glass of wine equals 5 oz (150 mL). This information is not intended to replace advice given to you by your health care provider. Make sure you discuss any questions you have with your health care provider. Document Released: 06/08/2016 Document Revised: 07/11/2016 Document Reviewed: 06/08/2016 Elsevier Interactive Patient Education  [employer]2018 Else[employer] Inc.

## 2018-07-18 NOTE — Progress Notes (Signed)
[facility]   Progress Note: General Provider: Mike Gip, FNP  SUBJECTIVE:   Ariana Rogers is a 32 y.o. female who  has no past medical history on file.. Patient presents today for Recurrent Skin Infections (under arms and in groin ) and Sinus Problem  Patient states that she ran out of flonase and allergy medications. Having nasal congestion and rhinorrhea. Denies fever and chills  Review of Systems  Constitutional: Negative.  Negative for fever.  HENT: Positive for congestion and sinus pain. Negative for ear pain.   Eyes: Negative.   Respiratory: Negative.   Cardiovascular: Negative.   Gastrointestinal: Negative.   Genitourinary: Negative.   Musculoskeletal: Negative.   Skin: Negative.   Neurological: Negative.   Psychiatric/Behavioral: Negative.      OBJECTIVE: BP (!) 140/96 (BP Location: Right Arm, Patient Position: Sitting, Cuff Size: Large)   Pulse 80   Temp 98.6 F (37 C) (Oral)   Resp 14   Ht 5' 5.75" (1.67 m)   Wt 267 lb (121.1 kg)   LMP 07/14/2018   SpO2 100%   BMI 43.42 kg/m   Physical Exam  Constitutional: She is oriented to person, place, and time. She appears well-developed and well-nourished. No distress.  HENT:  Head: Normocephalic and atraumatic.  Right Ear: Hearing and tympanic membrane normal.  Left Ear: Hearing and tympanic membrane normal.  Nose: Mucosal edema and rhinorrhea present. Right sinus exhibits maxillary sinus tenderness and frontal sinus tenderness. Left sinus exhibits maxillary sinus tenderness and frontal sinus tenderness.  Mouth/Throat: Uvula is midline, oropharynx is clear and moist and mucous membranes are normal.  Eyes: Pupils are equal, round, and reactive to light. Conjunctivae and EOM are normal.  Neck: Normal range of motion. Neck supple.  Cardiovascular: Normal rate, regular rhythm, normal heart sounds and intact distal pulses.  Pulmonary/Chest: Effort normal and breath  sounds normal. No respiratory distress.  Musculoskeletal: Normal range of motion.  Lymphadenopathy:    She has no cervical adenopathy.  Neurological: She is alert and oriented to person, place, and time.  Skin:  Several healing abscesses noted to the abdomen, lower back and bilateral axilla. There are no active lesions.     ASSESSMENT/PLAN:  1. Prediabetes The patient is asked to make an attempt to improve diet and exercise patterns to aid in medical management of this problem.  2. Subacute maxillary sinusitis - cephALEXin (KEFLEX) 500 MG capsule; Take 1 capsule (500 mg total) by mouth 2 (two) times daily for 10 days.  Dispense: 20 capsule; Refill: 0 - predniSONE (DELTASONE) 20 MG tablet; Take 3 tablets (60 mg total) by mouth daily with breakfast for 3 days.  Dispense: 9 tablet; Refill: 0  3. Hydradenitis - chlorhexidine (HIBICLENS) 4 % external liquid; Apply topically daily as needed. Wash twice weekly in the shower  Dispense: 120 mL; Refill: 0 - fluconazole (DIFLUCAN) 150 MG tablet; Take 1 tablet (150 mg total) by mouth once for 1 dose.  Dispense: 1 tablet; Refill: 0       The patient was given clear instructions to go to ER or return to medical center if symptoms do not improve, worsen or new problems develop. The patient verbalized understanding and agreed with plan of care.   Ms. Ariana Rogers. Ariana Lam, FNP-BC [facility] [address] [place], [place] [address] [phone]      This note has been created with Dragon speech recognition software and smart phrase technology.  Any transcriptional errors are unintentional.

## 2018-07-23 ENCOUNTER — Encounter: Payer: Self-pay | Admitting: Family Medicine

## 2018-07-23 ENCOUNTER — Telehealth: Payer: Self-pay

## 2018-07-23 ENCOUNTER — Ambulatory Visit (INDEPENDENT_AMBULATORY_CARE_PROVIDER_SITE_OTHER): Payer: Self-pay | Admitting: Family Medicine

## 2018-07-23 VITALS — BP 140/96 | HR 84 | Temp 98.7°F | Ht 65.75 in | Wt 262.4 lb

## 2018-07-23 DIAGNOSIS — N898 Other specified noninflammatory disorders of vagina: Secondary | ICD-10-CM

## 2018-07-23 DIAGNOSIS — Z124 Encounter for screening for malignant neoplasm of cervix: Secondary | ICD-10-CM

## 2018-07-23 DIAGNOSIS — N390 Urinary tract infection, site not specified: Secondary | ICD-10-CM

## 2018-07-23 DIAGNOSIS — Z09 Encounter for follow-up examination after completed treatment for conditions other than malignant neoplasm: Secondary | ICD-10-CM

## 2018-07-23 DIAGNOSIS — R319 Hematuria, unspecified: Secondary | ICD-10-CM

## 2018-07-23 DIAGNOSIS — R8290 Unspecified abnormal findings in urine: Secondary | ICD-10-CM

## 2018-07-23 DIAGNOSIS — Z01419 Encounter for gynecological examination (general) (routine) without abnormal findings: Secondary | ICD-10-CM

## 2018-07-23 LAB — POCT URINALYSIS DIP (MANUAL ENTRY)
Bilirubin, UA: NEGATIVE
Glucose, UA: NEGATIVE mg/dL
Ketones, POC UA: NEGATIVE mg/dL
Nitrite, UA: NEGATIVE
Spec Grav, UA: >=1.03 — AB
Urobilinogen, UA: 0.2 U/dL
pH, UA: 6

## 2018-07-23 MED ORDER — SULFAMETHOXAZOLE-TRIMETHOPRIM 800-160 MG PO TABS: 1.0000 | ORAL_TABLET | Freq: Two times a day (BID) | ORAL | 0 refills | Status: DC

## 2018-07-23 NOTE — Telephone Encounter (Signed)
Patient notified

## 2018-07-23 NOTE — Telephone Encounter (Signed)
-----   Message from Kallie LocksNatalie M Stroud, FNP sent at 07/23/2018  1:33 PM EDT ----- Regarding: "UTI" Ariana Rogers,   Please inform patient that her urine is positive for bacteria. Rx for Septra to pharmacy today. We will do Urine Culture to further evaluate if different antibiotic is warranted. Complete all medication.    Thank you.

## 2018-07-23 NOTE — Telephone Encounter (Signed)
Tried to call back; no answer.

## 2018-07-23 NOTE — Progress Notes (Signed)
Pap Smear  Subjective:    Patient ID: Leontine LocketShanon Mosely, female    DOB: 17-Nov-1985, 32 y.o.   MRN: [id number]   Chief Complaint  Patient presents with  . Gynecologic Exam    HPI  Ms. Stallone is a year old female with no pertinent past medical history. She is here today for Pap Smear.  Current Status: Patient here for routine gynecological exam. She has previously had routine Pap smears. Patient states that she has had an abnormal Pap smear when she was 32 years old. She did have follow up Colposcopy that resulted negative. She has 4 children. She is sexually active. She denies abnormal vaginal discharge, vaginal itching, vaginal burning, or dyspareunia.  Patient states that she does not perform monthly self breast exams. She does have any family history of cancer but is unsure as to what cancer type. She typically follows a balanced diet but does not exercise routinely. Body mass index is 42.68.  Gynecologic History Patient's last menstrual period was 06/2018.  Obstetric History        OB History  Gravida Para Term Preterm AB Living  4 0 3 1 0 0  SAB TAB Ectopic Multiple Live Births   0 0 0 0 0     Her last menstrual period was 06/2018.    Regular periods usually last 5-7 days.   She states that she has vaginal odor and abdominal pain. No discharge, discomfort, and irregular bleeding.  She is sexually active, and she underwent Tubal Ligation 08/2010.  Review of Systems  Cardiovascular: Negative.   Gastrointestinal: Positive for abdominal pain.  Genitourinary: Negative.        Vaginal odor   Objective:   Physical Exam  Cardiovascular: Normal rate, regular rhythm, normal heart sounds and intact distal pulses.  Pulmonary/Chest: Effort normal and breath sounds normal.  Abdominal: Soft. Bowel sounds are normal.  Psychiatric: She has a normal mood and affect. Her behavior is normal. Judgment and thought content normal.   Assessment  & Plan:   1. Pap smear for cervical cancer  screening Tolerated exam well with no complaints. Vaginal odor detected during exam. Results are pending.  - Pap IG, CT/NG w/ reflex HPV when ASC[facility] [employer] [employer]  2. Encounter for gynecological examination Recommend monthly self breast exam Recommend daily multivitamin for women Recommend strength training in 150 minutes of cardiovascular exercise per week - POCT urinalysis dipstick - Pap IG, CT/NG w/ reflex HPV when ASC[facility] [employer]  3. Vaginal odor Vaginal swab results are pending.   4. Follow up He will follow up in 2 months.  No orders of the defined types were placed in this encounter.   Raliegh IpNatalie Naelle Diegel,  MSN, FNP-C [facility] [address]  [place], [place] [id number] [phone]

## 2018-07-23 NOTE — Telephone Encounter (Signed)
Patient notified to pick up medication

## 2018-07-24 MED FILL — SULFAMETHOXAZOLE-TMP DS TAB: 800-160 | 7 days supply | Qty: 14 | Fill #0

## 2018-07-25 LAB — URINE CULTURE

## 2018-07-26 LAB — BACTERIAL VAGINOSIS, NAA
Atopobium vaginae: HIGH Score — AB
BVAB 2: HIGH Score — AB
Megasphaera 1: HIGH Score — AB

## 2018-07-26 LAB — PAP IG, CT-NG, RFX HPV ASCU
Chlamydia, Nuc. Acid Amp: NEGATIVE
Gonococcus by Nucleic Acid Amp: NEGATIVE
PAP Smear Comment: 0

## 2018-08-02 ENCOUNTER — Telehealth: Payer: Self-pay

## 2018-08-02 ENCOUNTER — Other Ambulatory Visit: Payer: Self-pay | Admitting: Family Medicine

## 2018-08-02 DIAGNOSIS — B9689 Other specified bacterial agents as the cause of diseases classified elsewhere: Secondary | ICD-10-CM

## 2018-08-02 DIAGNOSIS — N760 Acute vaginitis: Principal | ICD-10-CM

## 2018-08-02 MED ORDER — METRONIDAZOLE 500 MG PO TABS: 500.0000 mg | ORAL_TABLET | Freq: Two times a day (BID) | ORAL | 0 refills | Status: AC

## 2018-08-02 NOTE — Progress Notes (Signed)
Rx for Flagyl sent to pharmacy today.  

## 2018-08-02 NOTE — Telephone Encounter (Signed)
-----   Message from Kallie Locks, FNP sent at 08/02/2018  3:30 PM EDT ----- Regarding: "Swab Results" Ariana Rogers,   Please inform patient that we have sent Rx for Flagyl to pharmacy today. Swab revealed a disruption in normal flora of vagina. She is to take medication as directed. She is to complete all medication. Most importantly, avoid Alcohol while taking Flagyl.   The most common causes of Bacterial Vaginosis are: multiple sex partners, new sexual partner, and douching.    Thank you.

## 2018-08-02 NOTE — Telephone Encounter (Signed)
Patient notified

## 2018-08-09 ENCOUNTER — Inpatient Hospital Stay: Payer: Self-pay | Attending: Family Medicine

## 2018-08-09 MED FILL — metroNIDAZOLE 500 MG TABS: 500 | 7 days supply | Qty: 14 | Fill #0

## 2018-09-23 ENCOUNTER — Ambulatory Visit: Payer: Self-pay | Admitting: Family Medicine

## 2018-11-04 ENCOUNTER — Encounter (HOSPITAL_COMMUNITY): Payer: Self-pay | Admitting: Emergency Medicine

## 2018-11-04 ENCOUNTER — Emergency Department (HOSPITAL_COMMUNITY)
Admission: EM | Admit: 2018-11-04 | Discharge: 2018-11-04 | Disposition: A | Discharge status: Home / Self Care | Payer: Self-pay | Attending: Emergency Medicine | Admitting: Emergency Medicine

## 2018-11-04 ENCOUNTER — Other Ambulatory Visit: Payer: Self-pay

## 2018-11-04 DIAGNOSIS — L988 Other specified disorders of the skin and subcutaneous tissue: Secondary | ICD-10-CM

## 2018-11-04 DIAGNOSIS — N649 Disorder of breast, unspecified: Secondary | ICD-10-CM

## 2018-11-04 DIAGNOSIS — F17210 Nicotine dependence, cigarettes, uncomplicated: Secondary | ICD-10-CM | POA: Insufficient documentation

## 2018-11-04 DIAGNOSIS — Z79899 Other long term (current) drug therapy: Secondary | ICD-10-CM | POA: Insufficient documentation

## 2018-11-04 DIAGNOSIS — B09 Unspecified viral infection characterized by skin and mucous membrane lesions: Secondary | ICD-10-CM | POA: Insufficient documentation

## 2018-11-04 MED ORDER — KETOCONAZOLE 2 % EX CREA: 1.0000 "application " | TOPICAL_CREAM | Freq: Every day | CUTANEOUS | 0 refills | Status: DC

## 2018-11-04 MED ORDER — CEPHALEXIN 500 MG PO CAPS: 500.0000 mg | ORAL_CAPSULE | Freq: Four times a day (QID) | ORAL | 0 refills | Status: AC

## 2018-11-04 MED FILL — KETOCONAZOLE 2% CREAM: 2 | 10 days supply | Qty: 15 | Fill #0

## 2018-11-04 MED FILL — CEPHALEXIN 500 MG CAPSULE: 500 | 5 days supply | Qty: 20 | Fill #0

## 2018-11-04 NOTE — ED Provider Notes (Signed)
[facility] EMERGENCY DEPARTMENT Provider Note   CSN: [id number] Arrival date & time: 11/04/18  0008     History   Chief Complaint Chief Complaint  Patient presents with  . breast wound    HPI Ariana Rogers is a 33 y.o. female with a history of prediabetes who presents to the emergency department with a chief complaint of right breast lesion.  The patient states that she noticed a small "boil" to the side of her left breast approximately 1 week ago.  She reports that the "boil" popped, and she had green drainage.  She reports the drainage resolved and to the side of the breast became red and swollen.  Reports these symptoms have since resolved, but she now has a circular wound to the right breast.  She states it is not painful.  It has not enlarged since the circular wound appeared over the last few days.  She has no history of similar or other rashes or skin lesions.  She is not pregnant or breast-feeding.  She has been keeping the area covered with cotton balls to avoid having it rub against her clothing.  No known aggravating or alleviating factors.  No family history of breast cancer.  The history is provided by the patient. No language interpreter was used.    History reviewed. No pertinent past medical history.  Patient Active Problem List   Diagnosis Date Noted  . Leukocytosis 01/17/2018  . Prediabetes 01/17/2018    Past Surgical History:  Procedure Laterality Date  . DENTAL SURGERY    . TUBAL LIGATION  2 years ago     OB History   No obstetric history on file.      Home Medications    Prior to Admission medications   Medication Sig Start Date End Date Taking? Authorizing Provider  Calcium Carbonate-Vitamin D (CALCIUM 600+D HIGH POTENCY PO) Take 1 capsule by mouth daily.    [provider]  cephALEXin (KEFLEX) 500 MG capsule Take 1 capsule (500 mg total) by mouth 4 (four) times daily for 5 days. 11/04/18 11/09/18  Maedell Hedger A, PA-C    cetirizine (ZYRTEC) 10 MG tablet Take 1 tablet (10 mg total) by mouth daily. 07/18/18   Mike Gip, FNP  chlorhexidine (HIBICLENS) 4 % external liquid Apply topically daily as needed. Wash twice weekly in the shower 07/18/18   Mike Gip, FNP  fluticasone (FLONASE SENSIMIST) 27.5 MCG/SPRAY nasal spray Place 1 spray into the nose daily.    [provider]  fluticasone (FLONASE) 50 MCG/ACT nasal spray Place 2 sprays into both nostrils daily. 07/18/18   Mike Gip, FNP  ketoconazole (NIZORAL) 2 % cream Apply 1 application topically daily. 11/04/18   Amr Sturtevant A, PA-C  ranitidine (ZANTAC) 150 MG tablet Take 150 mg by mouth 2 (two) times daily.    [provider]  sulfamethoxazole-trimethoprim (BACTRIM DS,SEPTRA DS) 800-160 MG tablet Take 1 tablet by mouth 2 (two) times daily. 07/23/18   Kallie Locks, FNP  vitamin B-12 (CYANOCOBALAMIN) 1000 MCG tablet Take 1,000 mcg by mouth daily.    [provider]    Family History Family History  Problem Relation Age of Onset  . Hypertension Mother   . Arthritis Mother   . Fibromyalgia Mother   . Other Father        no health concern    Social History Social History   Tobacco Use  . Smoking status: Current Every Day Smoker    Packs/day: 0.50  Types: Cigarettes  . Smokeless tobacco: Never Used  Substance Use Topics  . Alcohol use: Yes    Comment: occasionally  . Drug use: No     Allergies   Patient has no known allergies.   Review of Systems Review of Systems  Constitutional: Negative for activity change, chills and fever.  Respiratory: Negative for shortness of breath.   Cardiovascular: Negative for chest pain and leg swelling.  Gastrointestinal: Negative for abdominal pain.  Genitourinary: Negative for dysuria.  Musculoskeletal: Negative for back pain.  Skin: Positive for color change, rash and wound.  Allergic/Immunologic: Negative for immunocompromised state.  Neurological: Negative for  headaches.  Psychiatric/Behavioral: Negative for confusion.     Physical Exam Updated Vital Signs BP (!) 137/102 (BP Location: Left Arm)   Pulse 73   Temp 97.7 F (36.5 C) (Tympanic)   Resp 19   Ht 5' 5.75" (1.67 m)   Wt 117.9 kg   LMP 10/13/2018   SpO2 97%   BMI 42.28 kg/m   Physical Exam Vitals signs and nursing note reviewed.  Constitutional:      General: She is not in acute distress. HENT:     Head: Normocephalic.  Eyes:     Conjunctiva/sclera: Conjunctivae normal.  Neck:     Musculoskeletal: Neck supple.  Cardiovascular:     Rate and Rhythm: Normal rate and regular rhythm.     Heart sounds: No murmur. No friction rub. No gallop.   Pulmonary:     Effort: Pulmonary effort is normal. No respiratory distress.     Breath sounds: No stridor. No rhonchi or rales.  Chest:     Chest wall: No tenderness.  Abdominal:     General: There is no distension.     Palpations: Abdomen is soft.  Skin:    General: Skin is warm.     Findings: No rash.     Comments: There is a quarter-sized circular skin lesion noted to the lateral aspect of the right breast.  No tenderness to palpation.  No drainage.  There is a small amount of scaling noted to the skin around the lesion.  No warmth, erythema, or edema.  No other rashes or lesions are noted.  Breast exam is otherwise normal.  No axillary or supraclavicular lymphadenopathy bilaterally.  Neurological:     Mental Status: She is alert.  Psychiatric:        Behavior: Behavior normal.        ED Treatments / Results  Labs (all labs ordered are listed, but only abnormal results are displayed) Labs Reviewed - No data to display  EKG None  Radiology No results found.  Procedures Procedures (including critical care time)  Medications Ordered in ED Medications - No data to display   Initial Impression / Assessment and Plan / ED Course  I have reviewed the triage vital signs and the nursing notes.  Pertinent labs &  imaging results that were available during my care of the patient were reviewed by me and considered in my medical decision making (see chart for details).     33 year old female with a history of prediabetes presenting with a skin lesion to the right breast for the last week.  States the area began as a "boil" and had green drainage, which resolved.  There was swelling and redness for a couple of days, which is also resolved.  She now has a circular, nontender wound.  Discussed the patient with Dr. Preston FleetingGlick, attending physician.  I suspect she  will likely need dermatology follow-up for skin biopsy.  I question if this is an early pyoderma gangrenosum lesion.  Given scaling around the lesion, will cover her with a topical antifungal and will cover her with a 5-day course of Keflex to prevent secondary cellulitis.  Doubt mastitis.  Strict return precautions given.  She is hemodynamically stable and in no acute distress.  She is safe for discharge home with outpatient follow-up at this time.  Final Clinical Impressions(s) / ED Diagnoses   Final diagnoses:  Skin lesion of breast    ED Discharge Orders         Ordered    cephALEXin (KEFLEX) 500 MG capsule  4 times daily     11/04/18 0334    ketoconazole (NIZORAL) 2 % cream  Daily     11/04/18 0334           Barkley BoardsMcDonald, Caitlin Ainley A, PA-C 11/04/18 0405    Dione BoozeGlick, David, MD 11/04/18 [phone]

## 2018-11-04 NOTE — ED Notes (Signed)
E-signature not available, pt verbalized understanding of DC instructions and prescriptions 

## 2018-11-04 NOTE — Discharge Instructions (Addendum)
Thank you for allowing me to care for you today in the Emergency Department.   Take 1 tablet of Keflex every 6 hours for the next 5 days.  Apply a thin layer of ketoconazole cream to the wound once daily for the next 2 weeks.  If the wound does not improve with this regimen, you may need to follow-up with a dermatologist.  I have listed 2 practices above, but we do not have a direct referral to dermatology through the hospital.  You can also follow-up with your primary care provider and they can refer you to dermatology.  Return to the emergency department if you start having thick, mucus-like drainage from the area, if the right side of the breast gets red, hot to the touch, and more swollen despite taking antibiotics, if you develop high fevers, or other new, concerning symptoms.

## 2018-11-04 NOTE — ED Triage Notes (Signed)
C/o wound to R lateral breast x 1 week.  States it started out as a "small bump" and she popped it in the shower.  Now reports green discharge. Only small amount of blood noted on bandage at present.  States she just cleaned it and changed bandage.

## 2018-11-22 ENCOUNTER — Other Ambulatory Visit: Payer: Self-pay

## 2018-11-22 ENCOUNTER — Emergency Department (HOSPITAL_COMMUNITY)
Admission: EM | Admit: 2018-11-22 | Discharge: 2018-11-22 | Disposition: A | Discharge status: Home / Self Care | Payer: Self-pay | Attending: Emergency Medicine | Admitting: Emergency Medicine

## 2018-11-22 ENCOUNTER — Encounter (HOSPITAL_COMMUNITY): Payer: Self-pay | Admitting: *Deleted

## 2018-11-22 DIAGNOSIS — Z79899 Other long term (current) drug therapy: Secondary | ICD-10-CM | POA: Insufficient documentation

## 2018-11-22 DIAGNOSIS — L509 Urticaria, unspecified: Secondary | ICD-10-CM | POA: Insufficient documentation

## 2018-11-22 DIAGNOSIS — F17210 Nicotine dependence, cigarettes, uncomplicated: Secondary | ICD-10-CM | POA: Insufficient documentation

## 2018-11-22 MED ORDER — PREDNISONE 20 MG PO TABS
60.0000 mg | ORAL_TABLET | Freq: Once | ORAL | Status: AC
  Administered 2018-11-22: 60 mg via ORAL
  Filled 2018-11-22: qty 3

## 2018-11-22 MED ORDER — PREDNISONE 20 MG PO TABS: 10.0000 mg | ORAL_TABLET | Freq: Every day | ORAL | 0 refills | Status: DC

## 2018-11-22 NOTE — Discharge Instructions (Addendum)
Take prednisone as prescribed beginning on the morning of 11/23/2018.  We recommend daily use of Zyrtec or Claritin as well.  You may continue use of Benadryl as needed for persistent rash and itching.  Follow-up with your primary doctor.

## 2018-11-22 NOTE — ED Provider Notes (Addendum)
[facility] EMERGENCY DEPARTMENT Provider Note   CSN: [id number] Arrival date & time: 11/22/18  0253     History   Chief Complaint Chief Complaint  Patient presents with  . Rash    HPI Ariana Rogers is a 33 y.o. female.  The history is provided by the patient. No language interpreter was used.  Rash  Location:  Full body Quality: itchiness, redness and swelling   Quality: not painful   Severity:  Mild Onset quality:  Gradual Duration:  1 week Timing:  Intermittent Progression:  Waxing and waning Chronicity:  New Context: not chemical exposure, not exposure to similar rash, not food and not new detergent/soap   Context comment:  Took Keflex at the beginning of the month, but have had before without issue and is not currently taking Relieved by:  Antihistamines and topical steroids Associated symptoms: no fever, no hoarse voice, no shortness of breath, no throat swelling, no tongue swelling, not vomiting and not wheezing     History reviewed. No pertinent past medical history.  Patient Active Problem List   Diagnosis Date Noted  . Leukocytosis 01/17/2018  . Prediabetes 01/17/2018    Past Surgical History:  Procedure Laterality Date  . DENTAL SURGERY    . TUBAL LIGATION  2 years ago     OB History   No obstetric history on file.      Home Medications    Prior to Admission medications   Medication Sig Start Date End Date Taking? Authorizing Provider  Calcium Carbonate-Vitamin D (CALCIUM 600+D HIGH POTENCY PO) Take 1 capsule by mouth daily.    [provider]  cetirizine (ZYRTEC) 10 MG tablet Take 1 tablet (10 mg total) by mouth daily. 07/18/18   Lanae Boast, FNP  chlorhexidine (HIBICLENS) 4 % external liquid Apply topically daily as needed. Wash twice weekly in the shower 07/18/18   Lanae Boast, FNP  fluticasone (FLONASE SENSIMIST) 27.5 MCG/SPRAY nasal spray Place 1 spray into the nose daily.    [provider]    fluticasone (FLONASE) 50 MCG/ACT nasal spray Place 2 sprays into both nostrils daily. 07/18/18   Lanae Boast, FNP  ketoconazole (NIZORAL) 2 % cream Apply 1 application topically daily. 11/04/18   McDonald, Mia A, PA-C  predniSONE (DELTASONE) 20 MG tablet Take 0.5-2 tablets (10-40 mg total) by mouth daily. Take 40 mg by mouth daily for 3 days, then 20mg  by mouth daily for 6 days, then 10mg  daily for 6 days 11/22/18   Antonietta Breach, PA-C  ranitidine (ZANTAC) 150 MG tablet Take 150 mg by mouth 2 (two) times daily.    [provider]  sulfamethoxazole-trimethoprim (BACTRIM DS,SEPTRA DS) 800-160 MG tablet Take 1 tablet by mouth 2 (two) times daily. 07/23/18   Azzie Glatter, FNP  vitamin B-12 (CYANOCOBALAMIN) 1000 MCG tablet Take 1,000 mcg by mouth daily.    [provider]    Family History Family History  Problem Relation Age of Onset  . Hypertension Mother   . Arthritis Mother   . Fibromyalgia Mother   . Other Father        no health concern    Social History Social History   Tobacco Use  . Smoking status: Current Every Day Smoker    Packs/day: 0.50    Types: Cigarettes  . Smokeless tobacco: Never Used  Substance Use Topics  . Alcohol use: Yes    Comment: occasionally  . Drug use: No     Allergies   Patient  has no known allergies.   Review of Systems Review of Systems  Constitutional: Negative for fever.  HENT: Negative for hoarse voice.   Respiratory: Negative for shortness of breath and wheezing.   Gastrointestinal: Negative for vomiting.  Skin: Positive for rash.  Ten systems reviewed and are negative for acute change, except as noted in the HPI.    Physical Exam Updated Vital Signs BP (!) 143/103 (BP Location: Right Arm)   Pulse 93   Temp 98.3 F (36.8 C) (Oral)   Resp 19   SpO2 100%   Physical Exam Vitals signs and nursing note reviewed.  Constitutional:      General: She is not in acute distress.    Appearance: She is well-developed.  She is not diaphoretic.     Comments: Nontoxic appearing and in NAD  HENT:     Head: Normocephalic and atraumatic.     Mouth/Throat:     Comments: No angioedema.  Tolerating secretions without difficulty.  No tripoding or stridor.  Normal mucous membranes. Eyes:     General: No scleral icterus.    Conjunctiva/sclera: Conjunctivae normal.  Neck:     Musculoskeletal: Normal range of motion.  Cardiovascular:     Rate and Rhythm: Normal rate and regular rhythm.     Pulses: Normal pulses.  Pulmonary:     Effort: Pulmonary effort is normal. No respiratory distress.     Breath sounds: No stridor. No wheezing.     Comments: Respirations even and unlabored.  Musculoskeletal: Normal range of motion.  Skin:    General: Skin is warm and dry.     Coloration: Skin is not pale.     Findings: Rash present. No erythema.     Comments: Scattered erythematous, blanching, pruritic, maculopapular, slightly raised rash to extremities.  Neurological:     Mental Status: She is alert and oriented to person, place, and time.  Psychiatric:        Behavior: Behavior normal.      ED Treatments / Results  Labs (all labs ordered are listed, but only abnormal results are displayed) Labs Reviewed - No data to display  EKG None  Radiology No results found.  Procedures Procedures (including critical care time)  Medications Ordered in ED Medications  predniSONE (DELTASONE) tablet 60 mg (has no administration in time range)     Initial Impression / Assessment and Plan / ED Course  I have reviewed the triage vital signs and the nursing notes.  Pertinent labs & imaging results that were available during my care of the patient were reviewed by me and considered in my medical decision making (see chart for details).     Patient presents for rash consistent with urticaria.  Symptoms relieved with hydrocortisone and Benadryl, though urticaria has continued to return x1 week.  No angioedema.  Patient  is tolerating her secretions.  She has no tripoding or stridor.  No hypoxia.  Denies any new food ingestions, recent travel, new soaps, lotions, detergents.  Rash not consistent with SJS, erythema multiforme major, erythema multiforme minor.  Will start on prednisone taper.  Have advised daily use of Zyrtec or Claritin.  Encouraged primary care follow-up.  Return precautions discussed and provided. Patient discharged in stable condition with no unaddressed concerns.   Final Clinical Impressions(s) / ED Diagnoses   Final diagnoses:  Urticaria    ED Discharge Orders         Ordered    predniSONE (DELTASONE) 20 MG tablet  Daily  11/22/18 [place], Ameya Vowell, PA-C 11/22/18 0554    Antonietta Breach, PA-C 94/17/40 [id number]    Delora Fuel, MD 81/85/63 [phone]

## 2018-11-22 NOTE — ED Triage Notes (Signed)
Pt c/o rash x 1 week. Has been taking benadryl (last 50mg  around 0130). Rash generalized. Denies any new meds, products or foods.

## 2018-11-22 NOTE — ED Notes (Signed)
ED Provider at bedside. 

## 2018-12-11 ENCOUNTER — Ambulatory Visit: Payer: Self-pay | Admitting: Family Medicine

## 2018-12-26 ENCOUNTER — Other Ambulatory Visit: Payer: Self-pay

## 2018-12-26 ENCOUNTER — Emergency Department (HOSPITAL_COMMUNITY)
Admission: EM | Admit: 2018-12-26 | Discharge: 2018-12-27 | Disposition: A | Discharge status: Home / Self Care | Payer: Self-pay | Attending: Emergency Medicine | Admitting: Emergency Medicine

## 2018-12-26 ENCOUNTER — Encounter (HOSPITAL_COMMUNITY): Payer: Self-pay

## 2018-12-26 DIAGNOSIS — F17210 Nicotine dependence, cigarettes, uncomplicated: Secondary | ICD-10-CM | POA: Insufficient documentation

## 2018-12-26 DIAGNOSIS — Z79899 Other long term (current) drug therapy: Secondary | ICD-10-CM | POA: Insufficient documentation

## 2018-12-26 DIAGNOSIS — L42 Pityriasis rosea: Secondary | ICD-10-CM | POA: Insufficient documentation

## 2018-12-26 MED ORDER — PERMETHRIN 5 % EX CREA: TOPICAL_CREAM | CUTANEOUS | 1 refills | Status: DC

## 2018-12-26 MED ORDER — PREDNISONE 20 MG PO TABS: 40.0000 mg | ORAL_TABLET | Freq: Every day | ORAL | 0 refills | Status: DC

## 2018-12-26 MED ORDER — LORATADINE 10 MG PO TABS
10.0000 mg | ORAL_TABLET | Freq: Once | ORAL | Status: AC
  Administered 2018-12-27: 10 mg via ORAL
  Filled 2018-12-26: qty 1

## 2018-12-26 MED ORDER — PREDNISONE 20 MG PO TABS
60.0000 mg | ORAL_TABLET | Freq: Once | ORAL | Status: AC
  Administered 2018-12-27: 60 mg via ORAL
  Filled 2018-12-26: qty 3

## 2018-12-26 MED ORDER — CETIRIZINE HCL 10 MG PO TABS: 10.0000 mg | ORAL_TABLET | Freq: Every day | ORAL | 0 refills | Status: DC

## 2018-12-26 NOTE — ED Triage Notes (Signed)
Pt her for a rash all over her body, mostly arms chest and upper legs. Rash present for the last 3 weeks. Pt using hydrocortisone creams and benadryl with no relief.  No shortness of breath or difficulty breathing.  Pt ambulatory to triage. A&Ox4.

## 2018-12-27 NOTE — Discharge Instructions (Signed)
Your rash is consistent with pityriasis rosea.  This rash is self-limiting and will resolve on its own.  There is no medication that will make this rash resolve faster.  We recommend prednisone and Zyrtec as prescribed for management of itching.  You were given a prescription for permethrin which will cover for scabies; however there is low suspicion that scabies is what is causing your rash.  We recommend follow-up with your primary care doctor for symptom recheck.  You may follow-up with a dermatologist if you desire further evaluation.

## 2018-12-27 NOTE — ED Provider Notes (Signed)
[facility] EMERGENCY DEPARTMENT Provider Note   CSN: [id number] Arrival date & time: 12/26/18  2240    History   Chief Complaint Chief Complaint  Patient presents with  . Rash    HPI Ariana Rogers is a 33 y.o. female.     33 year old presents to the emergency department for evaluation of a rash present x3 weeks.  Rash has been on her chest, abdomen, back.  It is itching.  She has some intermittent relief with hydrocortisone cream and Benadryl, but itching will recur shortly after using these medications.  She has not had any lip or tongue swelling, shortness of breath, difficulty swallowing.  No associated fevers.  Denies new soaps, lotions, detergents.  No recent travel or hotel stays.  She denies contact with persons with similar rash.  Has follow-up with her primary care doctor on Monday, but was uncomfortable tonight due to the itching and wanted to be evaluated.  The history is provided by the patient. No language interpreter was used.  Rash    History reviewed. No pertinent past medical history.  Patient Active Problem List   Diagnosis Date Noted  . Leukocytosis 01/17/2018  . Prediabetes 01/17/2018    Past Surgical History:  Procedure Laterality Date  . DENTAL SURGERY    . TUBAL LIGATION  2 years ago     OB History   No obstetric history on file.      Home Medications    Prior to Admission medications   Medication Sig Start Date End Date Taking? Authorizing Provider  Calcium Carbonate-Vitamin D (CALCIUM 600+D HIGH POTENCY PO) Take 1 capsule by mouth daily.    [provider]  cetirizine (ZYRTEC) 10 MG tablet Take 1 tablet (10 mg total) by mouth daily. 12/26/18   Antony Madura, PA-C  chlorhexidine (HIBICLENS) 4 % external liquid Apply topically daily as needed. Wash twice weekly in the shower 07/18/18   Mike Gip, FNP  fluticasone (FLONASE SENSIMIST) 27.5 MCG/SPRAY nasal spray Place 1 spray into the nose daily.    [provider]  fluticasone (FLONASE) 50 MCG/ACT nasal spray Place 2 sprays into both nostrils daily. 07/18/18   Mike Gip, FNP  ketoconazole (NIZORAL) 2 % cream Apply 1 application topically daily. 11/04/18   McDonald, Mia A, PA-C  permethrin (ELIMITE) 5 % cream Apply to entire body other than face - let sit for 14 hours then wash off, may repeat in 1 week if still having symptoms 12/26/18   Antony Madura, PA-C  predniSONE (DELTASONE) 20 MG tablet Take 2 tablets (40 mg total) by mouth daily. Take 40 mg by mouth daily for 3 days, then 20mg  by mouth daily for 3 days, then 10mg  daily for 3 days 12/26/18   Antony Madura, PA-C  ranitidine (ZANTAC) 150 MG tablet Take 150 mg by mouth 2 (two) times daily.    [provider]  sulfamethoxazole-trimethoprim (BACTRIM DS,SEPTRA DS) 800-160 MG tablet Take 1 tablet by mouth 2 (two) times daily. 07/23/18   Kallie Locks, FNP  vitamin B-12 (CYANOCOBALAMIN) 1000 MCG tablet Take 1,000 mcg by mouth daily.    [provider]    Family History Family History  Problem Relation Age of Onset  . Hypertension Mother   . Arthritis Mother   . Fibromyalgia Mother   . Other Father        no health concern    Social History Social History   Tobacco Use  . Smoking status: Current Every Day Smoker  Packs/day: 0.50    Types: Cigarettes  . Smokeless tobacco: Never Used  Substance Use Topics  . Alcohol use: Yes    Comment: occasionally  . Drug use: No     Allergies   Patient has no known allergies.   Review of Systems Review of Systems  Skin: Positive for rash.  Ten systems reviewed and are negative for acute change, except as noted in the HPI.    Physical Exam Updated Vital Signs BP (!) 141/90 (BP Location: Left Arm)   Pulse 89   Temp (!) 97.5 F (36.4 C) (Oral)   Resp 18   SpO2 100%   Physical Exam Vitals signs and nursing note reviewed.  Constitutional:      General: She is not in acute distress.    Appearance: She is  well-developed. She is not diaphoretic.     Comments: Nontoxic appearing; intermittent scratching arms.  HENT:     Head: Normocephalic and atraumatic.     Mouth/Throat:     Comments: No angioedema. Tolerating secretions. Eyes:     General: No scleral icterus.    Conjunctiva/sclera: Conjunctivae normal.  Neck:     Musculoskeletal: Normal range of motion.  Pulmonary:     Effort: Pulmonary effort is normal. No respiratory distress.     Comments: Respirations even and unlabored Musculoskeletal: Normal range of motion.  Skin:    General: Skin is warm and dry.     Coloration: Skin is not pale.     Findings: Rash present.     Comments: Mildly erythematous, pruritic, maculopapular dry appear rash to chest, abdomen, back. Also on proximal lower extremities, upper arms and volar forearms bilaterally. Larger macule noted under left breast; question herald patch.  Neurological:     Mental Status: She is alert and oriented to person, place, and time.  Psychiatric:        Behavior: Behavior normal.      ED Treatments / Results  Labs (all labs ordered are listed, but only abnormal results are displayed) Labs Reviewed - No data to display  EKG None  Radiology No results found.  Procedures Procedures (including critical care time)  Medications Ordered in ED Medications  predniSONE (DELTASONE) tablet 60 mg (has no administration in time range)  loratadine (CLARITIN) tablet 10 mg (has no administration in time range)     Initial Impression / Assessment and Plan / ED Course  I have reviewed the triage vital signs and the nursing notes.  Pertinent labs & imaging results that were available during my care of the patient were reviewed by me and considered in my medical decision making (see chart for details).        33 year old female presenting for a rash x 3 weeks consistent with pityriasis rosea.  Discussed self-limiting nature of symptoms. Will continue on supportive care with  prednisone taper, daily Zyrtec.  Low suspicion for scabies as rash does not include her interdigital spaces and is not specifically focused to her skin folds, axilla, groin.  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 Rx for Permethrin given for conservative therapy and to broaden coverage, but did discuss expectations that rash will likely not resolve with this topical medication if used.  She has been encouraged to follow-up with a dermatologist.  Return precautions discussed and provided. Patient discharged in stable condition with no unaddressed concerns.   Final Clinical Impressions(s) / ED Diagnoses   Final diagnoses:  Pityriasis rosea  ED Discharge Orders         Ordered    predniSONE (DELTASONE) 20 MG tablet  Daily     12/26/18 2358    cetirizine (ZYRTEC) 10 MG tablet  Daily     12/26/18 2358    permethrin (ELIMITE) 5 % cream     12/26/18 2358           Antony Madura, PA-C 12/27/18 0009    Gilda Crease, MD 12/27/18 [phone]

## 2018-12-30 ENCOUNTER — Encounter: Payer: Self-pay | Admitting: Family Medicine

## 2018-12-30 ENCOUNTER — Ambulatory Visit (INDEPENDENT_AMBULATORY_CARE_PROVIDER_SITE_OTHER): Payer: Self-pay | Admitting: Family Medicine

## 2018-12-30 VITALS — BP 140/88 | HR 88 | Temp 98.2°F | Ht 65.75 in | Wt 273.0 lb

## 2018-12-30 DIAGNOSIS — L299 Pruritus, unspecified: Secondary | ICD-10-CM | POA: Insufficient documentation

## 2018-12-30 DIAGNOSIS — Z09 Encounter for follow-up examination after completed treatment for conditions other than malignant neoplasm: Secondary | ICD-10-CM

## 2018-12-30 DIAGNOSIS — E66813 Obesity, class 3: Secondary | ICD-10-CM

## 2018-12-30 DIAGNOSIS — Z6841 Body Mass Index (BMI) 40.0 and over, adult: Secondary | ICD-10-CM

## 2018-12-30 DIAGNOSIS — R21 Rash and other nonspecific skin eruption: Secondary | ICD-10-CM | POA: Insufficient documentation

## 2018-12-30 DIAGNOSIS — L42 Pityriasis rosea: Secondary | ICD-10-CM | POA: Insufficient documentation

## 2018-12-30 NOTE — Progress Notes (Signed)
[facility]  Hospital Follow Up--Established Patient  Subjective:  Patient ID: Ariana Rogers, female    DOB: 09/04/1986  Age: 33 y.o. MRN: [id number]  CC:  Chief Complaint  Patient presents with  . Follow-up    Pityriasis rosea     HPI Ariana Rogers is a 33 year old female who presents for Hospital Follow up today.   No past medical history on file.  Past Surgical History:  Procedure Laterality Date  . DENTAL SURGERY    . TUBAL LIGATION  2 years ago   Current Status: Since her last office visit, she is doing well with no complaints. She has c/o skin problems since 10/2018. She had been to ED several times for treatment, which she is currently using Nizoral Cream and completing a Prednisone Taper. She states that prednisone is helping in relieving itching. She denies chest pain, heart palpitations, cough and shortness of breath reported.  She denies fevers, chills, fatigue, recent infections, weight loss, and night sweats. She has not had any headaches, visual changes, dizziness, and falls. No reports of GI problems such as nausea, vomiting, diarrhea, and constipation. She has no reports of blood in stools, dysuria and hematuria. No depression or anxiety reported. She denies pain today.   Family History  Problem Relation Age of Onset  . Hypertension Mother   . Arthritis Mother   . Fibromyalgia Mother   . Other Father        no health concern    Social History   Socioeconomic History  . Marital status: Single    Spouse name: Not on file  . Number of children: Not on file  . Years of education: Not on file  . Highest education level: Not on file  Occupational History  . Not on file  Social Needs  . Financial resource strain: Not on file  . Food insecurity:    Worry: Not on file    Inability: Not on file  . Transportation needs:    Medical: Not on file    Non-medical: Not on file  Tobacco Use  . Smoking status: Current  Every Day Smoker    Packs/day: 0.50    Types: Cigarettes  . Smokeless tobacco: Never Used  Substance and Sexual Activity  . Alcohol use: Yes    Comment: occasionally  . Drug use: No  . Sexual activity: Not on file  Lifestyle  . Physical activity:    Days per week: Not on file    Minutes per session: Not on file  . Stress: Not on file  Relationships  . Social connections:    Talks on phone: Not on file    Gets together: Not on file    Attends religious service: Not on file    Active member of club or organization: Not on file    Attends meetings of clubs or organizations: Not on file    Relationship status: Not on file  . Intimate partner violence:    Fear of current or ex partner: Not on file    Emotionally abused: Not on file    Physically abused: Not on file    Forced sexual activity: Not on file  Other Topics Concern  . Not on file  Social History Narrative  . Not on file    Outpatient Medications Prior to Visit  Medication Sig Dispense Refill  . Calcium Carbonate-Vitamin D (CALCIUM 600+D HIGH POTENCY PO) Take 1 capsule by mouth daily.    Marland Kitchen  cetirizine (ZYRTEC) 10 MG tablet Take 1 tablet (10 mg total) by mouth daily. 30 tablet 0  . fluticasone (FLONASE) 50 MCG/ACT nasal spray Place 2 sprays into both nostrils daily. 16 g 6  . ketoconazole (NIZORAL) 2 % cream Apply 1 application topically daily. 15 g 0  . predniSONE (DELTASONE) 20 MG tablet Take 2 tablets (40 mg total) by mouth daily. Take 40 mg by mouth daily for 3 days, then 20mg  by mouth daily for 3 days, then 10mg  daily for 3 days 12 tablet 0  . vitamin B-12 (CYANOCOBALAMIN) 1000 MCG tablet Take 1,000 mcg by mouth daily.    . chlorhexidine (HIBICLENS) 4 % external liquid Apply topically daily as needed. Wash twice weekly in the shower (Patient not taking: Reported on 12/30/2018) 120 mL 0  . permethrin (ELIMITE) 5 % cream Apply to entire body other than face - let sit for 14 hours then wash off, may repeat in 1 week if  still having symptoms (Patient not taking: Reported on 12/30/2018) 60 g 1  . fluticasone (FLONASE SENSIMIST) 27.5 MCG/SPRAY nasal spray Place 1 spray into the nose daily.    . ranitidine (ZANTAC) 150 MG tablet Take 150 mg by mouth 2 (two) times daily.    Marland Kitchen sulfamethoxazole-trimethoprim (BACTRIM DS,SEPTRA DS) 800-160 MG tablet Take 1 tablet by mouth 2 (two) times daily. 14 tablet 0   No facility-administered medications prior to visit.     No Known Allergies  ROS Review of Systems  Constitutional: Negative.   HENT: Negative.   Eyes: Negative.   Respiratory: Negative.   Cardiovascular: Negative.   Gastrointestinal: Negative.   Endocrine: Negative.   Genitourinary: Negative.   Musculoskeletal: Negative.   Skin: Positive for rash (maculopalpular ).  Allergic/Immunologic: Negative.   Neurological: Negative.   Hematological: Negative.   Psychiatric/Behavioral: Negative.    Objective:    Physical Exam  Constitutional: She is oriented to person, place, and time. She appears well-developed and well-nourished.  HENT:  Head: Normocephalic and atraumatic.  Eyes: Conjunctivae are normal.  Neck: Normal range of motion. Neck supple.  Cardiovascular: Normal rate, regular rhythm, normal heart sounds and intact distal pulses.  Pulmonary/Chest: Effort normal and breath sounds normal.  Abdominal: Soft. Bowel sounds are normal.  Musculoskeletal: Normal range of motion.  Neurological: She is alert and oriented to person, place, and time. She has normal reflexes.  Skin: Skin is warm and dry. Rash noted.  Psychiatric: She has a normal mood and affect. Her behavior is normal. Judgment and thought content normal.   BP 140/88 (BP Location: Left Arm, Patient Position: Sitting, Cuff Size: Large)   Pulse 88   Temp 98.2 F (36.8 C) (Oral)   Ht 5' 5.75" (1.67 m)   Wt 273 lb (123.8 kg)   LMP 12/12/2018   SpO2 100%   BMI 44.40 kg/m  Wt Readings from Last 3 Encounters:  12/30/18 273 lb (123.8 kg)    11/04/18 260 lb (117.9 kg)  07/23/18 262 lb 6.4 oz (119 kg)     Health Maintenance Due  Topic Date Due  . HIV Screening  10/27/2000  . INFLUENZA VACCINE  05/30/2018    There are no preventive care reminders to display for this patient.  Lab Results  Component Value Date   TSH 0.978 01/15/2018   Lab Results  Component Value Date   WBC 12.1 (H) 01/15/2018   HGB 14.3 01/15/2018   HCT 43.1 01/15/2018   MCV 87 01/15/2018   PLT 244 01/15/2018  Lab Results  Component Value Date   NA 143 01/15/2018   K 4.3 01/15/2018   CO2 20 01/15/2018   GLUCOSE 91 01/15/2018   BUN 12 01/15/2018   CREATININE 0.88 01/15/2018   BILITOT 0.3 01/15/2018   ALKPHOS 55 01/15/2018   AST 14 01/15/2018   ALT 18 01/15/2018   PROT 6.4 01/15/2018   ALBUMIN 4.0 01/15/2018   CALCIUM 8.6 (L) 01/15/2018   ANIONGAP 7 05/27/2017   No results found for: CHOL No results found for: HDL No results found for: LDLCALC No results found for: TRIG No results found for: [facility] Lab Results  Component Value Date   HGBA1C 5.9 01/15/2018   Assessment & Plan:   1. Pityriasis rosea  2. Maculopapular rash, generalized Rash noted on arms, chest, back, and legs. She will continue Prednisone taper and Nizoral cream as needed.   3. Class 3 severe obesity due to excess calories without serious comorbidity with body mass index (BMI) of 40.0 to 44.9 in adult [facility]) Body mass index is 44.4 kg/m. Goal BMI  is <30. Encouraged efforts to reduce weight include engaging in physical activity as tolerated with goal of 150 minutes per week. Improve dietary choices and eat a meal regimen consistent with a Mediterranean or DASH diet. Reduce simple carbohydrates. Do not skip meals and eat healthy snacks throughout the day to avoid over-eating at dinner. Set a goal weight loss that is achievable for you.  4. Itching She will continue Diphenhydramine as needed for itching.   5. Follow up She will follow up in 3 months. She is  currently in th process of completing [facility] Financial Assistance application so that we may refer her to Dermatology.   No orders of the defined types were placed in this encounter.  No orders of the defined types were placed in this encounter.   Referral Orders  No referral(s) requested today   Raliegh Ip,  MSN, FNP-C [facility] [address] [place], [place] [address] [phone]   Problem List Items Addressed This Visit    None    Visit Diagnoses    Pityriasis rosea    -  Primary   Maculopapular rash, generalized       Class 3 severe obesity due to excess calories without serious comorbidity with body mass index (BMI) of 40.0 to 44.9 in adult [facility])       Follow up          No orders of the defined types were placed in this encounter.   Follow-up: Return in about 3 months (around 04/01/2019).    Kallie Locks, FNP

## 2018-12-30 NOTE — Patient Instructions (Addendum)
DASH Eating Plan DASH stands for "Dietary Approaches to Stop Hypertension." The DASH eating plan is a healthy eating plan that has been shown to reduce high blood pressure (hypertension). It may also reduce your risk for type 2 diabetes, heart disease, and stroke. The DASH eating plan may also help with weight loss. What are tips for following this plan?  General guidelines  Avoid eating more than 2,300 mg (milligrams) of salt (sodium) a day. If you have hypertension, you may need to reduce your sodium intake to 1,500 mg a day.  Limit alcohol intake to no more than 1 drink a day for nonpregnant women and 2 drinks a day for men. One drink equals 12 oz of beer, 5 oz of wine, or 1 oz of hard liquor.  Work with your health care provider to maintain a healthy body weight or to lose weight. Ask what an ideal weight is for you.  Get at least 30 minutes of exercise that causes your heart to beat faster (aerobic exercise) most days of the week. Activities may include walking, swimming, or biking.  Work with your health care provider or diet and nutrition specialist (dietitian) to adjust your eating plan to your individual calorie needs. Reading food labels   Check food labels for the amount of sodium per serving. Choose foods with less than 5 percent of the Daily Value of sodium. Generally, foods with less than 300 mg of sodium per serving fit into this eating plan.  To find whole grains, look for the word "whole" as the first word in the ingredient list. Shopping  Buy products labeled as "low-sodium" or "no salt added."  Buy fresh foods. Avoid canned foods and premade or frozen meals. Cooking  Avoid adding salt when cooking. Use salt-free seasonings or herbs instead of table salt or sea salt. Check with your health care provider or pharmacist before using salt substitutes.  Do not fry foods. Cook foods using healthy methods such as baking, boiling, grilling, and broiling instead.  Cook with  heart-healthy oils, such as olive, canola, soybean, or sunflower oil. Meal planning  Eat a balanced diet that includes: ? 5 or more servings of fruits and vegetables each day. At each meal, try to fill half of your plate with fruits and vegetables. ? Up to 6-8 servings of whole grains each day. ? Less than 6 oz of lean meat, poultry, or fish each day. A 3-oz serving of meat is about the same size as a deck of cards. One egg equals 1 oz. ? 2 servings of low-fat dairy each day. ? A serving of nuts, seeds, or beans 5 times each week. ? Heart-healthy fats. Healthy fats called Omega-3 fatty acids are found in foods such as flaxseeds and coldwater fish, like sardines, salmon, and mackerel.  Limit how much you eat of the following: ? Canned or prepackaged foods. ? Food that is high in trans fat, such as fried foods. ? Food that is high in saturated fat, such as fatty meat. ? Sweets, desserts, sugary drinks, and other foods with added sugar. ? Full-fat dairy products.  Do not salt foods before eating.  Try to eat at least 2 vegetarian meals each week.  Eat more home-cooked food and less restaurant, buffet, and fast food.  When eating at a restaurant, ask that your food be prepared with less salt or no salt, if possible. What foods are recommended? The items listed may not be a complete list. Talk with your dietitian about   what dietary choices are best for you. Grains Whole-grain or whole-wheat bread. Whole-grain or whole-wheat pasta. Brown rice. Oatmeal. Quinoa. Bulgur. Whole-grain and low-sodium cereals. Pita bread. Low-fat, low-sodium crackers. Whole-wheat flour tortillas. Vegetables Fresh or frozen vegetables (raw, steamed, roasted, or grilled). Low-sodium or reduced-sodium tomato and vegetable juice. Low-sodium or reduced-sodium tomato sauce and tomato paste. Low-sodium or reduced-sodium canned vegetables. Fruits All fresh, dried, or frozen fruit. Canned fruit in natural juice (without  added sugar). Meat and other protein foods Skinless chicken or turkey. Ground chicken or turkey. Pork with fat trimmed off. Fish and seafood. Egg whites. Dried beans, peas, or lentils. Unsalted nuts, nut butters, and seeds. Unsalted canned beans. Lean cuts of beef with fat trimmed off. Low-sodium, lean deli meat. Dairy Low-fat (1%) or fat-free (skim) milk. Fat-free, low-fat, or reduced-fat cheeses. Nonfat, low-sodium ricotta or cottage cheese. Low-fat or nonfat yogurt. Low-fat, low-sodium cheese. Fats and oils Soft margarine without trans fats. Vegetable oil. Low-fat, reduced-fat, or light mayonnaise and salad dressings (reduced-sodium). Canola, safflower, olive, soybean, and sunflower oils. Avocado. Seasoning and other foods Herbs. Spices. Seasoning mixes without salt. Unsalted popcorn and pretzels. Fat-free sweets. What foods are not recommended? The items listed may not be a complete list. Talk with your dietitian about what dietary choices are best for you. Grains Baked goods made with fat, such as croissants, muffins, or some breads. Dry pasta or rice meal packs. Vegetables Creamed or fried vegetables. Vegetables in a cheese sauce. Regular canned vegetables (not low-sodium or reduced-sodium). Regular canned tomato sauce and paste (not low-sodium or reduced-sodium). Regular tomato and vegetable juice (not low-sodium or reduced-sodium). Pickles. Olives. Fruits Canned fruit in a light or heavy syrup. Fried fruit. Fruit in cream or butter sauce. Meat and other protein foods Fatty cuts of meat. Ribs. Fried meat. Bacon. Sausage. Bologna and other processed lunch meats. Salami. Fatback. Hotdogs. Bratwurst. Salted nuts and seeds. Canned beans with added salt. Canned or smoked fish. Whole eggs or egg yolks. Chicken or turkey with skin. Dairy Whole or 2% milk, cream, and half-and-half. Whole or full-fat cream cheese. Whole-fat or sweetened yogurt. Full-fat cheese. Nondairy creamers. Whipped toppings.  Processed cheese and cheese spreads. Fats and oils Butter. Stick margarine. Lard. Shortening. Ghee. Bacon fat. Tropical oils, such as coconut, palm kernel, or palm oil. Seasoning and other foods Salted popcorn and pretzels. Onion salt, garlic salt, seasoned salt, table salt, and sea salt. Worcestershire sauce. Tartar sauce. Barbecue sauce. Teriyaki sauce. Soy sauce, including reduced-sodium. Steak sauce. Canned and packaged gravies. Fish sauce. Oyster sauce. Cocktail sauce. Horseradish that you find on the shelf. Ketchup. Mustard. Meat flavorings and tenderizers. Bouillon cubes. Hot sauce and Tabasco sauce. Premade or packaged marinades. Premade or packaged taco seasonings. Relishes. Regular salad dressings. Where to find more information:  National Heart, Lung, and Blood Institute: www.nhlbi.nih.gov  American Heart Association: www.heart.org Summary  The DASH eating plan is a healthy eating plan that has been shown to reduce high blood pressure (hypertension). It may also reduce your risk for type 2 diabetes, heart disease, and stroke.  With the DASH eating plan, you should limit salt (sodium) intake to 2,300 mg a day. If you have hypertension, you may need to reduce your sodium intake to 1,500 mg a day.  When on the DASH eating plan, aim to eat more fresh fruits and vegetables, whole grains, lean proteins, low-fat dairy, and heart-healthy fats.  Work with your health care provider or diet and nutrition specialist (dietitian) to adjust your eating plan to your   individual calorie needs. This information is not intended to replace advice given to you by your health care provider. Make sure you discuss any questions you have with your health care provider. Document Released: 10/05/2011 Document Revised: 10/09/2016 Document Reviewed: 10/09/2016 Elsevier Interactive Patient Education  2019 [employer]  Pityriasis Rosea Pityriasis rosea is a rash that usually appears on the chest, abdomen, and  back. It may also appear on the upper arms and upper legs. It usually begins as a single patch, and then more patches start to develop. The rash may cause mild itching, but it normally does not cause other problems. It usually goes away without treatment. However, it may take weeks or months for the rash to go away completely. What are the causes? The cause of this condition is not known. The condition does not spread from person to person (is not contagious). What increases the risk? This condition is more likely to develop in:  Persons aged 10-35 years.  Pregnant women. It is more common in the spring and fall seasons. What are the signs or symptoms? The main symptom of this condition is a rash.  The rash usually begins with a single oval patch that is larger than the ones that follow. This is called a herald patch. It generally appears a week or more before the rest of the rash appears.  When more patches start to develop, they spread quickly on the chest, abdomen, back, arms, and legs. These patches are smaller than the first one.  The patches that make up the rash are usually oval-shaped and pink or red in color. They are usually flat but may sometimes be raised so that they can be felt with a finger. They may also be finely crinkled and have a scaly ring around the edge. Some people may have mild itching and nonspecific symptoms, such as:  Nausea.  Loss of appetite.  Difficulty concentrating.  Headache.  Irritability.  Sore throat.  Mild fever. How is this diagnosed? This condition may be diagnosed based on:  Your medical history and a physical exam.  Tests to rule out other causes. This may include blood tests or a test in which a small sample of skin is removed from the rash (biopsy) and checked in a lab. How is this treated?     Treatment is not usually needed for this condition. The rash will often go away on its own in 4-8 weeks. In some cases, a health care  provider may recommend or prescribe medicine to reduce itching. Follow these instructions at home:  Take or apply over-the-counter and prescription medicines only as told by your health care provider.  Avoid scratching the affected areas of skin.  Do not take hot baths or use a sauna. Use only warm water when bathing or showering. Heat can increase itching. Adding cornstarch to your bath may help to relieve the itching.  Avoid exposure to the sun and other sources of UV light, such as tanning beds, as told by your health care provider. UV light may help the rash go away but may cause unwanted changes in skin color.  Keep all follow-up visits as told by your health care provider. This is important. Contact a health care provider if:  Your rash does not go away in 8 weeks.  Your rash gets much worse.  You have a fever.  You have swelling or pain in the rash area.  You have fluid, blood, or pus coming from the rash area. Summary  Pityriasis rosea is a rash that usually appears on the trunk of the body. It can also appear on the upper arms and upper legs.  The rash usually begins with a single oval patch (herald patch) that appears a week or more before the rest of the rash appears. The herald patch is larger than the ones that follow.  The rash may cause mild itching, but it usually does not cause other problems. It usually goes away without treatment in 4-8 weeks.  In some cases, a health care provider may recommend or prescribe medicine to reduce itching. This information is not intended to replace advice given to you by your health care provider. Make sure you discuss any questions you have with your health care provider. Document Released: 11/22/2001 Document Revised: 10/15/2017 Document Reviewed: 10/15/2017 Elsevier Interactive Patient Education  2019 [employer] Diphenhydramine capsules or tablets What is this medicine? DIPHENHYDRAMINE (dye fen HYE dra meen) is an  antihistamine. It is used to treat the symptoms of an allergic reaction. It is also used to treat Parkinson's disease. This medicine is also used to prevent and to treat motion sickness and as a nighttime sleep aid. This medicine may be used for other purposes; ask your health care provider or pharmacist if you have questions. COMMON BRAND NAME(S): Alka-Seltzer Plus Allergy, Aller-G-Time, Banophen, Benadryl Allergy, Benadryl Allergy Dye Free, Benadryl Allergy Kapgel, Benadryl Allergy Ultratab, Diphedryl, Diphenhist, Genahist, Geri-Dryl, PHARBEDRYL, Q-Dryl, Veto Kemps, Valu-Dryl, Vicks ZzzQuil Nightime Sleep-Aid What should I tell my health care provider before I take this medicine? They need to know if you have any of these conditions: -asthma or lung disease -glaucoma -high blood pressure or heart disease -liver disease -pain or difficulty passing urine -prostate trouble -ulcers or other stomach problems -an unusual or allergic reaction to diphenhydramine, other medicines foods, dyes, or preservatives such as sulfites -pregnant or trying to get pregnant -breast-feeding How should I use this medicine? Take this medicine by mouth with a full glass of water. Follow the directions on the prescription label. Take your doses at regular intervals. Do not take your medicine more often than directed. To prevent motion sickness start taking this medicine 30 to 60 minutes before you leave. Talk to your pediatrician regarding the use of this medicine in children. Special care may be needed. Patients over 34 years old may have a stronger reaction and need a smaller dose. Overdosage: If you think you have taken too much of this medicine contact a poison control center or emergency room at once. NOTE: This medicine is only for you. Do not share this medicine with others. What if I miss a dose? If you miss a dose, take it as soon as you can. If it is almost time for your next dose, take only that dose. Do not  take double or extra doses. What may interact with this medicine? Do not take this medicine with any of the following medications: -MAOIs like Carbex, Eldepryl, Marplan, Nardil, and Parnate This medicine may also interact with the following medications: -alcohol -barbiturates, like phenobarbital -medicines for bladder spasm like oxybutynin, tolterodine -medicines for blood pressure -medicines for depression, anxiety, or psychotic disturbances -medicines for movement abnormalities or Parkinson's disease -medicines for sleep -other medicines for cold, cough or allergy -some medicines for the stomach like chlordiazepoxide, dicyclomine This list may not describe all possible interactions. Give your health care provider a list of all the medicines, herbs, non-prescription drugs, or dietary supplements you use. Also tell them if you smoke, drink  alcohol, or use illegal drugs. Some items may interact with your medicine. What should I watch for while using this medicine? Visit your doctor or health care professional for regular check ups. Tell your doctor if your symptoms do not improve or if they get worse. Your mouth may get dry. Chewing sugarless gum or sucking hard candy, and drinking plenty of water may help. Contact your doctor if the problem does not go away or is severe. This medicine may cause dry eyes and blurred vision. If you wear contact lenses you may feel some discomfort. Lubricating drops may help. See your eye doctor if the problem does not go away or is severe. You may get drowsy or dizzy. Do not drive, use machinery, or do anything that needs mental alertness until you know how this medicine affects you. Do not stand or sit up quickly, especially if you are an older patient. This reduces the risk of dizzy or fainting spells. Alcohol may interfere with the effect of this medicine. Avoid alcoholic drinks. What side effects may I notice from receiving this medicine? Side effects that you  should report to your doctor or health care professional as soon as possible: -allergic reactions like skin rash, itching or hives, swelling of the face, lips, or tongue -changes in vision -confused, agitated, nervous -irregular or fast heartbeat -tremor -trouble passing urine -unusual bleeding or bruising -unusually weak or tired Side effects that usually do not require medical attention (report to your doctor or health care professional if they continue or are bothersome): -constipation, diarrhea -drowsy -headache -loss of appetite -stomach upset, vomiting -thick mucous This list may not describe all possible side effects. Call your doctor for medical advice about side effects. You may report side effects to FDA at 1-800-FDA-1088. Where should I keep my medicine? Keep out of the reach of children. Store at room temperature between 15 and 30 degrees C (59 and 86 degrees F). Keep container closed tightly. Throw away any unused medicine after the expiration date. NOTE: This sheet is a summary. It may not cover all possible information. If you have questions about this medicine, talk to your doctor, pharmacist, or health care provider.  2019 Elsevier/Gold Standard (2008-02-03 17:06:22)

## 2019-01-28 ENCOUNTER — Encounter: Payer: Self-pay | Admitting: Family Medicine

## 2019-01-28 ENCOUNTER — Other Ambulatory Visit: Payer: Self-pay

## 2019-01-28 ENCOUNTER — Ambulatory Visit (INDEPENDENT_AMBULATORY_CARE_PROVIDER_SITE_OTHER): Payer: Self-pay | Admitting: Family Medicine

## 2019-01-28 ENCOUNTER — Other Ambulatory Visit: Payer: Self-pay | Admitting: Family Medicine

## 2019-01-28 VITALS — BP 134/88 | HR 90 | Temp 98.5°F | Ht 65.75 in | Wt 273.0 lb

## 2019-01-28 DIAGNOSIS — Z09 Encounter for follow-up examination after completed treatment for conditions other than malignant neoplasm: Secondary | ICD-10-CM

## 2019-01-28 DIAGNOSIS — R319 Hematuria, unspecified: Secondary | ICD-10-CM

## 2019-01-28 DIAGNOSIS — N644 Mastodynia: Secondary | ICD-10-CM | POA: Insufficient documentation

## 2019-01-28 DIAGNOSIS — N6320 Unspecified lump in the left breast, unspecified quadrant: Secondary | ICD-10-CM

## 2019-01-28 DIAGNOSIS — Z0000 Encounter for general adult medical examination without abnormal findings: Secondary | ICD-10-CM

## 2019-01-28 DIAGNOSIS — R8290 Unspecified abnormal findings in urine: Secondary | ICD-10-CM

## 2019-01-28 DIAGNOSIS — N390 Urinary tract infection, site not specified: Secondary | ICD-10-CM

## 2019-01-28 LAB — POCT URINALYSIS DIPSTICK
Bilirubin, UA: NEGATIVE
Glucose, UA: NEGATIVE
Ketones, UA: NEGATIVE
Nitrite, UA: NEGATIVE
Protein, UA: NEGATIVE
Spec Grav, UA: 1.025 (ref 1.010–1.025)
Urobilinogen, UA: 1 E.U./dL
pH, UA: 6 (ref 5.0–8.0)

## 2019-01-28 MED ORDER — SULFAMETHOXAZOLE-TRIMETHOPRIM 800-160 MG PO TABS: 1.0000 | ORAL_TABLET | Freq: Two times a day (BID) | ORAL | 0 refills | Status: AC

## 2019-01-28 NOTE — Patient Instructions (Signed)
Sulfamethoxazole; Trimethoprim, SMX-TMP tablets What is this medicine? SULFAMETHOXAZOLE; TRIMETHOPRIM or SMX-TMP (suhl fuh meth OK suh zohl; trye METH oh prim) is a combination of a sulfonamide antibiotic and a second antibiotic, trimethoprim. It is used to treat or prevent certain kinds of bacterial infections. It will not work for colds, flu, or other viral infections. This medicine may be used for other purposes; ask your health care provider or pharmacist if you have questions. COMMON BRAND NAME(S): Bacter-Aid DS, Bactrim, Bactrim DS, Septra, Septra DS What should I tell my health care provider before I take this medicine? They need to know if you have any of these conditions: -anemia -asthma -being treated with anticonvulsants -if you frequently drink alcohol containing drinks -kidney disease -liver disease -low level of folic acid or glucose-6-phosphate dehydrogenase -poor nutrition or malabsorption -porphyria -severe allergies -thyroid disorder -an unusual or allergic reaction to sulfamethoxazole, trimethoprim, sulfa drugs, other medicines, foods, dyes, or preservatives -pregnant or trying to get pregnant -breast-feeding How should I use this medicine? Take this medicine by mouth with a full glass of water. Follow the directions on the prescription label. Take your medicine at regular intervals. Do not take it more often than directed. Do not skip doses or stop your medicine early. Talk to your pediatrician regarding the use of this medicine in children. Special care may be needed. This medicine has been used in children as young as 2 months of age. Overdosage: If you think you have taken too much of this medicine contact a poison control center or emergency room at once. NOTE: This medicine is only for you. Do not share this medicine with others. What if I miss a dose? If you miss a dose, take it as soon as you can. If it is almost time for your next dose, take only that dose. Do  not take double or extra doses. What may interact with this medicine? Do not take this medicine with any of the following medications: -aminobenzoate potassium -dofetilide -metronidazole This medicine may also interact with the following medications: -ACE inhibitors like benazepril, enalapril, lisinopril, and ramipril -birth control pills -cyclosporine -digoxin -diuretics -indomethacin -medicines for diabetes -methenamine -methotrexate -phenytoin -potassium supplements -pyrimethamine -sulfinpyrazone -tricyclic antidepressants -warfarin This list may not describe all possible interactions. Give your health care provider a list of all the medicines, herbs, non-prescription drugs, or dietary supplements you use. Also tell them if you smoke, drink alcohol, or use illegal drugs. Some items may interact with your medicine. What should I watch for while using this medicine? Tell your doctor or health care professional if your symptoms do not improve. Drink several glasses of water a day to reduce the risk of kidney problems. Do not treat diarrhea with over the counter products. Contact your doctor if you have diarrhea that lasts more than 2 days or if it is severe and watery. This medicine can make you more sensitive to the sun. Keep out of the sun. If you cannot avoid being in the sun, wear protective clothing and use a sunscreen. Do not use sun lamps or tanning beds/booths. What side effects may I notice from receiving this medicine? Side effects that you should report to your doctor or health care professional as soon as possible: -allergic reactions like skin rash or hives, swelling of the face, lips, or tongue -breathing problems -fever or chills, sore throat -irregular heartbeat, chest pain -joint or muscle pain -pain or difficulty passing urine -red pinpoint spots on skin -redness, blistering, peeling or loosening of   the skin, including inside the mouth -unusual bleeding or  bruising -unusually weak or tired -yellowing of the eyes or skin Side effects that usually do not require medical attention (report to your doctor or health care professional if they continue or are bothersome): -diarrhea -dizziness -headache -loss of appetite -nausea, vomiting -nervousness This list may not describe all possible side effects. Call your doctor for medical advice about side effects. You may report side effects to FDA at 1-800-FDA-1088. Where should I keep my medicine? Keep out of the reach of children. Store at room temperature between 20 to 25 degrees C (68 to 77 degrees F). Protect from light. Throw away any unused medicine after the expiration date. NOTE: This sheet is a summary. It may not cover all possible information. If you have questions about this medicine, talk to your doctor, pharmacist, or health care provider.  2019 Elsevier/Gold Standard (2013-05-23 14:38:26) Urinary Tract Infection, Adult A urinary tract infection (UTI) is an infection of any part of the urinary tract. The urinary tract includes:  The kidneys.  The ureters.  The bladder.  The urethra. These organs make, store, and get rid of pee (urine) in the body. What are the causes? This is caused by germs (bacteria) in your genital area. These germs grow and cause swelling (inflammation) of your urinary tract. What increases the risk? You are more likely to develop this condition if:  You have a small, thin tube (catheter) to drain pee.  You cannot control when you pee or poop (incontinence).  You are female, and: ? You use these methods to prevent pregnancy: ? A medicine that kills sperm (spermicide). ? A device that blocks sperm (diaphragm). ? You have low levels of a female hormone (estrogen). ? You are pregnant.  You have genes that add to your risk.  You are sexually active.  You take antibiotic medicines.  You have trouble peeing because of: ? A prostate that is bigger than  normal, if you are female. ? A blockage in the part of your body that drains pee from the bladder (urethra). ? A kidney stone. ? A nerve condition that affects your bladder (neurogenic bladder). ? Not getting enough to drink. ? Not peeing often enough.  You have other conditions, such as: ? Diabetes. ? A weak disease-fighting system (immune system). ? Sickle cell disease. ? Gout. ? Injury of the spine. What are the signs or symptoms? Symptoms of this condition include:  Needing to pee right away (urgently).  Peeing often.  Peeing small amounts often.  Pain or burning when peeing.  Blood in the pee.  Pee that smells bad or not like normal.  Trouble peeing.  Pee that is cloudy.  Fluid coming from the vagina, if you are female.  Pain in the belly or lower back. Other symptoms include:  Throwing up (vomiting).  No urge to eat.  Feeling mixed up (confused).  Being tired and grouchy (irritable).  A fever.  Watery poop (diarrhea). How is this treated? This condition may be treated with:  Antibiotic medicine.  Other medicines.  Drinking enough water. Follow these instructions at home:  Medicines  Take over-the-counter and prescription medicines only as told by your doctor.  If you were prescribed an antibiotic medicine, take it as told by your doctor. Do not stop taking it even if you start to feel better. General instructions  Make sure you: ? Pee until your bladder is empty. ? Do not hold pee for a long time. ?   Empty your bladder after sex. ? Wipe from front to back after pooping if you are a female. Use each tissue one time when you wipe.  Drink enough fluid to keep your pee pale yellow.  Keep all follow-up visits as told by your doctor. This is important. Contact a doctor if:  You do not get better after 1-2 days.  Your symptoms go away and then come back. Get help right away if:  You have very bad back pain.  You have very bad pain in  your lower belly.  You have a fever.  You are sick to your stomach (nauseous).  You are throwing up. Summary  A urinary tract infection (UTI) is an infection of any part of the urinary tract.  This condition is caused by germs in your genital area.  There are many risk factors for a UTI. These include having a small, thin tube to drain pee and not being able to control when you pee or poop.  Treatment includes antibiotic medicines for germs.  Drink enough fluid to keep your pee pale yellow. This information is not intended to replace advice given to you by your health care provider. Make sure you discuss any questions you have with your health care provider. Document Released: 04/03/2008 Document Revised: 04/25/2018 Document Reviewed: 04/25/2018 Elsevier Interactive Patient Education  2019 Elsevier Inc.  

## 2019-01-28 NOTE — Progress Notes (Signed)
[facility]  Sick Visit  Subjective:  Patient ID: Ariana Rogers, female    DOB: 08-Aug-1986  Age: 33 y.o. MRN: [id number]  CC:  Chief Complaint  Patient presents with  . Breast Pain    Left   HPI Ariana Rogers is a 33 year old female who presents for Sick Visit today.   History reviewed. No pertinent past medical history.  Past Surgical History:  Procedure Laterality Date  . DENTAL SURGERY    . TUBAL LIGATION  2 years ago   Current Status: Since her last office visit, she has been doing well. For the past few weeks, she has noticed a breast 'lump' in her left breast. Today she has c/o left breast pain/discomfort. She denies trauma to her left breast. She has not taken any medication for the pain. She has never had a Mammogram and denies any family history of breast cancer. She denies fevers, chills, fatigue, recent infections, weight loss, and night sweats. She has not had any headaches, visual changes, dizziness, and falls. No chest pain, heart palpitations, cough and shortness of breath reported. No reports of GI problems such as nausea, vomiting, diarrhea, and constipation. She has no reports of blood in stools, dysuria and hematuria. No depression or anxiety reported.   Family History  Problem Relation Age of Onset  . Hypertension Mother   . Arthritis Mother   . Fibromyalgia Mother   . Other Father        no health concern    Social History   Socioeconomic History  . Marital status: Single    Spouse name: Not on file  . Number of children: Not on file  . Years of education: Not on file  . Highest education level: Not on file  Occupational History  . Not on file  Social Needs  . Financial resource strain: Not on file  . Food insecurity:    Worry: Not on file    Inability: Not on file  . Transportation needs:    Medical: Not on file    Non-medical: Not on file  Tobacco Use  . Smoking status: Current Every Day  Smoker    Packs/day: 0.50    Types: Cigarettes  . Smokeless tobacco: Never Used  Substance and Sexual Activity  . Alcohol use: Yes    Comment: occasionally  . Drug use: No  . Sexual activity: Not on file  Lifestyle  . Physical activity:    Days per week: Not on file    Minutes per session: Not on file  . Stress: Not on file  Relationships  . Social connections:    Talks on phone: Not on file    Gets together: Not on file    Attends religious service: Not on file    Active member of club or organization: Not on file    Attends meetings of clubs or organizations: Not on file    Relationship status: Not on file  . Intimate partner violence:    Fear of current or ex partner: Not on file    Emotionally abused: Not on file    Physically abused: Not on file    Forced sexual activity: Not on file  Other Topics Concern  . Not on file  Social History Narrative  . Not on file    Outpatient Medications Prior to Visit  Medication Sig Dispense Refill  . Calcium Carbonate-Vitamin D (CALCIUM 600+D HIGH POTENCY PO) Take 1  capsule by mouth daily.    . cetirizine (ZYRTEC) 10 MG tablet Take 1 tablet (10 mg total) by mouth daily. 30 tablet 0  . fluticasone (FLONASE) 50 MCG/ACT nasal spray Place 2 sprays into both nostrils daily. 16 g 6  . vitamin B-12 (CYANOCOBALAMIN) 1000 MCG tablet Take 1,000 mcg by mouth daily.    . predniSONE (DELTASONE) 20 MG tablet Take 2 tablets (40 mg total) by mouth daily. Take 40 mg by mouth daily for 3 days, then  by mouth daily for 3 days, then  daily for 3 days 12 tablet 0  . chlorhexidine (HIBICLENS) 4 % external liquid Apply topically daily as needed. Wash twice weekly in the shower (Patient not taking: Reported on 01/28/2019) 120 mL 0  . ketoconazole (NIZORAL) 2 % cream Apply 1 application topically daily. (Patient not taking: Reported on 01/28/2019) 15 g 0  . permethrin (ELIMITE) 5 % cream Apply to entire body other than face - let sit for 14 hours then  wash off, may repeat in 1 week if still having symptoms (Patient not taking: Reported on 01/28/2019) 60 g 1   No facility-administered medications prior to visit.     No Known Allergies  ROS Review of Systems  Constitutional: Negative.   HENT: Negative.   Eyes: Negative.   Respiratory: Negative.   Cardiovascular: Negative.   Gastrointestinal: Negative.  Abdominal distention: obese.  Endocrine: Negative.   Genitourinary: Negative.   Musculoskeletal: Negative.   Skin: Positive for color change.       Right breast healed sore; right upper outer quadrant of breast noted @ 9 O'clock   Allergic/Immunologic: Negative.   Neurological: Negative.   Hematological: Negative.   Psychiatric/Behavioral: Negative.    Objective:    Physical Exam  Constitutional: She is oriented to person, place, and time. She appears well-developed and well-nourished.  HENT:  Head: Normocephalic and atraumatic.  Eyes: Conjunctivae are normal.  Neck: Normal range of motion. Neck supple.  Cardiovascular: Normal rate, regular rhythm, normal heart sounds and intact distal pulses.  Pulmonary/Chest: Effort normal and breath sounds normal.    Abdominal: Soft. Bowel sounds are normal.  Musculoskeletal: Normal range of motion.  Neurological: She is alert and oriented to person, place, and time. She has normal reflexes.  Skin: Skin is warm and dry.  Psychiatric: She has a normal mood and affect. Her behavior is normal. Judgment and thought content normal.  Nursing note and vitals reviewed.   BP 134/88 (BP Location: Left Arm, Patient Position: Sitting, Cuff Size: Large)   Pulse 90   Temp 98.5 F (36.9 C) (Oral)   Ht 5' 5.75" (1.67 m)   Wt 273 lb (123.8 kg)   LMP 01/12/2019   SpO2 99%   BMI 44.40 kg/m  Wt Readings from Last 3 Encounters:  01/28/19 273 lb (123.8 kg)  12/30/18 273 lb (123.8 kg)  11/04/18 260 lb (117.9 kg)    Health Maintenance Due  Topic Date Due  . HIV Screening  10/27/2000  .  INFLUENZA VACCINE  05/30/2018    There are no preventive care reminders to display for this patient.  Lab Results  Component Value Date   TSH 0.978 01/15/2018   Lab Results  Component Value Date   WBC 12.1 (H) 01/15/2018   HGB 14.3 01/15/2018   HCT 43.1 01/15/2018   MCV 87 01/15/2018   PLT 244 01/15/2018   Lab Results  Component Value Date   NA 143 01/15/2018   K 4.3 01/15/2018  CO2 20 01/15/2018   GLUCOSE 91 01/15/2018   BUN 12 01/15/2018   CREATININE 0.88 01/15/2018   BILITOT 0.3 01/15/2018   ALKPHOS 55 01/15/2018   AST 14 01/15/2018   ALT 18 01/15/2018   PROT 6.4 01/15/2018   ALBUMIN 4.0 01/15/2018   CALCIUM 8.6 (L) 01/15/2018   ANIONGAP 7 05/27/2017   No results found for: CHOL No results found for: HDL No results found for: LDLCALC No results found for: TRIG No results found for: [facility] Lab Results  Component Value Date   HGBA1C 5.9 01/15/2018    Assessment & Plan:   1. Breast mass, left - MM Digital Diagnostic Bilat; Future  2. Painful lumpy left breast - MM Digital Diagnostic Bilat; Future  3. Healthcare maintenance Urine pregnancy test is negative today.  - POCT urine pregnancy - Urinalysis Dipstick  4. Urinary tract infection with hematuria, site unspecified We will initiate Septra today. - sulfamethoxazole-trimethoprim (BACTRIM DS,SEPTRA DS) 800-160 MG tablet; Take 1 tablet by mouth 2 (two) times daily for 7 days.  Dispense: 14 tablet; Refill: 0  5. Abnormal urinalysis Results are pending.  - Urine Culture  6. Follow up She will keep follow up appointment.   Meds ordered this encounter  Medications  . sulfamethoxazole-trimethoprim (BACTRIM DS,SEPTRA DS) 800-160 MG tablet    Sig: Take 1 tablet by mouth 2 (two) times daily for 7 days.    Dispense:  14 tablet    Refill:  0    Orders Placed This Encounter  Procedures  . Urine Culture  . MM Digital Diagnostic Bilat  . POCT urine pregnancy  . Urinalysis Dipstick    Referral  Orders  No referral(s) requested today    Raliegh Ip,  MSN, FNP-C [facility] [address] [place], [place] [address] [phone]    Problem List Items Addressed This Visit    None    Visit Diagnoses    Breast mass, left    -  Primary   Relevant Orders   MM Digital Diagnostic Bilat   Painful lumpy left breast       Relevant Orders   MM Digital Diagnostic Bilat   Healthcare maintenance       Relevant Orders   POCT urine pregnancy   Urinalysis Dipstick (Completed)   Follow up       Abnormal urinalysis       Relevant Orders   Urine Culture   Urinary tract infection with hematuria, site unspecified       Relevant Medications   sulfamethoxazole-trimethoprim (BACTRIM DS,SEPTRA DS) 800-160 MG tablet      Meds ordered this encounter  Medications  . sulfamethoxazole-trimethoprim (BACTRIM DS,SEPTRA DS) 800-160 MG tablet    Sig: Take 1 tablet by mouth 2 (two) times daily for 7 days.    Dispense:  14 tablet    Refill:  0    Follow-up: No follow-ups on file.    Kallie Locks, FNP

## 2019-01-29 ENCOUNTER — Telehealth: Payer: Self-pay

## 2019-01-29 ENCOUNTER — Other Ambulatory Visit (HOSPITAL_COMMUNITY): Payer: Self-pay | Admitting: *Deleted

## 2019-01-29 DIAGNOSIS — N6320 Unspecified lump in the left breast, unspecified quadrant: Secondary | ICD-10-CM

## 2019-01-29 LAB — POCT URINE PREGNANCY: Preg Test, Ur: NEGATIVE

## 2019-01-29 NOTE — Telephone Encounter (Signed)
-----   Message from Kallie Locks, FNP sent at 01/28/2019  6:56 PM EDT ----- Regarding: "Urine Pregnancy" Results of Urine Pregnancy Test were negative. I am unable to close chart. Please add results. Thank you.

## 2019-01-29 NOTE — Telephone Encounter (Signed)
Results added

## 2019-01-30 LAB — URINE CULTURE

## 2019-02-10 ENCOUNTER — Telehealth (HOSPITAL_COMMUNITY): Payer: Self-pay | Admitting: *Deleted

## 2019-02-10 NOTE — Telephone Encounter (Signed)
Telephoned patient at home number to confirm [facility] appointment. Advised patient would need to speak to her prior to appointment otherwise would have to cancel appointment.

## 2019-02-10 NOTE — Telephone Encounter (Signed)
Patient returned call to [facility]. Verified appointment for April 14. No symptoms of COVID-19. No contact with someone with a confirmed diagnosis of COVID-19. No travel outside of  in past 14 days.

## 2019-02-11 ENCOUNTER — Other Ambulatory Visit: Payer: Self-pay

## 2019-02-11 ENCOUNTER — Ambulatory Visit (HOSPITAL_COMMUNITY)
Admission: RE | Admit: 2019-02-11 | Discharge: 2019-02-11 | Disposition: A | Discharge status: Home / Self Care | Payer: Self-pay | Source: Ambulatory Visit | Attending: Obstetrics and Gynecology | Admitting: Obstetrics and Gynecology

## 2019-02-11 ENCOUNTER — Encounter (HOSPITAL_COMMUNITY): Payer: Self-pay

## 2019-02-11 VITALS — BP 116/72 | Temp 97.9°F | Ht 65.75 in | Wt 272.0 lb

## 2019-02-11 DIAGNOSIS — Z1239 Encounter for other screening for malignant neoplasm of breast: Secondary | ICD-10-CM

## 2019-02-11 DIAGNOSIS — N6322 Unspecified lump in the left breast, upper inner quadrant: Secondary | ICD-10-CM

## 2019-02-11 DIAGNOSIS — N644 Mastodynia: Secondary | ICD-10-CM

## 2019-02-11 NOTE — Patient Instructions (Signed)
Explained breast self awareness with Ariana Rogers. Patient did not need a Pap smear today due to last Pap smear was 07/23/2018. Let her know [facility] will cover Pap smears every 3 years unless has a history of abnormal Pap smears. Referred patient to the [facility] for a diagnostic mammogram and left breast ultrasound. Appointment scheduled for Thursday, February 13, 2019 at 0840. Patient aware of appointment and will be there. Discussed smoking cessation with patient. Referred to the [facility] Quitline and gave resources to the free smoking cessation classes at [facility]. Ariana Rogers verbalized understanding.  Ariana Rogers, Ariana Maser, RN 9:14 AM

## 2019-02-11 NOTE — Progress Notes (Signed)
Complaints of a left breast lump x 3 weeks that was painful at first.  Pap Smear: Pap smear not completed today. Last Pap smear was 07/23/2018 at the [facility] and normal. Per patient has a history of an abnormal Pap smear 15 years ago that a colposcopy was completed for follow-up. Per patient all Pap smears have been normal since colposcopy and has had more than three normal Pap smears. Last Pap smear result is in Epic.  Physical exam: Breasts Breasts symmetrical. No skin abnormalities left breast. Observed a healed scar on the right breast at 9 o'clock 10 cm from the nipple that per patient was a questionable infected boil. No nipple retraction bilateral breasts. No nipple discharge bilateral breasts. No lymphadenopathy. No lumps palpated right breast. Palpated a lump within the left breast at 10 o'clock 15 cm from the nipple. Complaints of pain when palpated lump within the left breast. Referred patient to the [facility] for a diagnostic mammogram and left breast ultrasound. Appointment scheduled for Thursday, February 13, 2019 at 0840.        Pelvic/Bimanual No Pap smear completed today since last Pap smear was 07/23/2018. Pap smear not indicated per [facility] guidelines.   Smoking History: Patient is a current smoker. Discussed smoking cessation with patient. Referred to the [facility] Quitline and gave resources to the free smoking cessation classes at [facility].  Patient Navigation: Patient education provided. Access to services provided for patient through [facility] program.   Breast and Cervical Cancer Risk Assessment: Patient has no family history of breast cancer, known genetic mutations, or radiation treatment to the chest before age 69. Per patient has a history of cervical dysplasia. Patient has no history of being immunocompromised or DES exposure in-utero.  Risk Assessment    Risk Scores      02/11/2019   Last edited by: Lynnell Dike, LPN   5-year  risk:    Lifetime risk:

## 2019-02-13 ENCOUNTER — Ambulatory Visit
Admission: RE | Admit: 2019-02-13 | Discharge: 2019-02-13 | Disposition: A | Discharge status: Home / Self Care | Payer: No Typology Code available for payment source | Source: Ambulatory Visit | Attending: Obstetrics and Gynecology | Admitting: Obstetrics and Gynecology

## 2019-02-13 ENCOUNTER — Other Ambulatory Visit: Payer: Self-pay

## 2019-02-13 ENCOUNTER — Other Ambulatory Visit (HOSPITAL_COMMUNITY): Payer: Self-pay | Admitting: Obstetrics and Gynecology

## 2019-02-13 DIAGNOSIS — N6320 Unspecified lump in the left breast, unspecified quadrant: Secondary | ICD-10-CM

## 2019-02-13 IMAGING — MG DIGITAL DIAGNOSTIC BILATERAL MAMMOGRAM WITH TOMO AND CAD
8 of 14 series · 8 of 40 positions shown · non-contrast
Comparison: None

CLINICAL DATA: Patient presents for palpable abnormality within the
medial left breast.

EXAM:
DIGITAL DIAGNOSTIC BILATERAL MAMMOGRAM WITH CAD AND TOMO
ULTRASOUND LEFT BREAST

[L CC synth-2D (1 of 2)]
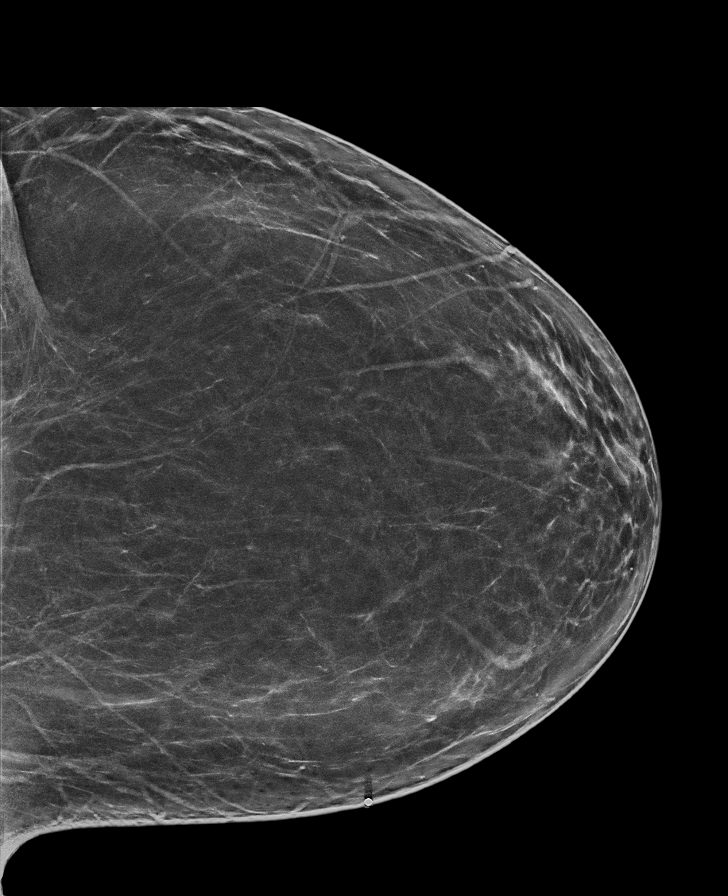

[R MLO synth-2D (1 of 2)]
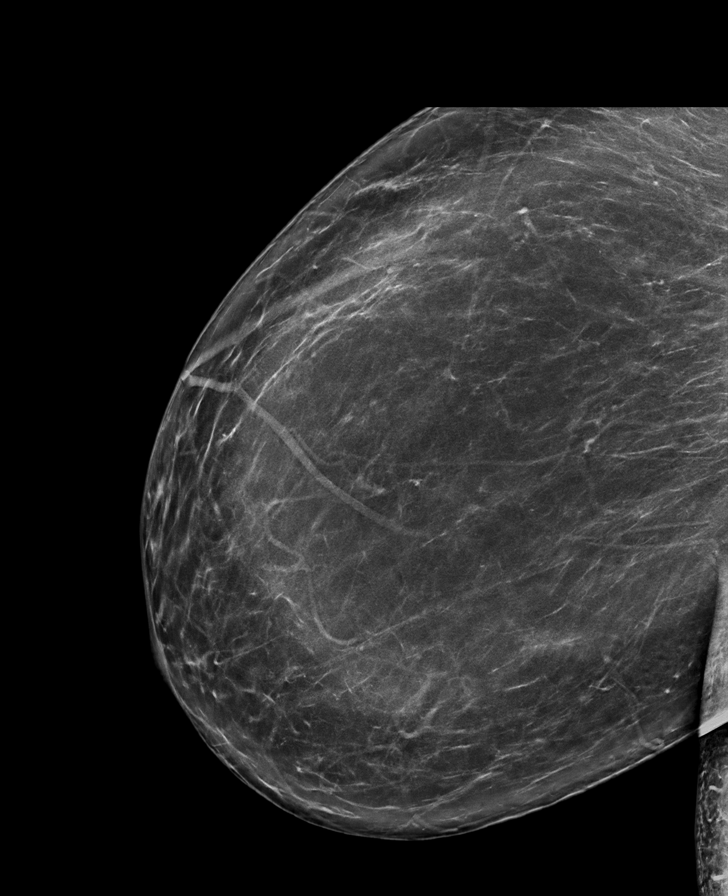

[L CC synth-2D (2 of 2)]
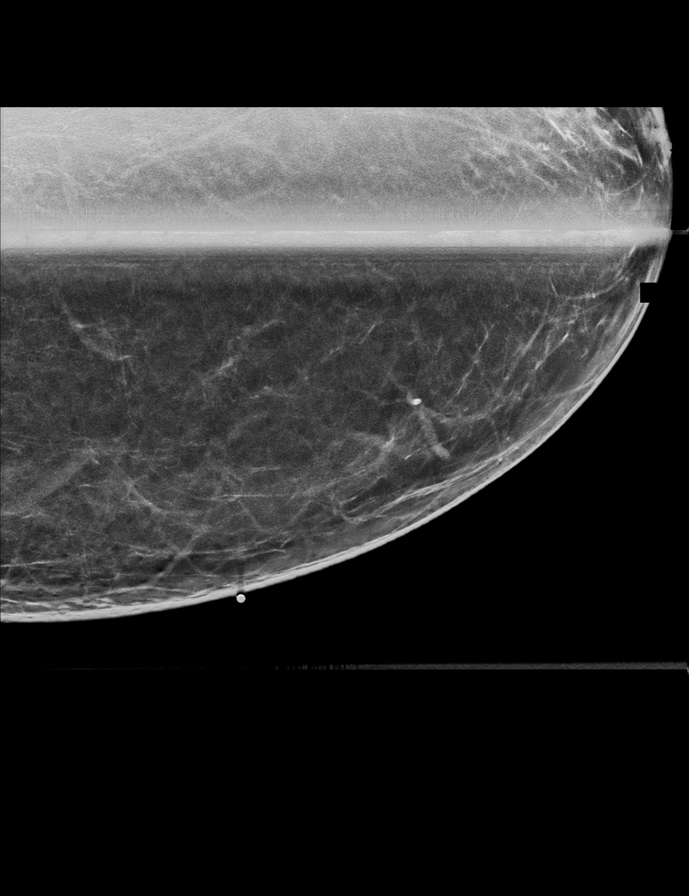

[R CC synth-2D]
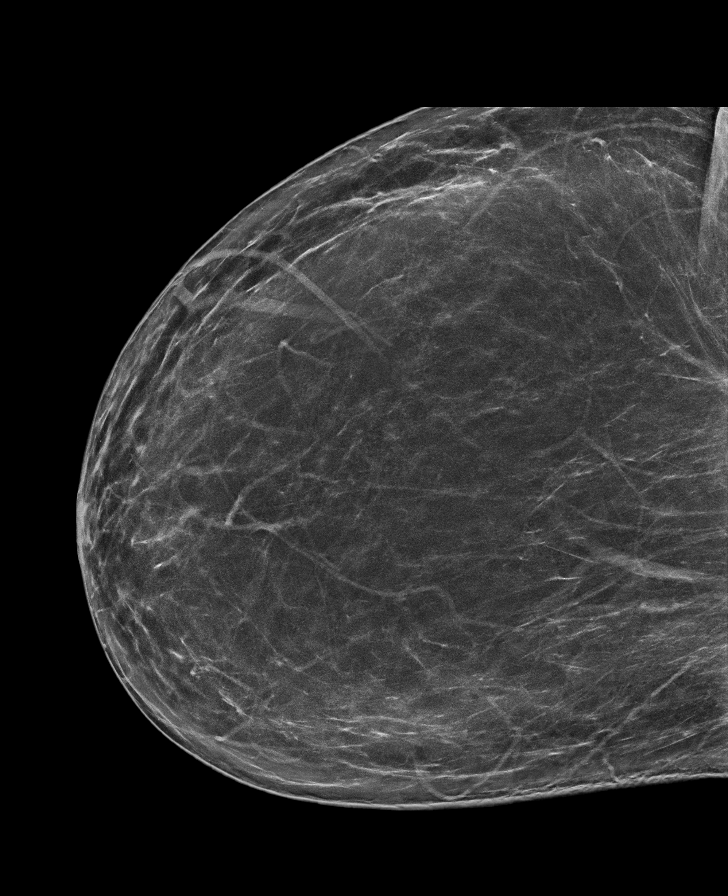

[L MLO synth-2D (1 of 2)]
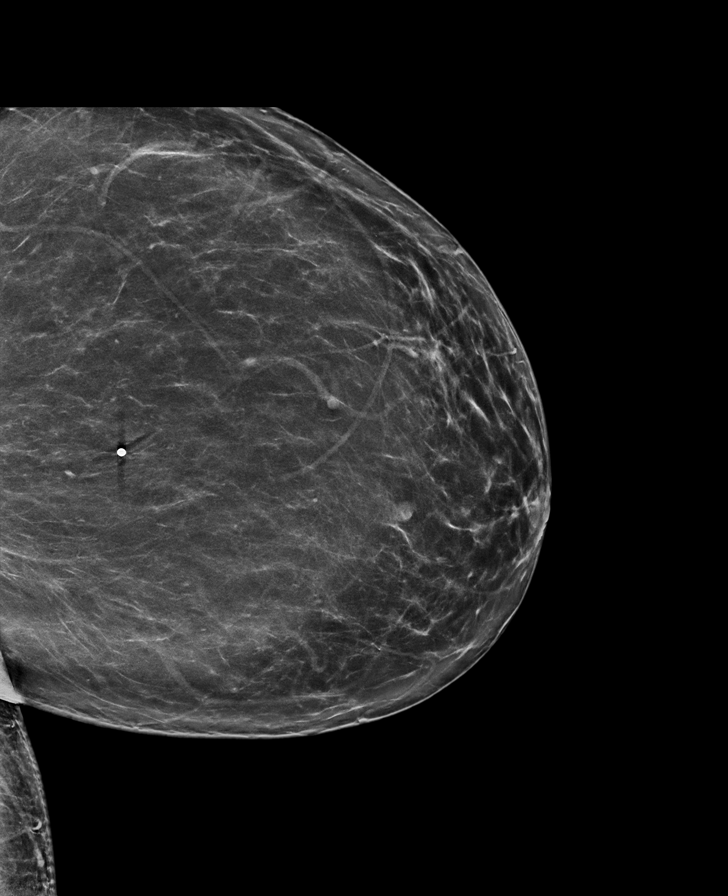

[R MLO synth-2D (2 of 2)]
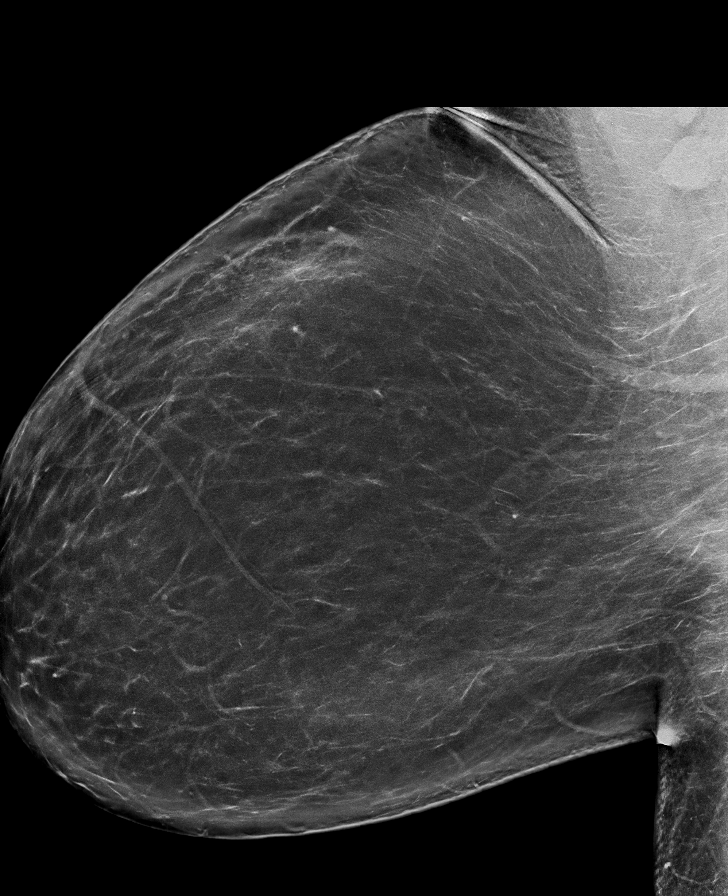

[L MLO synth-2D (2 of 2)]
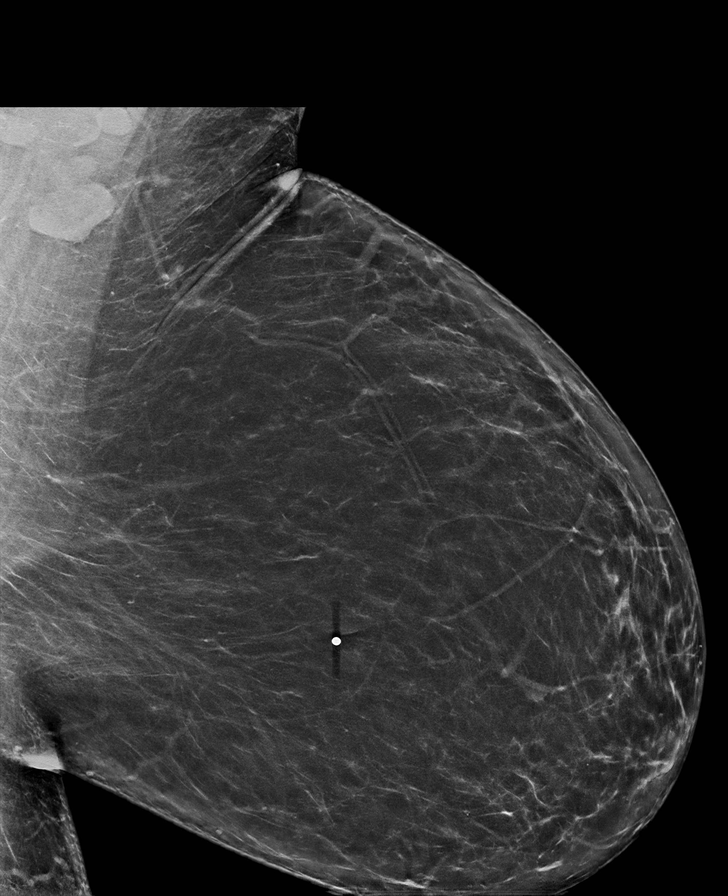

[R CC tomo · tomo slice 43/85.0]
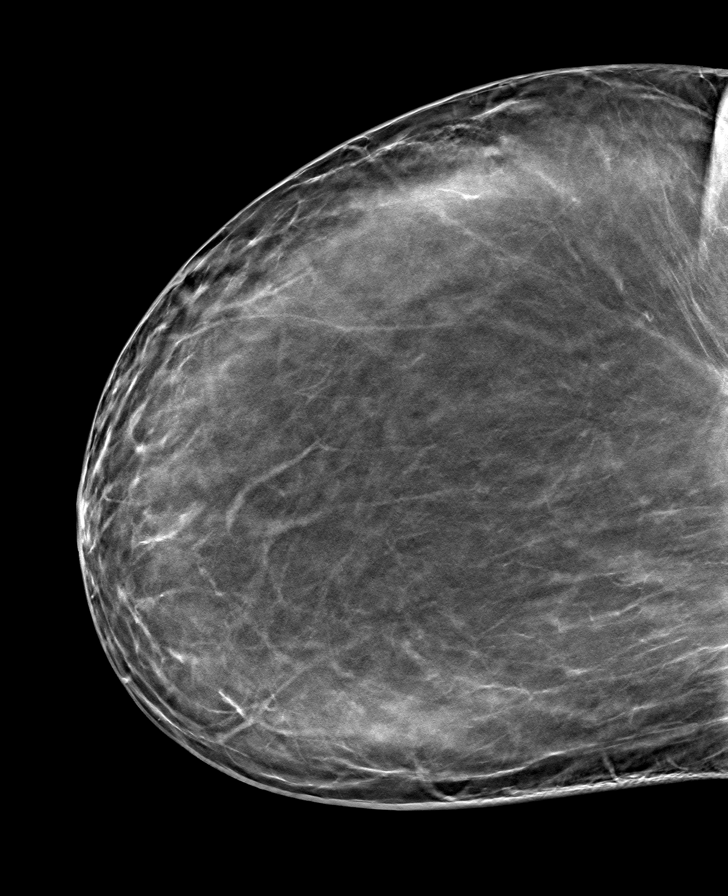

[8 of 40 positions shown; findings below may reference images not displayed]

ACR Breast Density Category b: There are scattered areas of
fibroglandular density.
FINDINGS: Within the lateral left breast anterior depth there is a small oval
circumscribed mass. No additional concerning masses, calcifications
or distortion identified within either breast.

Mammographic images were processed with CAD.

Targeted ultrasound is performed, showing no discrete abnormality
within the left breast 10 o'clock position 11 cm from the nipple.

Within the left breast 3 o'clock position 2 cm from nipple there is
a 6 x 4 x 7 mm oval circumscribed hypoechoic mass, corresponding
with mammographic abnormality.

No left axillary adenopathy.
IMPRESSION: 1. Indeterminate mass left breast 3 o'clock position, potentially
representing an intraductal mass.
2. No suspicious abnormality within the medial left breast at the
site of palpable concern.

RECOMMENDATION:
Ultrasound-guided core needle biopsy left breast mass 3 o'clock
position.

I have discussed the findings and recommendations with the patient.
Results were also provided in writing at the conclusion of the
visit. If applicable, a reminder letter will be sent to the patient
regarding the next appointment.

BI-RADS CATEGORY  4: Suspicious.

## 2019-02-13 IMAGING — US ULTRASOUND LEFT BREAST LIMITED
1 series · 12 of 12 positions shown · non-contrast
Comparison: None

CLINICAL DATA: Patient presents for palpable abnormality within the
medial left breast.

EXAM:
DIGITAL DIAGNOSTIC BILATERAL MAMMOGRAM WITH CAD AND TOMO
ULTRASOUND LEFT BREAST

[Series 1: ultrasound left breast limited · 0.09mm/px · 12 of 12 slices shown]
[im 1/12]
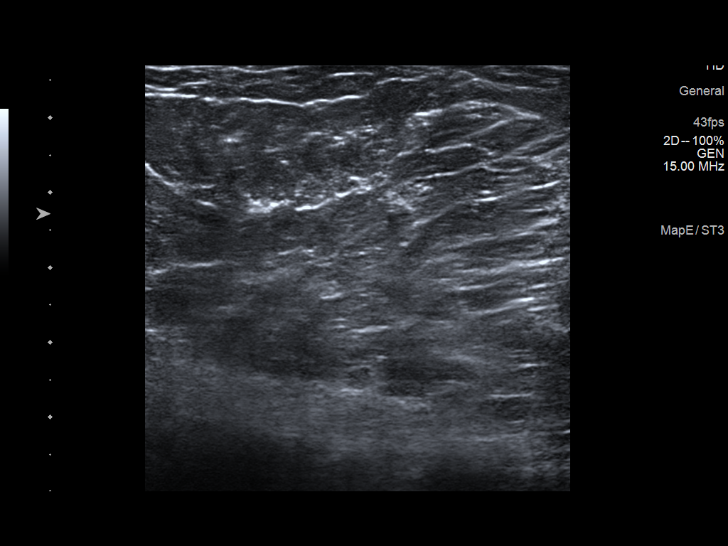
[im 2/12]
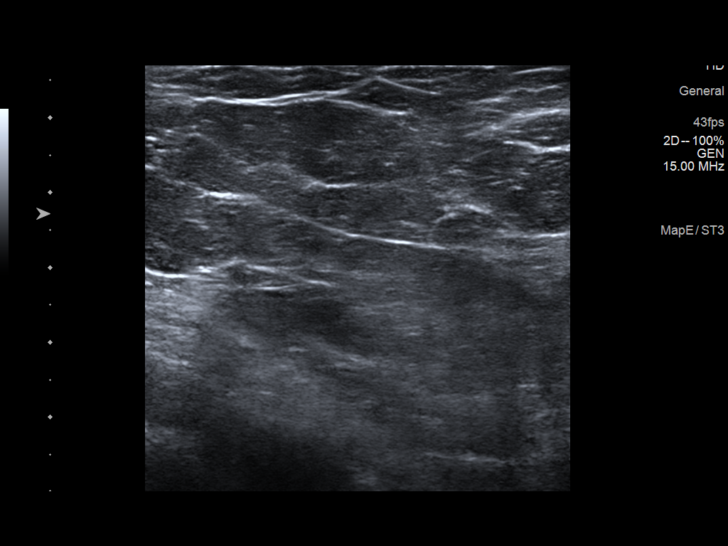
[im 3/12]
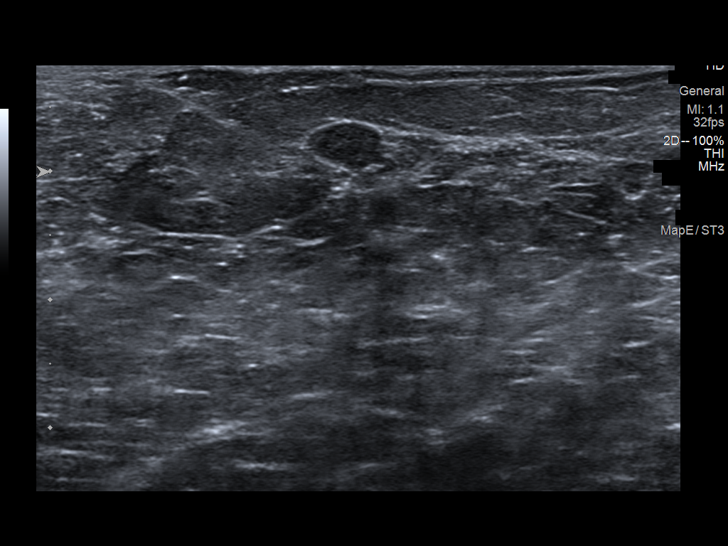
[im 4/12]
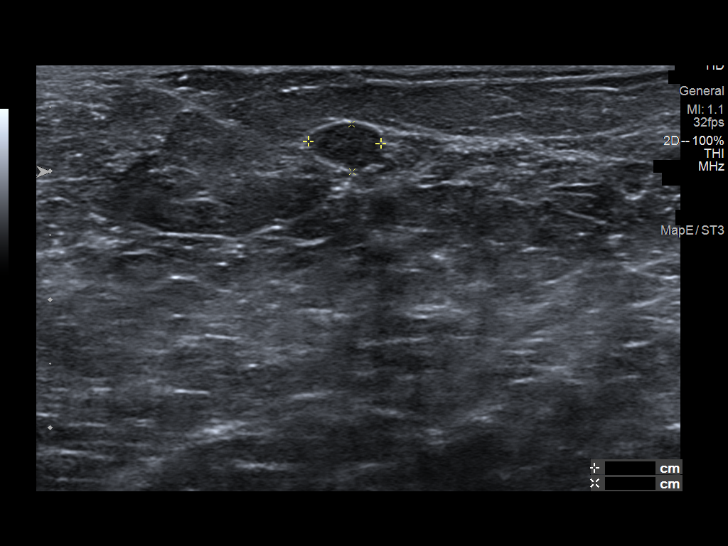
[im 5/12]
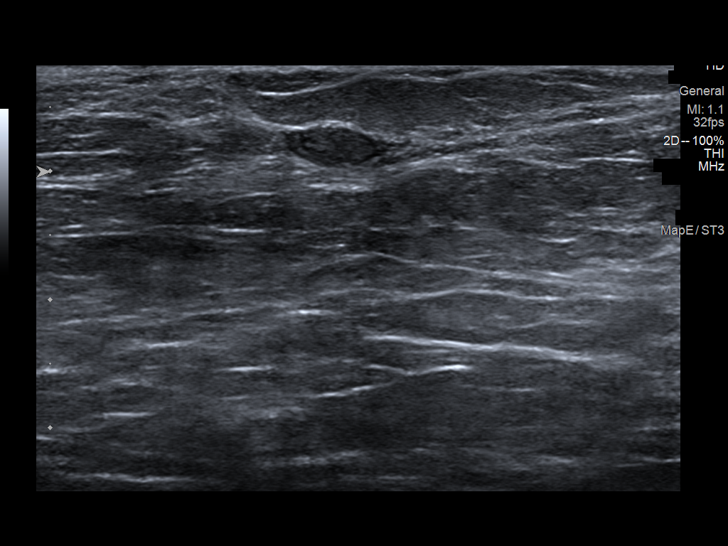
[im 6/12]
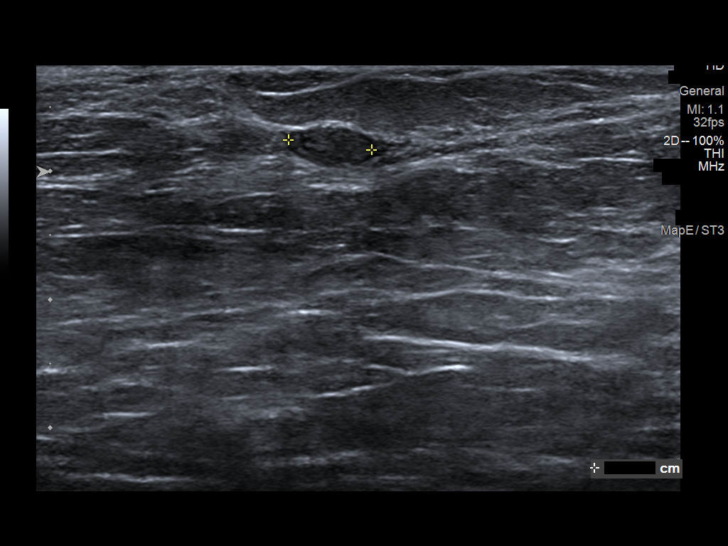
[im 7/12]
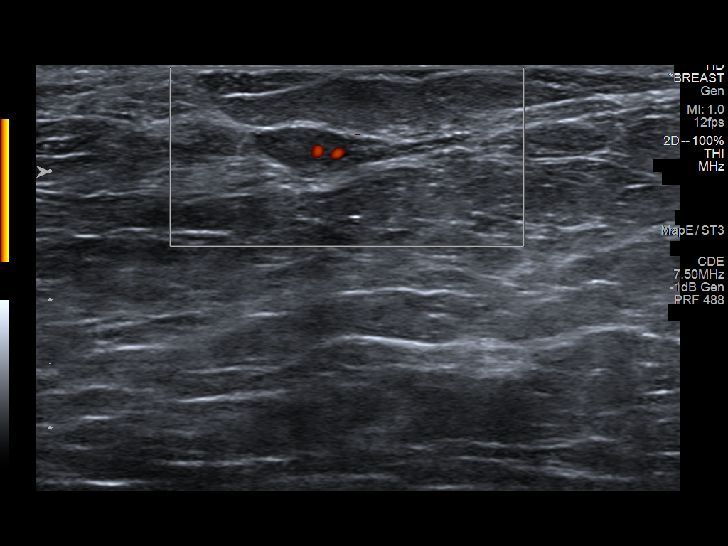
[im 8/12]
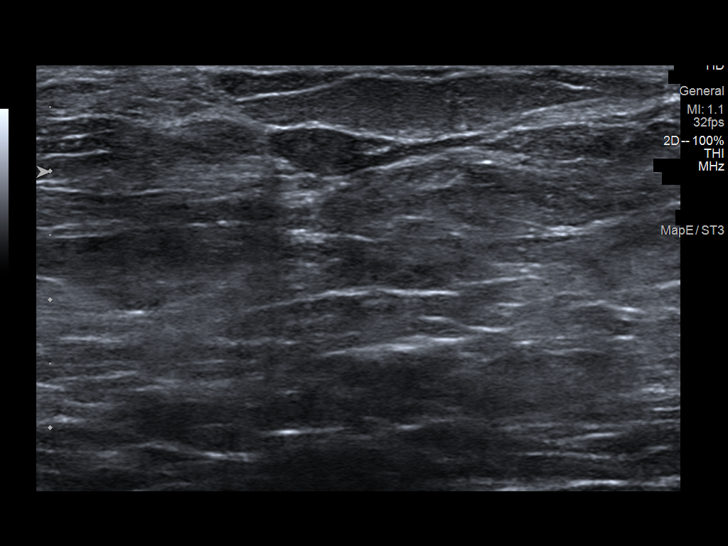
[im 9/12]
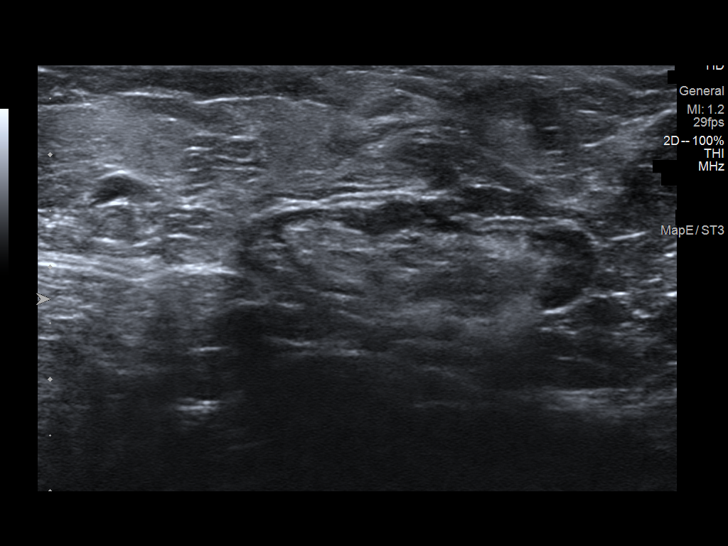
[im 10/12]
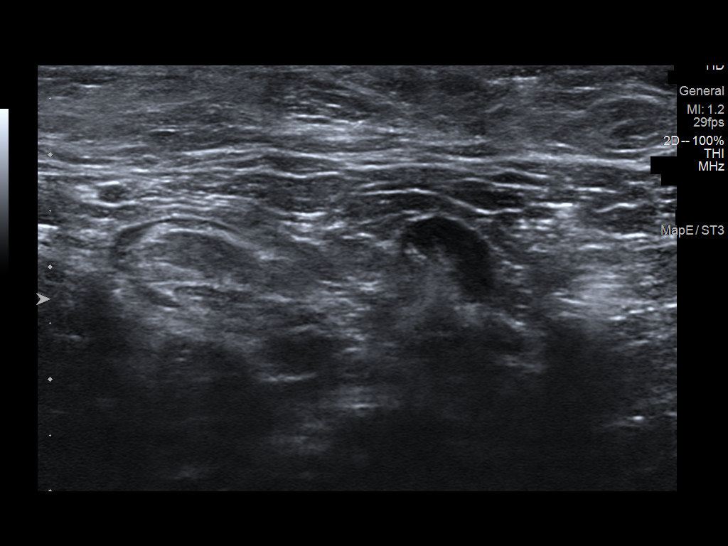
[im 11/12]
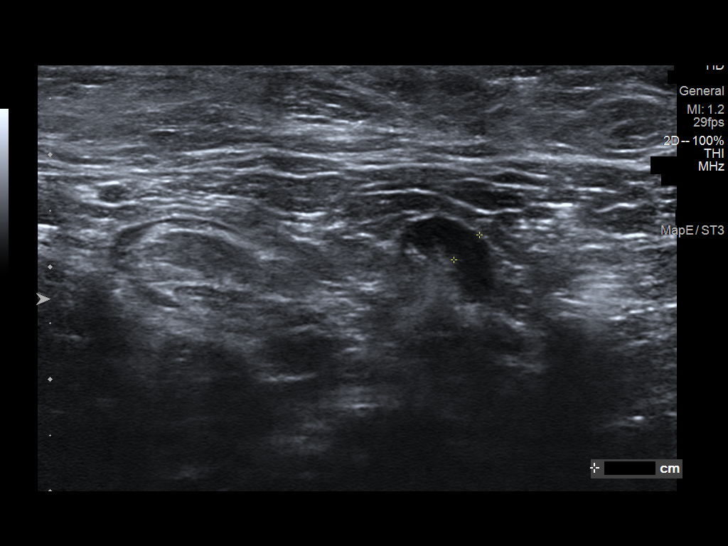
[im 12/12]
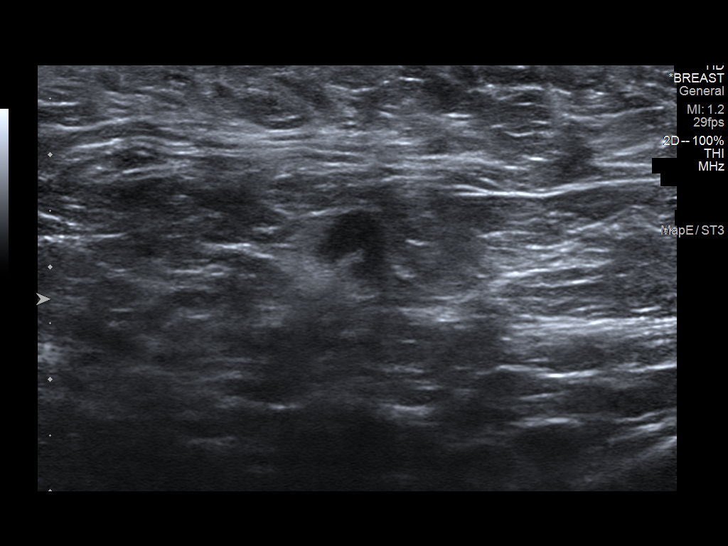

[12 of 12 positions shown; findings below may reference images not displayed]

ACR Breast Density Category b: There are scattered areas of
fibroglandular density.
FINDINGS: Within the lateral left breast anterior depth there is a small oval
circumscribed mass. No additional concerning masses, calcifications
or distortion identified within either breast.

Mammographic images were processed with CAD.

Targeted ultrasound is performed, showing no discrete abnormality
within the left breast 10 o'clock position 11 cm from the nipple.

Within the left breast 3 o'clock position 2 cm from nipple there is
a 6 x 4 x 7 mm oval circumscribed hypoechoic mass, corresponding
with mammographic abnormality.

No left axillary adenopathy.
IMPRESSION: 1. Indeterminate mass left breast 3 o'clock position, potentially
representing an intraductal mass.
2. No suspicious abnormality within the medial left breast at the
site of palpable concern.

RECOMMENDATION:
Ultrasound-guided core needle biopsy left breast mass 3 o'clock
position.

I have discussed the findings and recommendations with the patient.
Results were also provided in writing at the conclusion of the
visit. If applicable, a reminder letter will be sent to the patient
regarding the next appointment.

BI-RADS CATEGORY  4: Suspicious.

## 2019-02-14 ENCOUNTER — Ambulatory Visit
Admission: RE | Admit: 2019-02-14 | Discharge: 2019-02-14 | Disposition: A | Discharge status: Home / Self Care | Payer: No Typology Code available for payment source | Source: Ambulatory Visit | Attending: Obstetrics and Gynecology | Admitting: Obstetrics and Gynecology

## 2019-02-14 ENCOUNTER — Other Ambulatory Visit (HOSPITAL_COMMUNITY): Payer: Self-pay | Admitting: Obstetrics and Gynecology

## 2019-02-14 DIAGNOSIS — N6320 Unspecified lump in the left breast, unspecified quadrant: Secondary | ICD-10-CM

## 2019-02-14 IMAGING — MG MM CLIP PLACEMENT
4 series · 4 of 12 positions shown · non-contrast
Comparison: Previous exam(s).

CLINICAL DATA: Evaluate biopsy marker

EXAM:
DIAGNOSTIC LEFT MAMMOGRAM POST ULTRASOUND BIOPSY

[L CC synth-2D]
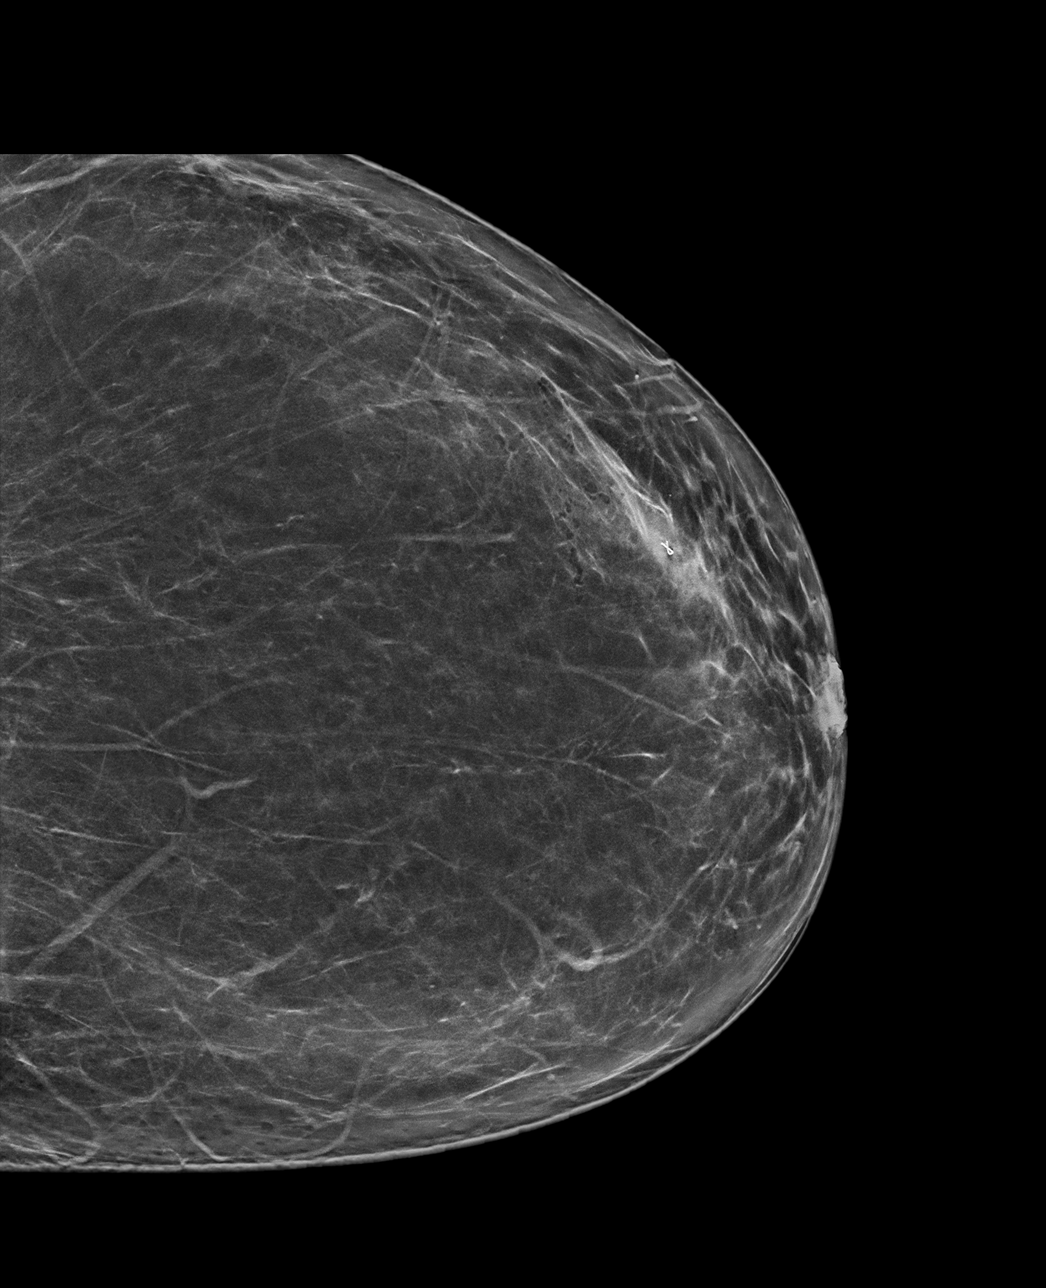

[L ML synth-2D]
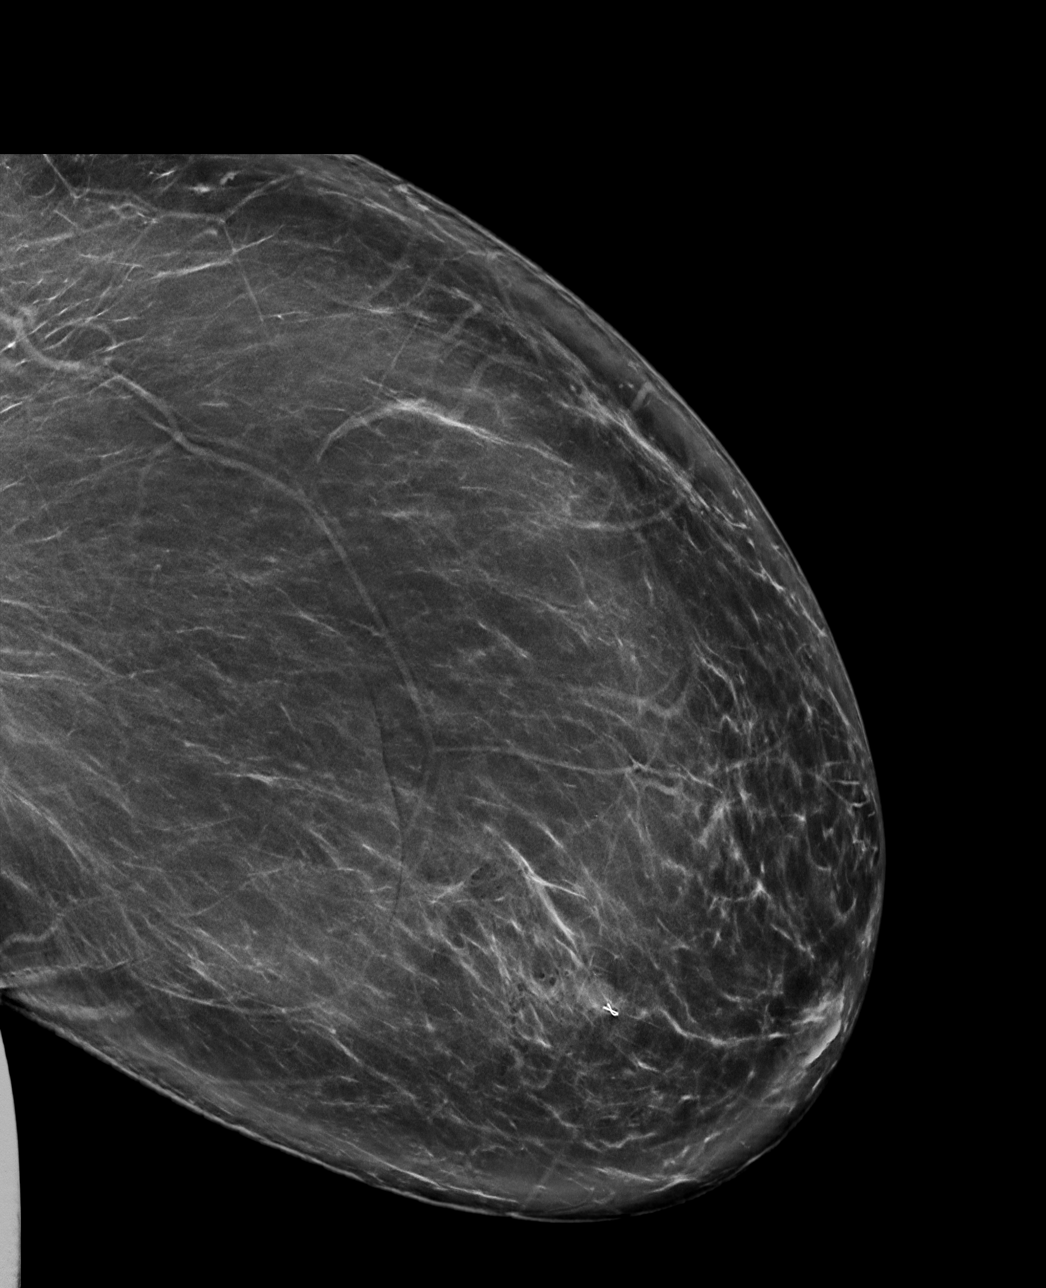

[L ML tomo · tomo slice 47/93.0]
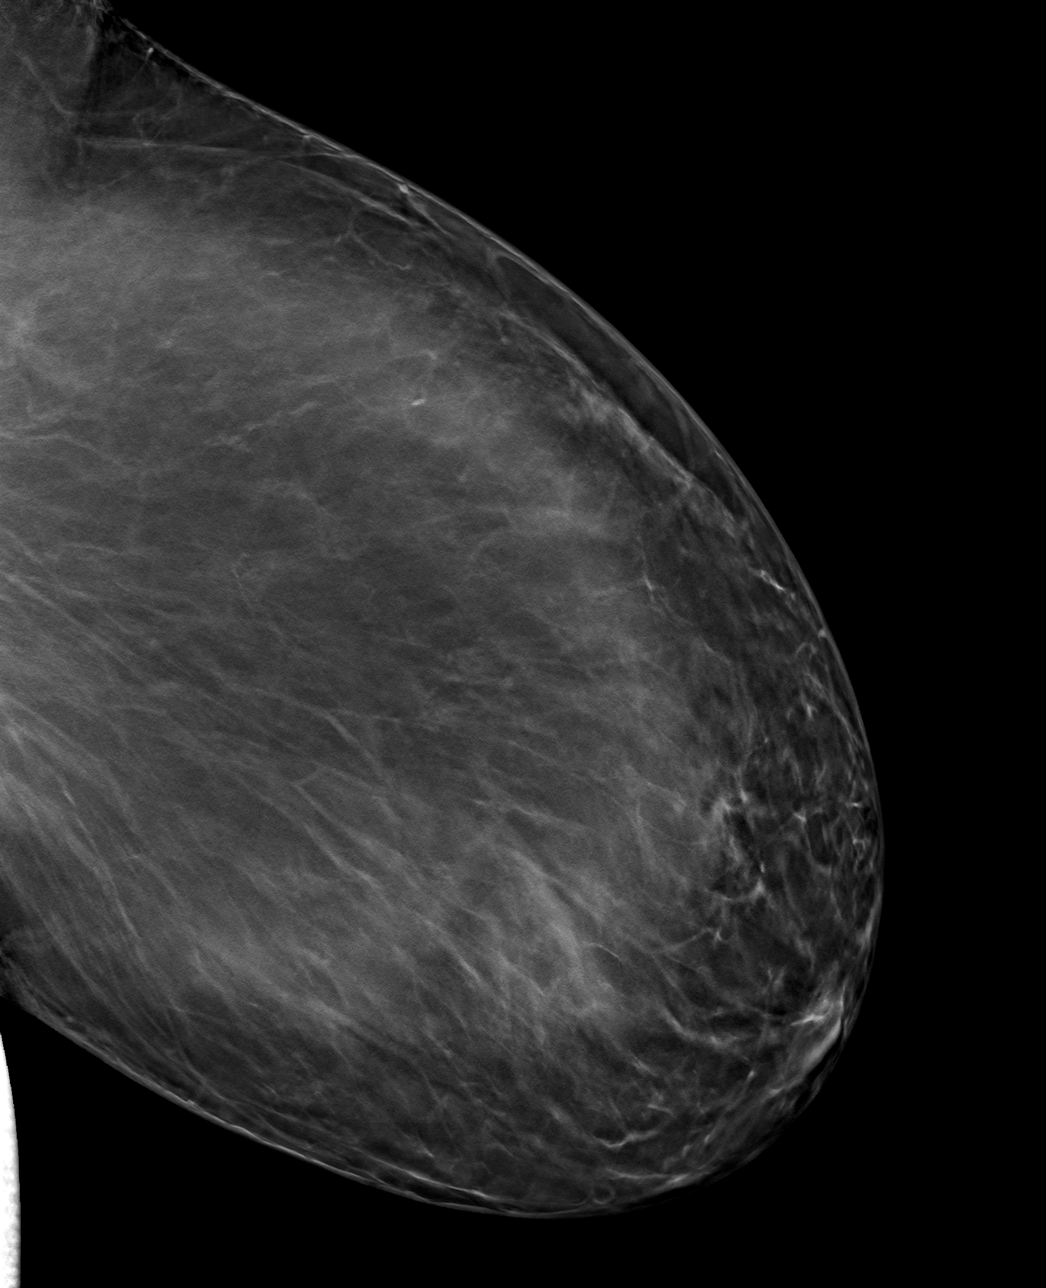

[L CC tomo · tomo slice 41/82.0]
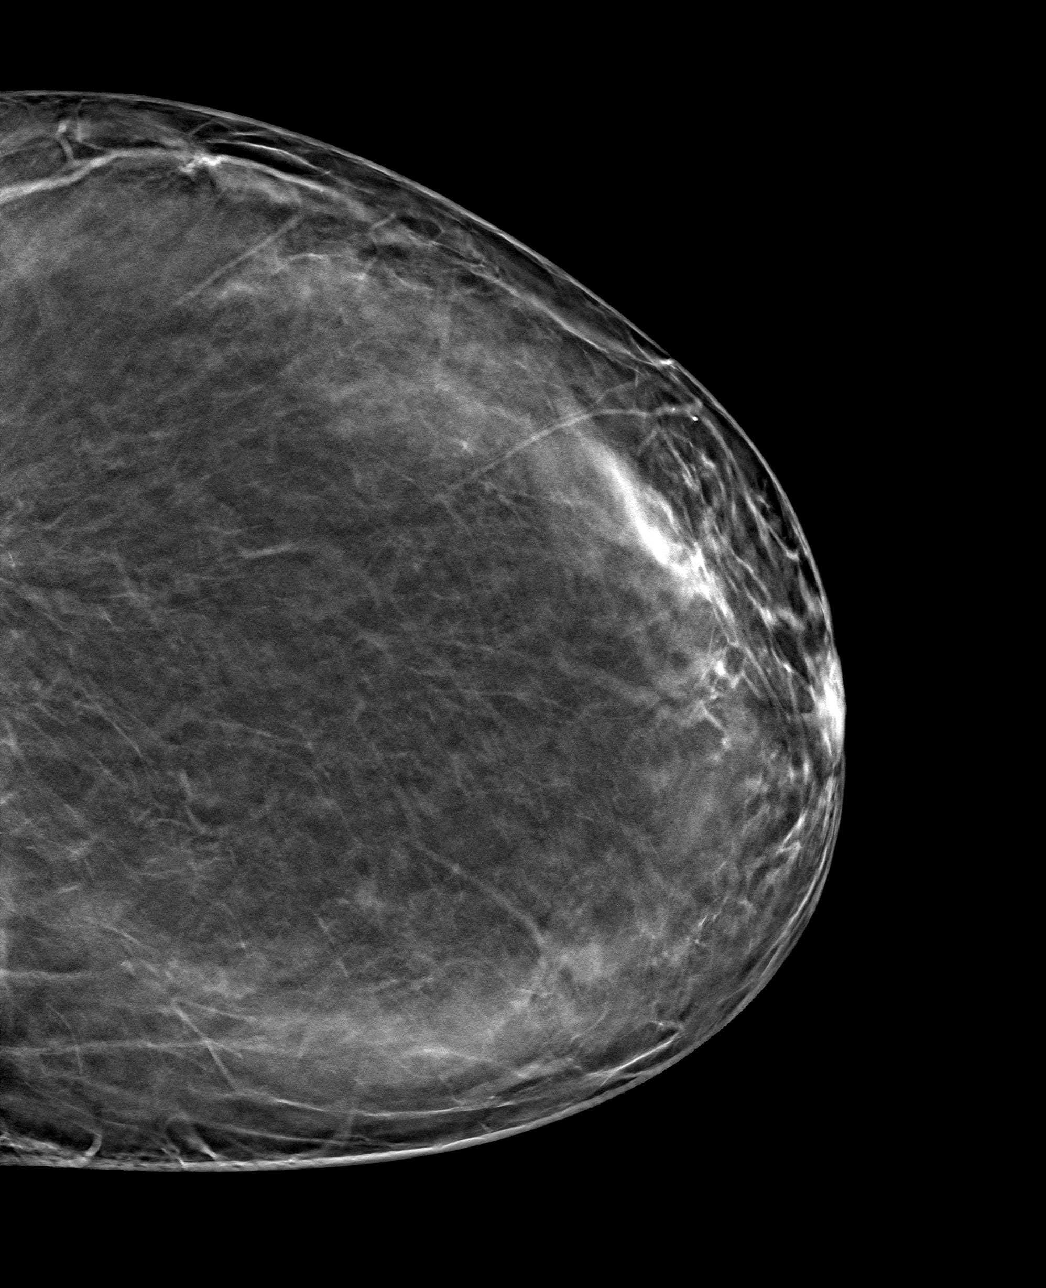

[4 of 12 positions shown; findings below may reference images not displayed]

FINDINGS: Mammographic images were obtained following ultrasound guided biopsy
of a left breast mass. The ribbon shaped biopsy clip is in the
periphery of the biopsied mass.
IMPRESSION: Appropriate biopsy clip placement as above.

Final Assessment: Post Procedure Mammograms for Marker Placement

## 2019-02-14 IMAGING — US US BREAST BX W LOC DEV 1ST LESION IMG BX SPEC US GUIDE*L*
1 series · 12 of 12 positions shown · non-contrast
Comparison: Previous exam(s).
COMPARISON: Previous exam(s).

Addendum:
CLINICAL DATA: Left breast mass at 3 o'clock

EXAM:
ULTRASOUND GUIDED LEFT BREAST CORE NEEDLE BIOPSY

[Series 1: us breast bx w loc dev 1st lesion img bx spec us g · 0.07mm/px · 12 of 12 slices shown]
[im 1/12]
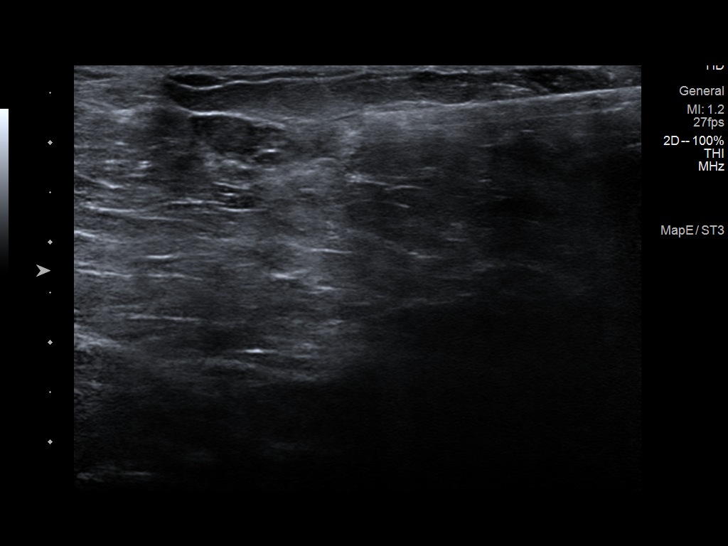
[im 2/12]
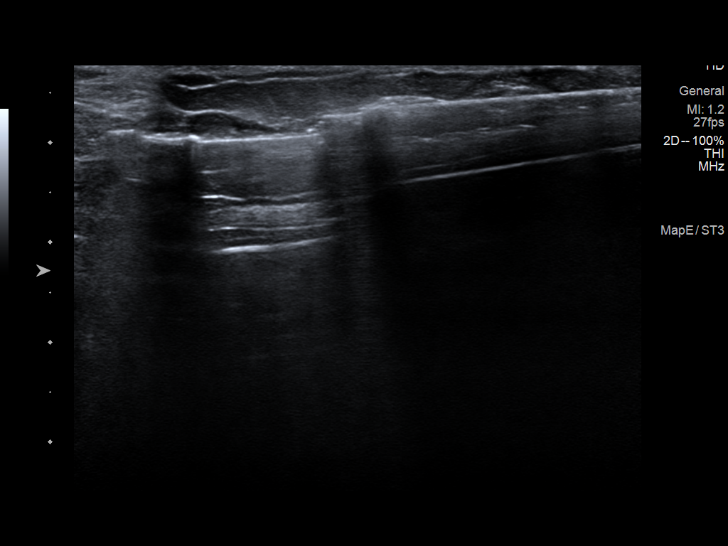
[im 3/12]
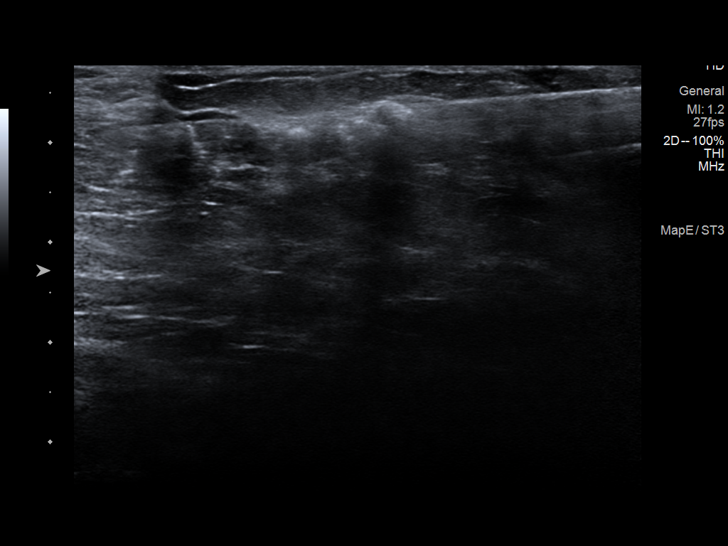
[im 4/12]
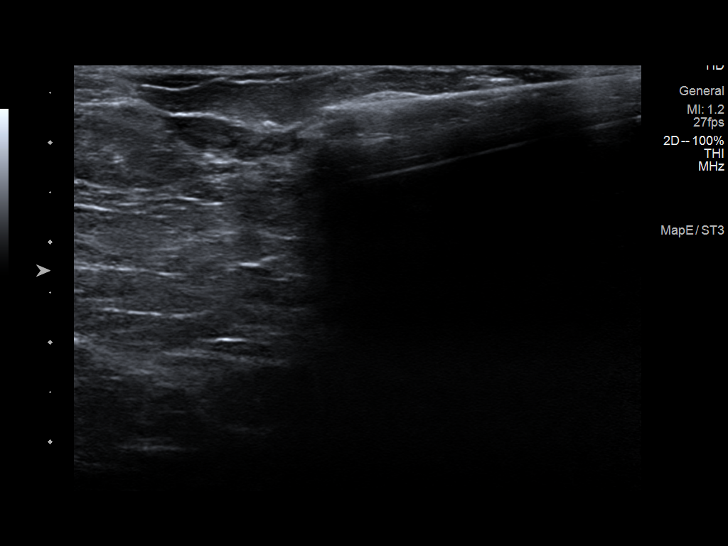
[im 5/12]
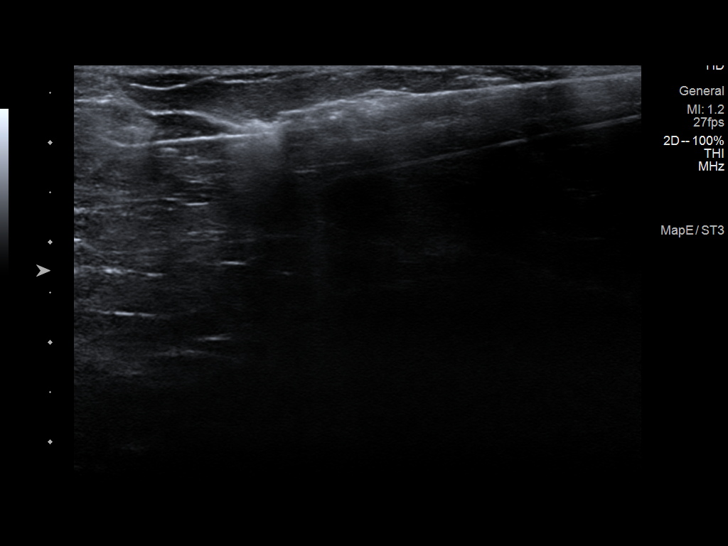
[im 6/12]
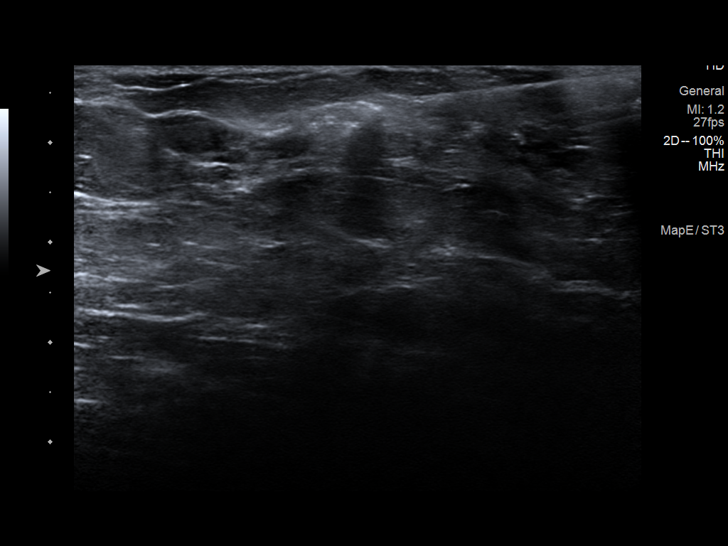
[im 7/12]
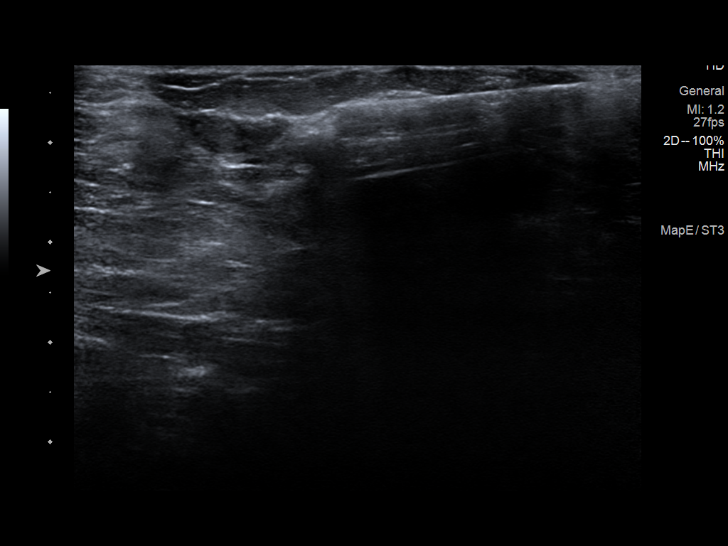
[im 8/12]
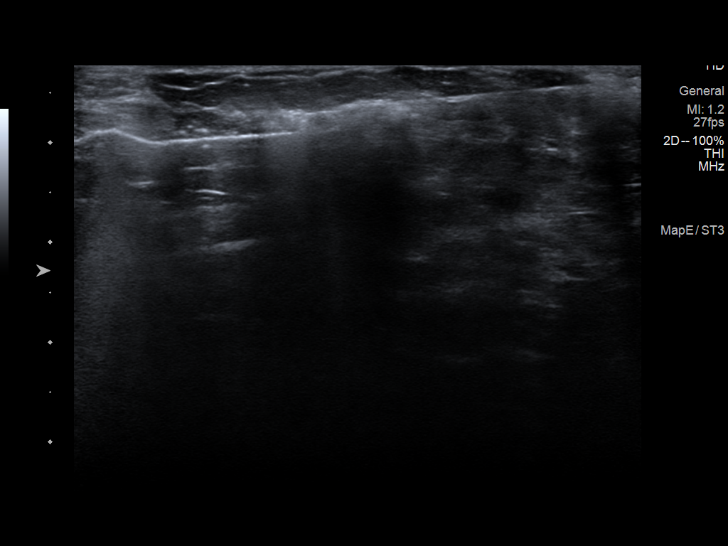
[im 9/12]
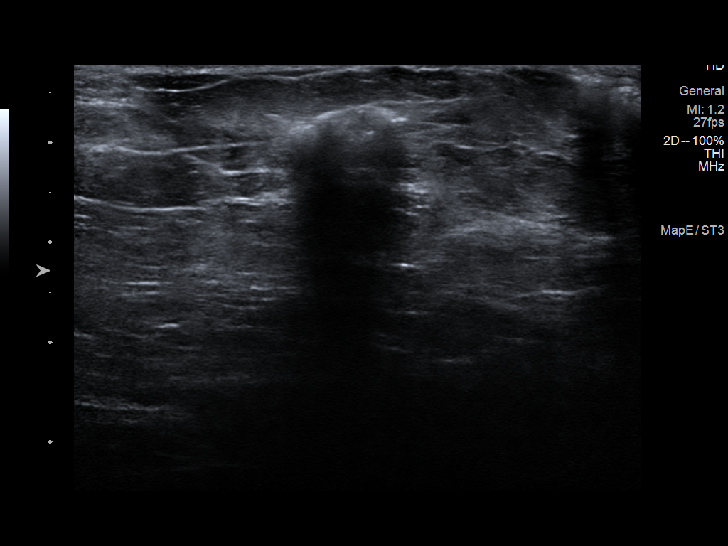
[im 10/12]
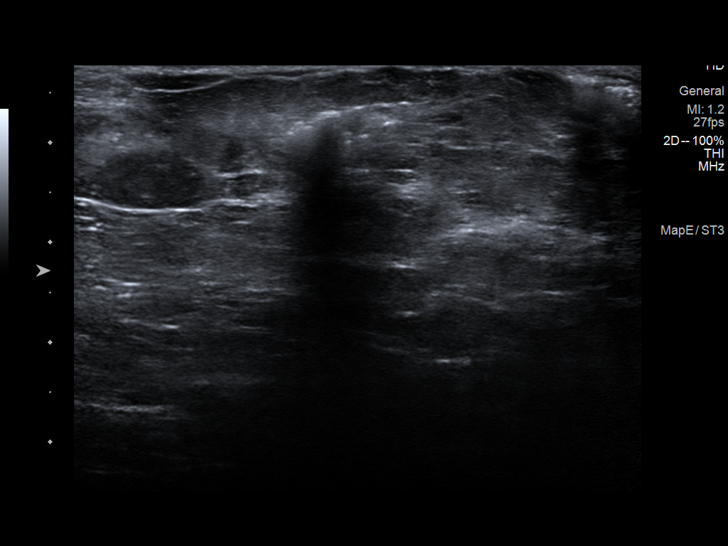
[im 11/12]
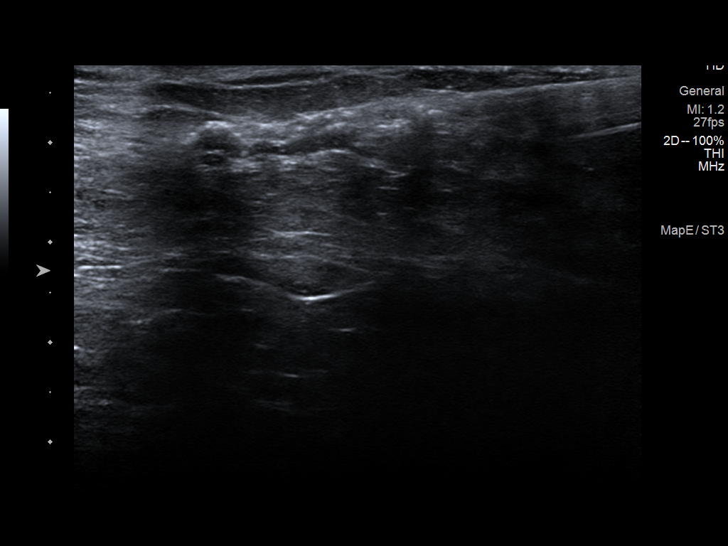
[im 12/12]
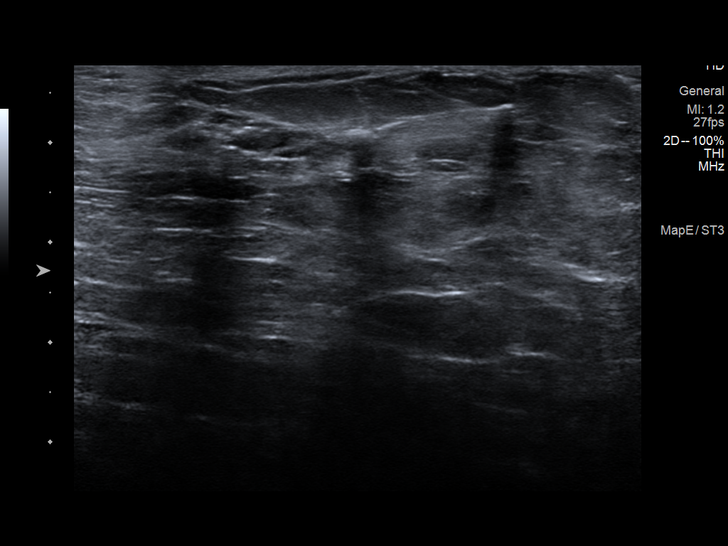

[12 of 12 positions shown; findings below may reference images not displayed]



Lesion quadrant: Upper-outer

Using sterile technique and 1% Lidocaine as local anesthetic, under
direct ultrasound visualization, a 12 gauge AMITY device was
used to perform biopsy of a left breast mass at 3 o'clock using a
lateral approach. At the conclusion of the procedure a ribbon shaped
tissue marker clip was deployed into the biopsy cavity. Follow up 2
view mammogram was performed and dictated separately.
IMPRESSION: Ultrasound guided biopsy of a left breast mass at 3 o'clock. No
apparent complications.

ADDENDUM:
Pathology revealed BENIGN ADENOMA of the LEFT breast, 3 o'clock.
This was found to be concordant by Dr. AMITY.

Pathology results were discussed with the patient by telephone. The
patient reported doing well after the biopsy with tenderness at the
site. Post biopsy instructions and care were reviewed and questions
were answered. The patient was encouraged to call The [REDACTED]

The patient was instructed to continue with monthly self breast
examinations, clinical follow-up as needed, and to return for annual
mammography at 39. The patient was informed a reminder notice would
be sent regarding this appointment.

Pathology results reported by AMITY, RN on [DATE].



Lesion quadrant: Upper-outer

Using sterile technique and 1% Lidocaine as local anesthetic, under
direct ultrasound visualization, a 12 gauge AMITY device was
used to perform biopsy of a left breast mass at 3 o'clock using a
lateral approach. At the conclusion of the procedure a ribbon shaped
tissue marker clip was deployed into the biopsy cavity. Follow up 2
view mammogram was performed and dictated separately.
IMPRESSION: Ultrasound guided biopsy of a left breast mass at 3 o'clock. No
apparent complications.

## 2019-03-14 ENCOUNTER — Encounter (HOSPITAL_COMMUNITY): Payer: Self-pay | Admitting: *Deleted

## 2019-03-17 ENCOUNTER — Telehealth: Payer: Self-pay

## 2019-03-17 NOTE — Telephone Encounter (Signed)
Patient states that she was sent home work on Friday because she had a fever. Her fever ranged from 107 to 101.6. Patient states that she has had a cough but she does have allergies. Patient states that she checked her temp today and it was 97.0.  Patient needs a note before she can return back to work. Please advise

## 2019-03-17 NOTE — Telephone Encounter (Signed)
Left a vm for patient to callback and schedule appointment 

## 2019-03-18 ENCOUNTER — Encounter: Payer: Self-pay | Admitting: Family Medicine

## 2019-03-18 ENCOUNTER — Ambulatory Visit (INDEPENDENT_AMBULATORY_CARE_PROVIDER_SITE_OTHER): Payer: Self-pay | Admitting: Family Medicine

## 2019-03-18 ENCOUNTER — Other Ambulatory Visit: Payer: Self-pay

## 2019-03-18 VITALS — BP 140/82 | HR 80 | Temp 97.5°F | Ht 65.75 in | Wt 274.0 lb

## 2019-03-18 DIAGNOSIS — N644 Mastodynia: Secondary | ICD-10-CM

## 2019-03-18 DIAGNOSIS — E66813 Obesity, class 3: Secondary | ICD-10-CM

## 2019-03-18 DIAGNOSIS — Z6841 Body Mass Index (BMI) 40.0 and over, adult: Secondary | ICD-10-CM

## 2019-03-18 DIAGNOSIS — N6320 Unspecified lump in the left breast, unspecified quadrant: Secondary | ICD-10-CM

## 2019-03-18 DIAGNOSIS — Z09 Encounter for follow-up examination after completed treatment for conditions other than malignant neoplasm: Secondary | ICD-10-CM

## 2019-03-18 DIAGNOSIS — F17200 Nicotine dependence, unspecified, uncomplicated: Secondary | ICD-10-CM

## 2019-03-18 DIAGNOSIS — Z716 Tobacco abuse counseling: Secondary | ICD-10-CM

## 2019-03-18 NOTE — Progress Notes (Signed)
[facility]    Established Patient Office Visit  Subjective:  Patient ID: Ariana Rogers, female    DOB: 1986/05/04  Age: 33 y.o. MRN: [id number]  CC:  Chief Complaint  Patient presents with  . Work note  . Fever  . Cough    HPI [name] is a 33 year old female who presents for Follow Up today.   History reviewed. No pertinent past medical history.  Past Surgical History:  Procedure Laterality Date  . DENTAL SURGERY    . TUBAL LIGATION  2 years ago   Current Status: Since her last office visit, she is doing well with no complaints. She qualified for [employer] program for Breast Cancer evaluation. She recently had breast biopsy because of left breast mass. Results were negative. She reports a mild cough today. She smokes 1 pack of cigarettes a day.   She denies chills, fatigue, recent infections, weight loss, and night sweats. She has not had any headaches, visual changes, dizziness, and falls. No chest pain, heart palpitations, and shortness of breath reported. No reports of GI problems such as nausea, vomiting, diarrhea, and constipation. She has no reports of blood in stools, dysuria and hematuria. No depression or anxiety reported. She denies pain today.   Family History  Problem Relation Age of Onset  . Hypertension Mother   . Arthritis Mother   . Fibromyalgia Mother   . Other Father        no health concern    Social History   Socioeconomic History  . Marital status: Single    Spouse name: Not on file  . Number of children: 4  . Years of education: Not on file  . Highest education level: Some college, no degree  Occupational History  . Not on file  Social Needs  . Financial resource strain: Not on file  . Food insecurity:    Worry: Not on file    Inability: Not on file  . Transportation needs:    Medical: No    Non-medical: No  Tobacco Use  . Smoking status: Current Every Day Smoker    Packs/day: 0.50   Types: Cigarettes  . Smokeless tobacco: Never Used  Substance and Sexual Activity  . Alcohol use: Yes    Comment: occasionally  . Drug use: No  . Sexual activity: Yes    Birth control/protection: Surgical  Lifestyle  . Physical activity:    Days per week: Not on file    Minutes per session: Not on file  . Stress: Not on file  Relationships  . Social connections:    Talks on phone: Not on file    Gets together: Not on file    Attends religious service: Not on file    Active member of club or organization: Not on file    Attends meetings of clubs or organizations: Not on file    Relationship status: Not on file  . Intimate partner violence:    Fear of current or ex partner: Not on file    Emotionally abused: Not on file    Physically abused: Not on file    Forced sexual activity: Not on file  Other Topics Concern  . Not on file  Social History Narrative  . Not on file    Outpatient Medications Prior to Visit  Medication Sig Dispense Refill  . Calcium Carbonate-Vitamin D (CALCIUM 600+D HIGH POTENCY PO) Take 1 capsule by mouth daily.    Marland Kitchen  vitamin B-12 (CYANOCOBALAMIN) 1000 MCG tablet Take 1,000 mcg by mouth daily.    . cetirizine (ZYRTEC) 10 MG tablet Take 1 tablet (10 mg total) by mouth daily. (Patient not taking: Reported on 03/18/2019) 30 tablet 0  . fluticasone (FLONASE) 50 MCG/ACT nasal spray Place 2 sprays into both nostrils daily. (Patient not taking: Reported on 03/18/2019) 16 g 6  . chlorhexidine (HIBICLENS) 4 % external liquid Apply topically daily as needed. Wash twice weekly in the shower (Patient not taking: Reported on 03/18/2019) 120 mL 0  . ketoconazole (NIZORAL) 2 % cream Apply 1 application topically daily. (Patient not taking: Reported on 03/18/2019) 15 g 0  . permethrin (ELIMITE) 5 % cream Apply to entire body other than face - let sit for 14 hours then wash off, may repeat in 1 week if still having symptoms (Patient not taking: Reported on 03/18/2019) 60 g 1   No  facility-administered medications prior to visit.     No Known Allergies  ROS Review of Systems  Constitutional: Negative.   HENT: Negative.   Eyes: Negative.   Respiratory: Positive for cough (mild; occasional).   Cardiovascular: Negative.   Gastrointestinal: Negative.   Endocrine: Negative.   Genitourinary: Negative.   Musculoskeletal: Negative.   Skin: Negative.   Allergic/Immunologic: Negative.   Neurological: Positive for dizziness and headaches.  Hematological: Negative.   Psychiatric/Behavioral: Negative.       Objective:    Physical Exam  Constitutional: She is oriented to person, place, and time. She appears well-developed and well-nourished.  HENT:  Head: Normocephalic and atraumatic.  Eyes: Conjunctivae are normal.  Neck: Normal range of motion. Neck supple.  Cardiovascular: Normal rate, regular rhythm, normal heart sounds and intact distal pulses.  Pulmonary/Chest: Effort normal and breath sounds normal.  Abdominal: Soft. Bowel sounds are normal.  Musculoskeletal: Normal range of motion.  Neurological: She is alert and oriented to person, place, and time. She has normal reflexes.  Skin: Skin is warm and dry.  Psychiatric: She has a normal mood and affect. Her behavior is normal. Judgment and thought content normal.  Nursing note and vitals reviewed.   BP 140/82 (BP Location: Left Arm, Patient Position: Sitting, Cuff Size: Large)   Pulse 80   Temp (!) 97.5 F (36.4 C) (Oral)   Ht 5' 5.75" (1.67 m)   Wt 274 lb (124.3 kg)   LMP 03/18/2019   SpO2 98%   BMI 44.56 kg/m  Wt Readings from Last 3 Encounters:  03/18/19 274 lb (124.3 kg)  02/11/19 272 lb (123.4 kg)  01/28/19 273 lb (123.8 kg)     Health Maintenance Due  Topic Date Due  . HIV Screening  10/27/2000    There are no preventive care reminders to display for this patient.  Lab Results  Component Value Date   TSH 0.978 01/15/2018   Lab Results  Component Value Date   WBC 12.1 (H)  01/15/2018   HGB 14.3 01/15/2018   HCT 43.1 01/15/2018   MCV 87 01/15/2018   PLT 244 01/15/2018   Lab Results  Component Value Date   NA 143 01/15/2018   K 4.3 01/15/2018   CO2 20 01/15/2018   GLUCOSE 91 01/15/2018   BUN 12 01/15/2018   CREATININE 0.88 01/15/2018   BILITOT 0.3 01/15/2018   ALKPHOS 55 01/15/2018   AST 14 01/15/2018   ALT 18 01/15/2018   PROT 6.4 01/15/2018   ALBUMIN 4.0 01/15/2018   CALCIUM 8.6 (L) 01/15/2018   ANIONGAP 7 05/27/2017  No results found for: CHOL No results found for: HDL No results found for: LDLCALC No results found for: TRIG No results found for: [facility] Lab Results  Component Value Date   HGBA1C 5.9 01/15/2018   Assessment & Plan:   1. Current smoker  2. Class 3 severe obesity due to excess calories without serious comorbidity with body mass index (BMI) of 40.0 to 44.9 in adult [facility]) Body mass index is 44.56 kg/m. Goal BMI  is <30. Encouraged efforts to reduce weight include engaging in physical activity as tolerated with goal of 150 minutes per week. Improve dietary choices and eat a meal regimen consistent with a Mediterranean or DASH diet. Reduce simple carbohydrates. Do not skip meals and eat healthy snacks throughout the day to avoid over-eating at dinner. Set a goal weight loss that is achievable for you.  3. Encounter for smoking cessation counseling Discussed benefits of quitting smoking today.   4. Breast mass, left Fibrocytic breast.   5. Painful lumpy left breast Resolved.   6. Follow up She will follow up in 6 months.  Doctor's excuse written today.    No orders of the defined types were placed in this encounter.   No orders of the defined types were placed in this encounter.   Referral Orders  No referral(s) requested today    Raliegh Ip,  MSN, FNP-C [facility] [address] [place], [place] [address] [phone]    Problem List Items Addressed This Visit       Other   Breast mass, left   Class 3 severe obesity due to excess calories without serious comorbidity with body mass index (BMI) of 40.0 to 44.9 in adult [facility])   Painful lumpy left breast    Other Visit Diagnoses    Current smoker    -  Primary   Encounter for smoking cessation counseling       Follow up          No orders of the defined types were placed in this encounter.   Follow-up: Return in about 6 months (around 09/18/2019).    Kallie Locks, FNP

## 2019-03-31 DIAGNOSIS — K219 Gastro-esophageal reflux disease without esophagitis: Secondary | ICD-10-CM

## 2019-03-31 DIAGNOSIS — R059 Cough, unspecified: Secondary | ICD-10-CM

## 2019-03-31 HISTORY — DX: Cough, unspecified: R05.9

## 2019-03-31 HISTORY — DX: Gastro-esophageal reflux disease without esophagitis: K21.9

## 2019-04-01 ENCOUNTER — Encounter (HOSPITAL_COMMUNITY): Payer: Self-pay

## 2019-04-01 ENCOUNTER — Ambulatory Visit: Payer: Self-pay | Admitting: Family Medicine

## 2019-04-01 ENCOUNTER — Emergency Department (HOSPITAL_COMMUNITY)
Admission: EM | Admit: 2019-04-01 | Discharge: 2019-04-01 | Disposition: A | Discharge status: Home / Self Care | Payer: Self-pay | Attending: Emergency Medicine | Admitting: Emergency Medicine

## 2019-04-01 ENCOUNTER — Other Ambulatory Visit: Payer: Self-pay

## 2019-04-01 ENCOUNTER — Emergency Department (HOSPITAL_COMMUNITY): Payer: Self-pay

## 2019-04-01 DIAGNOSIS — F17210 Nicotine dependence, cigarettes, uncomplicated: Secondary | ICD-10-CM | POA: Insufficient documentation

## 2019-04-01 DIAGNOSIS — J40 Bronchitis, not specified as acute or chronic: Secondary | ICD-10-CM | POA: Insufficient documentation

## 2019-04-01 DIAGNOSIS — R0602 Shortness of breath: Secondary | ICD-10-CM | POA: Insufficient documentation

## 2019-04-01 IMAGING — CR CHEST - 2 VIEW
2 series · 2 of 2 positions shown · non-contrast
Comparison: Thoracic spine radiographs dated [DATE].

CLINICAL DATA: Cough for the past 2 weeks. Smoker.

EXAM:
CHEST - 2 VIEW

[chest pa]
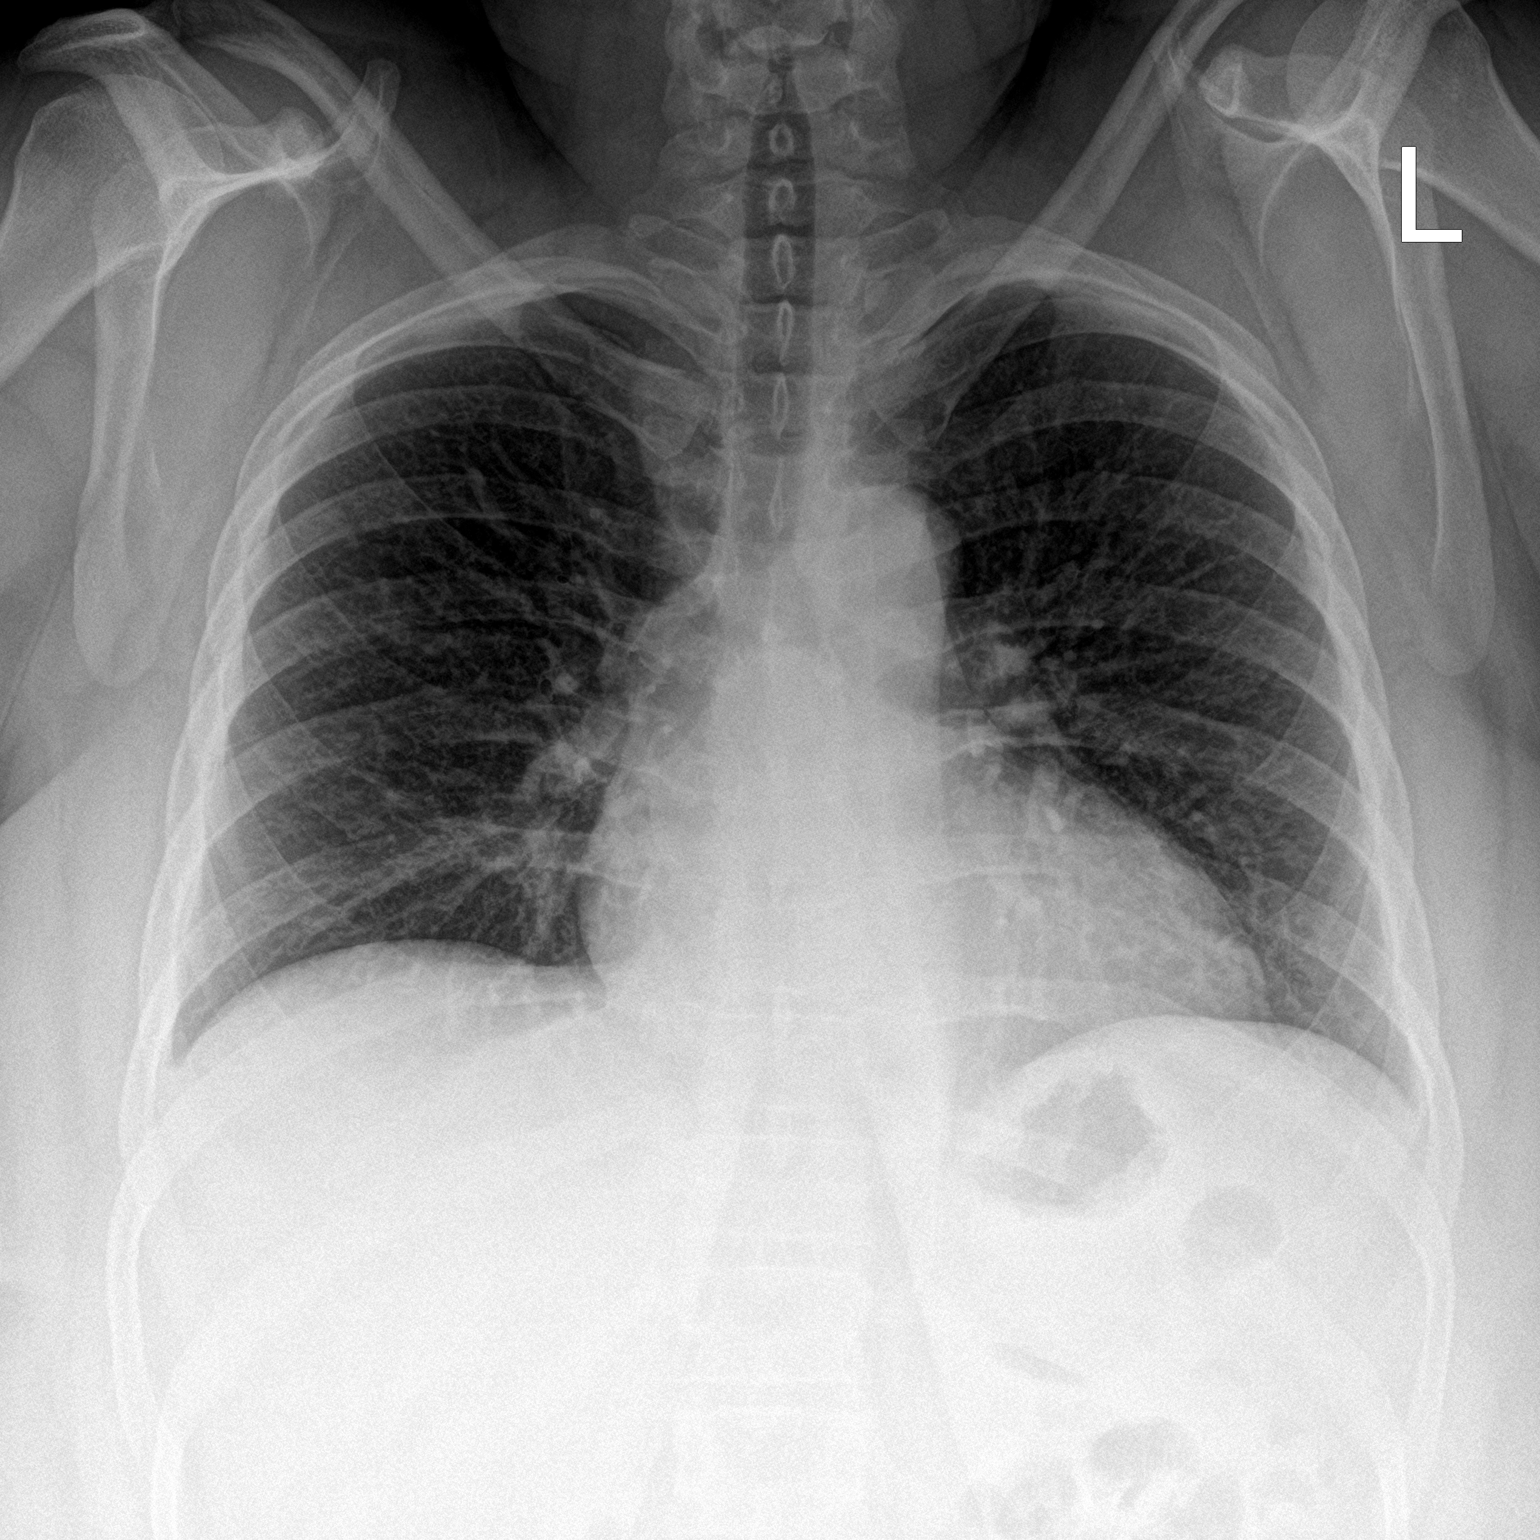

[chest lat]
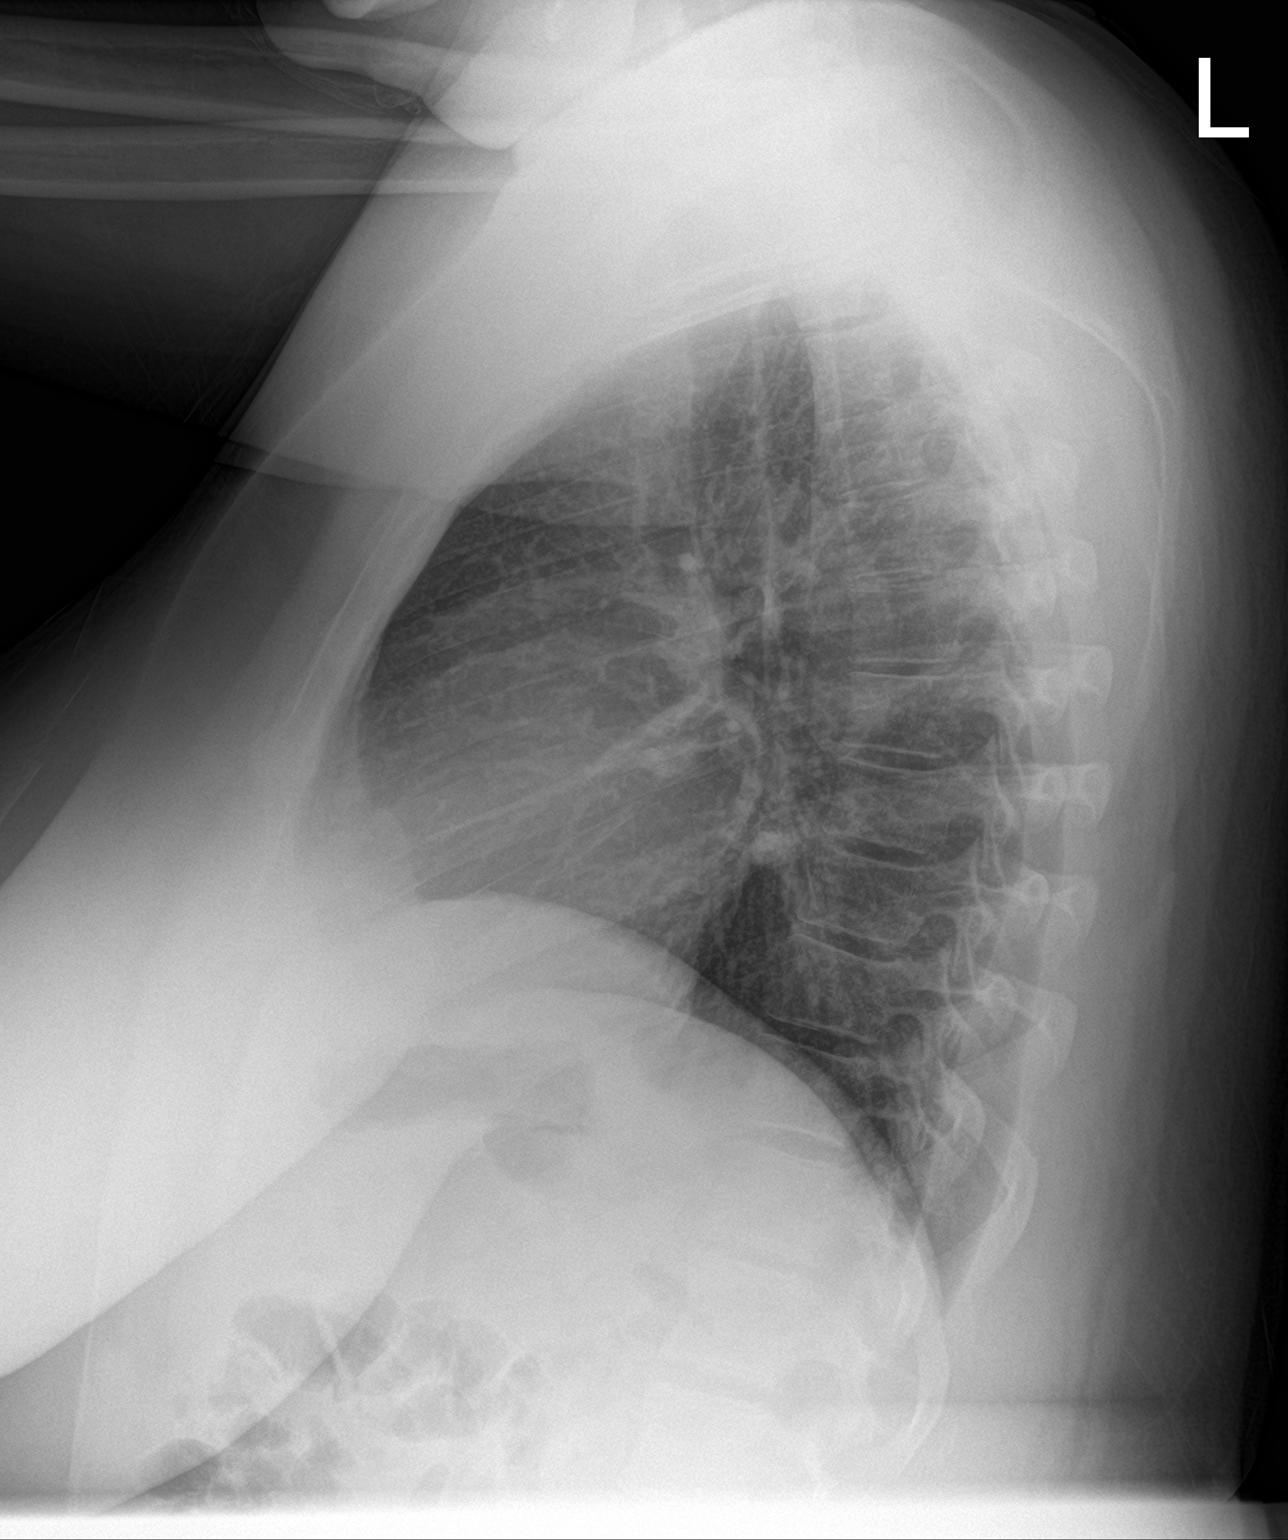

[2 of 2 positions shown; findings below may reference images not displayed]

FINDINGS: Poor inspiration. Grossly normal sized heart. Clear lungs. Mild
diffuse peribronchial thickening. Unremarkable bones.
IMPRESSION: Mild bronchitic changes.

## 2019-04-01 MED ORDER — ALBUTEROL SULFATE HFA 108 (90 BASE) MCG/ACT IN AERS: 1.0000 | INHALATION_SPRAY | Freq: Four times a day (QID) | RESPIRATORY_TRACT | 0 refills | Status: DC | PRN

## 2019-04-01 MED ORDER — BENZONATATE 100 MG PO CAPS: 100.0000 mg | ORAL_CAPSULE | Freq: Three times a day (TID) | ORAL | 0 refills | Status: DC

## 2019-04-01 NOTE — ED Notes (Signed)
Patient verbalizes understanding of discharge instructions. Opportunity for questioning and answers were provided. Armband removed by staff, pt discharged from ED. Prescriptions and follow up care reviewed. Pt ambulatory to lobby.  

## 2019-04-01 NOTE — ED Triage Notes (Signed)
Pt endorses cough x 2 weeks, denies exposure. Went to her pcp 2 weeks ago for same sx and fever. Afebrile today. VSS

## 2019-04-01 NOTE — ED Provider Notes (Signed)
[facility] EMERGENCY DEPARTMENT Provider Note   CSN: [id number] Arrival date & time: 04/01/19  1751    History   Chief Complaint Chief Complaint  Patient presents with  . Cough    HPI Ariana Rogers is a 33 y.o. female.     HPI   33 year old female presents today with complaints of cough.  She notes approximately 2 weeks ago she felt feverish and developed a cough.  She notes that the fever only lasted several days and has completely resolved.  She notes continued productive cough.  She notes that when she has coughing fits she gets short of breath, but has no significant shortness of breath at rest.  She denies any other infectious symptoms.  She denies any close COVID contacts or recent travel.  She reports that she does smoke but denies any chronic health conditions.  She notes taking Mucinex without improvement in her symptoms.  History reviewed. No pertinent past medical history.  Patient Active Problem List   Diagnosis Date Noted  . Breast mass, left 01/28/2019  . Painful lumpy left breast 01/28/2019  . Maculopapular rash, generalized 12/30/2018  . Pityriasis rosea 12/30/2018  . Class 3 severe obesity due to excess calories without serious comorbidity with body mass index (BMI) of 40.0 to 44.9 in adult [facility]) 12/30/2018  . Itching 12/30/2018  . Leukocytosis 01/17/2018  . Prediabetes 01/17/2018    Past Surgical History:  Procedure Laterality Date  . DENTAL SURGERY    . TUBAL LIGATION  2 years ago     OB History    Gravida  4   Para      Term      Preterm      AB      Living  4     SAB      TAB      Ectopic      Multiple      Live Births  4            Home Medications    Prior to Admission medications   Medication Sig Start Date End Date Taking? Authorizing Provider  albuterol (VENTOLIN HFA) 108 (90 Base) MCG/ACT inhaler Inhale 1-2 puffs into the lungs every 6 (six) hours as needed for wheezing or shortness of breath.  04/01/19   Shail Urbas, Tinnie Gens, PA-C  benzonatate (TESSALON) 100 MG capsule Take 1 capsule (100 mg total) by mouth every 8 (eight) hours. 04/01/19   Patirica Longshore, Tinnie Gens, PA-C  Calcium Carbonate-Vitamin D (CALCIUM 600+D HIGH POTENCY PO) Take 1 capsule by mouth daily.    [provider]  cetirizine (ZYRTEC) 10 MG tablet Take 1 tablet (10 mg total) by mouth daily. Patient not taking: Reported on 03/18/2019 12/26/18   Antony Madura, PA-C  fluticasone [facility]) 50 MCG/ACT nasal spray Place 2 sprays into both nostrils daily. Patient not taking: Reported on 03/18/2019 07/18/18   Mike Gip, FNP  vitamin B-12 (CYANOCOBALAMIN) 1000 MCG tablet Take 1,000 mcg by mouth daily.    [provider]    Family History Family History  Problem Relation Age of Onset  . Hypertension Mother   . Arthritis Mother   . Fibromyalgia Mother   . Other Father        no health concern    Social History Social History   Tobacco Use  . Smoking status: Current Every Day Smoker    Packs/day: 0.50    Types: Cigarettes  . Smokeless tobacco: Never Used  Substance Use Topics  .  Alcohol use: Yes    Comment: occasionally  . Drug use: No     Allergies   Patient has no known allergies.   Review of Systems Review of Systems  All other systems reviewed and are negative.   Physical Exam Updated Vital Signs BP (!) 129/94 (BP Location: Right Arm)   Pulse 72   Temp 98.8 F (37.1 C) (Oral)   Resp 18   LMP 03/18/2019 (Approximate)   SpO2 100%   Physical Exam Vitals signs and nursing note reviewed.  Constitutional:      Appearance: She is well-developed.  HENT:     Head: Normocephalic and atraumatic.  Eyes:     General: No scleral icterus.       Right eye: No discharge.        Left eye: No discharge.     Conjunctiva/sclera: Conjunctivae normal.     Pupils: Pupils are equal, round, and reactive to light.  Neck:     Musculoskeletal: Normal range of motion.     Vascular: No JVD.     Trachea: No  tracheal deviation.  Pulmonary:     Effort: Pulmonary effort is normal. No respiratory distress.     Breath sounds: Normal breath sounds. No stridor. No wheezing, rhonchi or rales.  Neurological:     Mental Status: She is alert and oriented to person, place, and time.     Coordination: Coordination normal.  Psychiatric:        Behavior: Behavior normal.        Thought Content: Thought content normal.        Judgment: Judgment normal.      ED Treatments / Results  Labs (all labs ordered are listed, but only abnormal results are displayed) Labs Reviewed - No data to display  EKG None  Radiology Dg Chest 2 View  Result Date: 04/01/2019 CLINICAL DATA:  Cough for the past 2 weeks. Smoker. EXAM: CHEST - 2 VIEW COMPARISON:  Thoracic spine radiographs dated 10/25/2013. FINDINGS: Poor inspiration. Grossly normal sized heart. Clear lungs. Mild diffuse peribronchial thickening. Unremarkable bones. IMPRESSION: Mild bronchitic changes. Electronically Signed   By: Beckie SaltsSteven  Reid M.D.   On: 04/01/2019 19:58    Procedures Procedures (including critical care time)  Medications Ordered in ED Medications - No data to display   Initial Impression / Assessment and Plan / ED Course  I have reviewed the triage vital signs and the nursing notes.  Pertinent labs & imaging results that were available during my care of the patient were reviewed by me and considered in my medical decision making (see chart for details).        33 year old female presents today with likely bronchitis.  She is very well-appearing no acute distress clear lung sounds reassuring chest x-ray.  No signs of bacterial etiology, low suspicion for COVID.  Patient discharged symptomatic care strict return precautions.  She verbalized understanding and agreement to today's plan.  Final Clinical Impressions(s) / ED Diagnoses   Final diagnoses:  Bronchitis    ED Discharge Orders         Ordered    albuterol (VENTOLIN HFA)  108 (90 Base) MCG/ACT inhaler  Every 6 hours PRN     04/01/19 2037    benzonatate (TESSALON) 100 MG capsule  Every 8 hours     04/01/19 2037           Eyvonne MechanicHedges, Shadawn Hanaway, PA-C 04/01/19 2046    Wynetta FinesMessick, Peter C, MD 04/02/19 [phone]

## 2019-04-01 NOTE — Discharge Instructions (Addendum)
Please read attached information. If you experience any new or worsening signs or symptoms please return to the emergency room for evaluation. Please follow-up with your primary care provider or specialist as discussed. Please use medication prescribed only as directed and discontinue taking if you have any concerning signs or symptoms.   °

## 2019-04-09 ENCOUNTER — Ambulatory Visit (INDEPENDENT_AMBULATORY_CARE_PROVIDER_SITE_OTHER): Payer: Self-pay | Admitting: Family Medicine

## 2019-04-09 ENCOUNTER — Other Ambulatory Visit: Payer: Self-pay

## 2019-04-09 ENCOUNTER — Encounter: Payer: Self-pay | Admitting: Family Medicine

## 2019-04-09 DIAGNOSIS — N6320 Unspecified lump in the left breast, unspecified quadrant: Secondary | ICD-10-CM

## 2019-04-09 DIAGNOSIS — R059 Cough, unspecified: Secondary | ICD-10-CM

## 2019-04-09 DIAGNOSIS — J209 Acute bronchitis, unspecified: Secondary | ICD-10-CM | POA: Insufficient documentation

## 2019-04-09 DIAGNOSIS — R05 Cough: Secondary | ICD-10-CM

## 2019-04-09 DIAGNOSIS — Z09 Encounter for follow-up examination after completed treatment for conditions other than malignant neoplasm: Secondary | ICD-10-CM

## 2019-04-09 MED ORDER — HYDROCODONE-HOMATROPINE 5-1.5 MG/5ML PO SYRP: 5.0000 mL | ORAL_SOLUTION | Freq: Three times a day (TID) | ORAL | 0 refills | Status: DC | PRN

## 2019-04-09 MED FILL — HYDROCODONE-HOMATROPINE SOL: 5-1.5 | 8 days supply | Qty: 120 | Fill #0

## 2019-04-09 NOTE — Progress Notes (Signed)
Virtual Visit via Telephone Note  I connected with Ariana Rogers on 04/10/19 at  8:20 AM EDT by telephone and verified that I am speaking with the correct person using two identifiers.   I discussed the limitations, risks, security and privacy concerns of performing an evaluation and management service by telephone and the availability of in person appointments. I also discussed with the patient that there may be a patient responsible charge related to this service. The patient expressed understanding and agreed to proceed.   History of Present Illness:  Past Medical History:  Diagnosis Date  . Acute bronchitis   . Left breast mass   . Smoker       Current Outpatient Medications on File Prior to Visit  Medication Sig Dispense Refill  . albuterol (VENTOLIN HFA) 108 (90 Base) MCG/ACT inhaler Inhale 1-2 puffs into the lungs every 6 (six) hours as needed for wheezing or shortness of breath. 1 Inhaler 0  . benzonatate (TESSALON) 100 MG capsule Take 1 capsule (100 mg total) by mouth every 8 (eight) hours. 21 capsule 0  . Calcium Carbonate-Vitamin D (CALCIUM 600+D HIGH POTENCY PO) Take 1 capsule by mouth daily.    . vitamin B-12 (CYANOCOBALAMIN) 1000 MCG tablet Take 1,000 mcg by mouth daily.    . cetirizine (ZYRTEC) 10 MG tablet Take 1 tablet (10 mg total) by mouth daily. (Patient not taking: Reported on 04/09/2019) 30 tablet 0  . fluticasone (FLONASE) 50 MCG/ACT nasal spray Place 2 sprays into both nostrils daily. (Patient not taking: Reported on 04/09/2019) 16 g 6   No current facility-administered medications on file prior to visit.     Current Status: Since her last ED visit on 04/01/2019 for Acute Bronchitis. She continues to have cough that is not improving. She was recently in our office for Seasonal Allergies. She has not been taking antihistamine and nasal corticosteroid as prescribed. She is doing well with no complaints. No chest pain, heart palpitations, and shortness of breath  reported.  She denies fevers, chills, fatigue, recent infections, weight loss, and night sweats. She has not had any headaches, visual changes, dizziness, and falls.  No reports of GI problems such as nausea, vomiting, diarrhea, and constipation. She has no reports of blood in stools, dysuria and hematuria. No depression or anxiety reported. She denies pain today.   Observations/Objective:  Telephone Virtual Visit   Assessment and Plan:  1. Hospital discharge follow-up  2. Acute bronchitis, unspecified organism We will treat her cough with Hycodan. She is advised to take Antihistamine and use nasal corticosteroid daily as prescribed. We will have her follow up in 1 week to re-assess.  - HYDROcodone-homatropine (HYCODAN) 5-1.5 MG/5ML syrup; Take 5 mLs by mouth every 8 (eight) hours as needed for cough.  Dispense: 120 mL; Refill: 0  3. Cough Tessalon Perles are ineffective. We will initiate Hycodan  - HYDROcodone-homatropine (HYCODAN) 5-1.5 MG/5ML syrup; Take 5 mLs by mouth every 8 (eight) hours as needed for cough.  Dispense: 120 mL; Refill: 0  4. Breast mass, left She is currently in the [facility] program and receiving treatment.   Meds ordered this encounter  Medications  . HYDROcodone-homatropine (HYCODAN) 5-1.5 MG/5ML syrup    Sig: Take 5 mLs by mouth every 8 (eight) hours as needed for cough.    Dispense:  120 mL    Refill:  0    No orders of the defined types were placed in this encounter.   Referral Orders  No referral(s) requested today  Raliegh IpNatalie Shanoah Asbill,  MSN, FNP-BC [facility] [address]  [place], [place] [id number] [phone]    Follow Up Instructions:  She will follow up in 1 week.     I discussed the assessment and treatment plan with the patient. The patient was provided an opportunity to ask questions and all were answered. The patient agreed with the plan and demonstrated an understanding of the instructions.    The patient was advised to call back or seek an in-person evaluation if the symptoms worsen or if the condition fails to improve as anticipated.  I provided 20 minutes of non-face-to-face time during this encounter.   Kallie LocksNatalie M Circe Chilton, FNP

## 2019-04-10 ENCOUNTER — Encounter: Payer: Self-pay | Admitting: Family Medicine

## 2019-04-17 ENCOUNTER — Telehealth: Payer: Self-pay

## 2019-04-17 NOTE — Telephone Encounter (Signed)
Left a vm for patient to callback to do the screening 

## 2019-04-18 ENCOUNTER — Ambulatory Visit: Payer: No Typology Code available for payment source | Admitting: Family Medicine

## 2019-04-21 ENCOUNTER — Encounter: Payer: Self-pay | Admitting: Family Medicine

## 2019-04-21 ENCOUNTER — Other Ambulatory Visit: Payer: Self-pay

## 2019-04-21 ENCOUNTER — Ambulatory Visit (INDEPENDENT_AMBULATORY_CARE_PROVIDER_SITE_OTHER): Payer: Self-pay | Admitting: Family Medicine

## 2019-04-21 VITALS — BP 132/80 | HR 80 | Temp 98.2°F | Ht 65.75 in | Wt 263.0 lb

## 2019-04-21 DIAGNOSIS — Z0000 Encounter for general adult medical examination without abnormal findings: Secondary | ICD-10-CM

## 2019-04-21 DIAGNOSIS — Z6841 Body Mass Index (BMI) 40.0 and over, adult: Secondary | ICD-10-CM

## 2019-04-21 DIAGNOSIS — E66813 Obesity, class 3: Secondary | ICD-10-CM

## 2019-04-21 DIAGNOSIS — R05 Cough: Secondary | ICD-10-CM

## 2019-04-21 DIAGNOSIS — Z09 Encounter for follow-up examination after completed treatment for conditions other than malignant neoplasm: Secondary | ICD-10-CM

## 2019-04-21 DIAGNOSIS — K219 Gastro-esophageal reflux disease without esophagitis: Secondary | ICD-10-CM | POA: Insufficient documentation

## 2019-04-21 DIAGNOSIS — R059 Cough, unspecified: Secondary | ICD-10-CM | POA: Insufficient documentation

## 2019-04-21 NOTE — Patient Instructions (Addendum)
Omeprazole tablets (OTC) What is this medicine? OMEPRAZOLE (oh ME pray zol) prevents the production of acid in the stomach. It is used to treat the symptoms of heartburn. You can buy this medicine without a prescription. This product is not for long-term use, unless otherwise directed by your doctor or health care professional. This medicine may be used for other purposes; ask your health care provider or pharmacist if you have questions. COMMON BRAND NAME(S): Prilosec OTC What should I tell my health care provider before I take this medicine? They need to know if you have any of these conditions: -black or bloody stools -chest pain -difficulty swallowing -have had heartburn for over 3 months -have heartburn with dizziness, lightheadedness or sweating -liver disease -lupus -stomach pain -unexplained weight loss -vomiting with blood -wheezing -an unusual or allergic reaction to omeprazole, other medicines, foods, dyes, or preservatives -pregnant or trying to get pregnant -breast-feeding How should I use this medicine? Take this medicine by mouth. Follow the directions on the product label. If you are taking this medicine without a prescription, take one tablet every day. Do not use for longer than 14 days or repeat a course of treatment more often than every 4 months unless directed by a doctor or healthcare professional. Take your dose at regular intervals every 24 hours. Swallow the tablet whole with a drink of water. Do not crush, break or chew. This medicine works best if taken on an empty stomach 30 minutes before breakfast. If you are using this medicine with the prescription of your doctor or healthcare professional, follow the directions you were given. Do not take your medicine more often than directed. Talk to your pediatrician regarding the use of this medicine in children. Special care may be needed. Overdosage: If you think you have taken too much of this medicine contact a poison  control center or emergency room at once. NOTE: This medicine is only for you. Do not share this medicine with others. What if I miss a dose? If you miss a dose, take it as soon as you can. If it is almost time for your next dose, take only that dose. Do not take double or extra doses. What may interact with this medicine? Do not take this medicine with any of the following medications: -atazanavir -clopidogrel -nelfinavir This medicine may also interact with the following medications: -ampicillin -certain medicines for anxiety or sleep -certain medicines that treat or prevent blood clots like warfarin -cyclosporine -diazepam -digoxin -disulfiram -iron salts -methotrexate -mycophenolate mofetil -phenytoin -prescription medicine for fungal or yeast infection like itraconazole, ketoconazole, voriconazole -saquinavir -tacrolimus This list may not describe all possible interactions. Give your health care provider a list of all the medicines, herbs, non-prescription drugs, or dietary supplements you use. Also tell them if you smoke, drink alcohol, or use illegal drugs. Some items may interact with your medicine. What should I watch for while using this medicine? It can take several days before your heartburn gets better. Check with your doctor or health care professional if your condition does not start to get better, or if it gets worse. Do not treat diarrhea with over the counter products. Contact your doctor if you have diarrhea that lasts more than 2 days or if it is severe and watery. Do not treat yourself for heartburn with this medicine for more than 14 days in a row. You should only use this medicine for a 2-week treatment period once every 4 months. If your symptoms return shortly after your   therapy is complete, or within the 4 month time frame, call your doctor or health care professional. This medicine may cause a decrease in vitamin B12. You should make sure that you get enough  vitamin B12 while you are taking this medicine. Discuss the foods you eat and the vitamins you take with your health care professional. What side effects may I notice from receiving this medicine? Side effects that you should report to your doctor or health care professional as soon as possible: - allergic reactions like skin rash, itching or hives, swelling of the face, lips, or tongue - bone, muscle or joint pain - breathing problems - chest pain or chest tightness - dark yellow or brown urine - diarrhea - dizziness - fast, irregular heartbeat - feeling faint or lightheaded - fever or sore throat - muscle spasm - palpitations - rash on cheeks or arms that gets worse in the sun - redness, blistering, peeling or loosening of the skin, including inside the mouth - seizures -stomach polyps - tremors - unusual bleeding or bruising - unusually weak or tired - yellowing of the eyes or skin Side effects that usually do not require medical attention (report to your doctor or health care professional if they continue or are bothersome): - constipation - dry mouth - headache - loose stools - nausea This list may not describe all possible side effects. Call your doctor for medical advice about side effects. You may report side effects to FDA at 1-800-FDA-1088. Where should I keep my medicine? Keep out of the reach of children. Store at room temperature between 20 and 25 degrees C (68 and 77 degrees F). Protect from light and moisture. Throw away any unused medicine after the expiration date. NOTE: This sheet is a summary. It may not cover all possible information. If you have questions about this medicine, talk to your doctor, pharmacist, or health care provider.  2019 Elsevier/Gold Standard (2017-06-01 13:29:07)  Gastroesophageal Reflux Disease, Adult Gastroesophageal reflux (GER) happens when acid from the stomach flows up into the tube that connects the mouth  and the stomach (esophagus). Normally, food travels down the esophagus and stays in the stomach to be digested. With GER, food and stomach acid sometimes move back up into the esophagus. You may have a disease called gastroesophageal reflux disease (GERD) if the reflux:  Happens often.  Causes frequent or very bad symptoms.  Causes problems such as damage to the esophagus. When this happens, the esophagus becomes sore and swollen (inflamed). Over time, GERD can make small holes (ulcers) in the lining of the esophagus. What are the causes? This condition is caused by a problem with the muscle between the esophagus and the stomach. When this muscle is weak or not normal, it does not close properly to keep food and acid from coming back up from the stomach. The muscle can be weak because of:  Tobacco use.  Pregnancy.  Having a certain type of hernia (hiatal hernia).  Alcohol use.  Certain foods and drinks, such as coffee, chocolate, onions, and peppermint. What increases the risk? You are more likely to develop this condition if you:  Are overweight.  Have a disease that affects your connective tissue.  Use NSAID medicines. What are the signs or symptoms? Symptoms of this condition include:  Heartburn.  Difficult or painful swallowing.  The feeling of having a lump in the throat.  A bitter taste in the mouth.  Bad breath.  Having a lot of saliva.  Having  an upset or bloated stomach.  Belching.  Chest pain. Different conditions can cause chest pain. Make sure you see your doctor if you have chest pain.  Shortness of breath or noisy breathing (wheezing).  Ongoing (chronic) cough or a cough at night.  Wearing away of the surface of teeth (tooth enamel).  Weight loss. How is this treated? Treatment will depend on how bad your symptoms are. Your doctor may suggest:  Changes to your diet.  Medicine.  Surgery. Follow these instructions at home: Eating and  drinking   Follow a diet as told by your doctor. You may need to avoid foods and drinks such as: ? Coffee and tea (with or without caffeine). ? Drinks that contain alcohol. ? Energy drinks and sports drinks. ? Bubbly (carbonated) drinks or sodas. ? Chocolate and cocoa. ? Peppermint and mint flavorings. ? Garlic and onions. ? Horseradish. ? Spicy and acidic foods. These include peppers, chili powder, curry powder, vinegar, hot sauces, and BBQ sauce. ? Citrus fruit juices and citrus fruits, such as oranges, lemons, and limes. ? Tomato-based foods. These include red sauce, chili, salsa, and pizza with red sauce. ? Fried and fatty foods. These include donuts, french fries, potato chips, and high-fat dressings. ? High-fat meats. These include hot dogs, rib eye steak, sausage, ham, and bacon. ? High-fat dairy items, such as whole milk, butter, and cream cheese.  Eat small meals often. Avoid eating large meals.  Avoid drinking large amounts of liquid with your meals.  Avoid eating meals during the 2-3 hours before bedtime.  Avoid lying down right after you eat.  Do not exercise right after you eat. Lifestyle   Do not use any products that contain nicotine or tobacco. These include cigarettes, e-cigarettes, and chewing tobacco. If you need help quitting, ask your doctor.  Try to lower your stress. If you need help doing this, ask your doctor.  If you are overweight, lose an amount of weight that is healthy for you. Ask your doctor about a safe weight loss goal. General instructions  Pay attention to any changes in your symptoms.  Take over-the-counter and prescription medicines only as told by your doctor. Do not take aspirin, ibuprofen, or other NSAIDs unless your doctor says it is okay.  Wear loose clothes. Do not wear anything tight around your waist.  Raise (elevate) the head of your bed about 6 inches (15 cm).  Avoid bending over if this makes your symptoms worse.  Keep  all follow-up visits as told by your doctor. This is important. Contact a doctor if:  You have new symptoms.  You lose weight and you do not know why.  You have trouble swallowing or it hurts to swallow.  You have wheezing or a cough that keeps happening.  Your symptoms do not get better with treatment.  You have a hoarse voice. Get help right away if:  You have pain in your arms, neck, jaw, teeth, or back.  You feel sweaty, dizzy, or light-headed.  You have chest pain or shortness of breath.  You throw up (vomit) and your throw-up looks like blood or coffee grounds.  You pass out (faint).  Your poop (stool) is bloody or black.  You cannot swallow, drink, or eat. Summary  If a person has gastroesophageal reflux disease (GERD), food and stomach acid move back up into the esophagus and cause symptoms or problems such as damage to the esophagus.  Treatment will depend on how bad your symptoms are.  Follow a diet as told by your doctor.  Take all medicines only as told by your doctor. This information is not intended to replace advice given to you by your health care provider. Make sure you discuss any questions you have with your health care provider. Document Released: 04/03/2008 Document Revised: 04/24/2018 Document Reviewed: 04/24/2018 Elsevier Interactive Patient Education  2019 [employer]

## 2019-04-21 NOTE — Progress Notes (Signed)
[facility]   Established Patient Office Visit  Subjective:  Patient ID: Ariana Rogers, female    DOB: 1986/09/24  Age: 33 y.o. MRN: [id number]  CC:  Chief Complaint  Patient presents with  . Follow-up    1 week on cough    HPI Ariana Rogers is a 33 year old female who presents for follow up today.   Past Medical History:  Diagnosis Date  . Acute bronchitis   . Left breast mass   . Smoker    Current Status: Since her last office visit, she is doing well with no complaints.    She states that her cough has improved. She takes OTC Omeprazole on occasion for reflux. She continues Hycodan as needed.   She denies fevers, chills, fatigue, recent infections, weight loss, and night sweats. She has not had any headaches, visual changes, dizziness, and falls. No chest pain, heart palpitations, cough and shortness of breath reported. No reports of GI problems such as nausea, vomiting, diarrhea, and constipation. She has no reports of blood in stools, dysuria and hematuria. No depression or anxiety reported. She denies pain today.   Past Surgical History:  Procedure Laterality Date  . DENTAL SURGERY    . TUBAL LIGATION  2 years ago    Family History  Problem Relation Age of Onset  . Hypertension Mother   . Arthritis Mother   . Fibromyalgia Mother   . Other Father        no health concern    Social History   Socioeconomic History  . Marital status: Single    Spouse name: Not on file  . Number of children: 4  . Years of education: Not on file  . Highest education level: Some college, no degree  Occupational History  . Not on file  Social Needs  . Financial resource strain: Not on file  . Food insecurity    Worry: Not on file    Inability: Not on file  . Transportation needs    Medical: No    Non-medical: No  Tobacco Use  . Smoking status: Current Every Day Smoker    Packs/day: 0.50    Types: Cigarettes  . Smokeless  tobacco: Never Used  Substance and Sexual Activity  . Alcohol use: Yes    Comment: occasionally  . Drug use: No  . Sexual activity: Yes    Birth control/protection: Surgical  Lifestyle  . Physical activity    Days per week: Not on file    Minutes per session: Not on file  . Stress: Not on file  Relationships  . Social Musician on phone: Not on file    Gets together: Not on file    Attends religious service: Not on file    Active member of club or organization: Not on file    Attends meetings of clubs or organizations: Not on file    Relationship status: Not on file  . Intimate partner violence    Fear of current or ex partner: Not on file    Emotionally abused: Not on file    Physically abused: Not on file    Forced sexual activity: Not on file  Other Topics Concern  . Not on file  Social History Narrative  . Not on file    Outpatient Medications Prior to Visit  Medication Sig Dispense Refill  . cetirizine (ZYRTEC) 10 MG tablet Take 1 tablet (10 mg total)  by mouth daily. 30 tablet 0  . fluticasone (FLONASE) 50 MCG/ACT nasal spray Place 2 sprays into both nostrils daily. 16 g 6  . HYDROcodone-homatropine (HYCODAN) 5-1.5 MG/5ML syrup Take 5 mLs by mouth every 8 (eight) hours as needed for cough. 120 mL 0  . omeprazole (PRILOSEC) 20 MG capsule Take 20 mg by mouth daily.    . vitamin B-12 (CYANOCOBALAMIN) 1000 MCG tablet Take 1,000 mcg by mouth daily.    Marland Kitchen albuterol (VENTOLIN HFA) 108 (90 Base) MCG/ACT inhaler Inhale 1-2 puffs into the lungs every 6 (six) hours as needed for wheezing or shortness of breath. 1 Inhaler 0  . Calcium Carbonate-Vitamin D (CALCIUM 600+D HIGH POTENCY PO) Take 1 capsule by mouth daily.    . benzonatate (TESSALON) 100 MG capsule Take 1 capsule (100 mg total) by mouth every 8 (eight) hours. (Patient not taking: Reported on 04/21/2019) 21 capsule 0   No facility-administered medications prior to visit.     No Known Allergies  ROS Review of  Systems  Constitutional: Negative.   HENT: Negative.   Eyes: Negative.   Respiratory: Positive for cough (Occasional ).   Cardiovascular: Negative.   Gastrointestinal: Positive for abdominal distention (obese).  Endocrine: Negative.   Genitourinary: Negative.   Musculoskeletal: Negative.   Skin: Negative.   Allergic/Immunologic: Negative.   Neurological: Negative.   Hematological: Negative.   Psychiatric/Behavioral: Negative.       Objective:    Physical Exam  Constitutional: She is oriented to person, place, and time. She appears well-developed and well-nourished.  HENT:  Head: Normocephalic and atraumatic.  Eyes: Conjunctivae are normal.  Neck: Normal range of motion. Neck supple.  Cardiovascular: Normal rate, regular rhythm, normal heart sounds and intact distal pulses.  Pulmonary/Chest: Effort normal and breath sounds normal.  Abdominal: Soft. Bowel sounds are normal.  Musculoskeletal: Normal range of motion.  Neurological: She is alert and oriented to person, place, and time.  Skin: Skin is warm and dry.  Psychiatric: She has a normal mood and affect. Her behavior is normal. Judgment and thought content normal.  Nursing note and vitals reviewed.   BP 132/80 (BP Location: Left Arm, Patient Position: Sitting, Cuff Size: Large)   Pulse 80   Temp 98.2 F (36.8 C) (Oral)   Ht 5' 5.75" (1.67 m)   Wt 263 lb (119.3 kg)   LMP 04/16/2019   SpO2 100%   BMI 42.77 kg/m  Wt Readings from Last 3 Encounters:  04/21/19 263 lb (119.3 kg)  03/18/19 274 lb (124.3 kg)  02/11/19 272 lb (123.4 kg)     Health Maintenance Due  Topic Date Due  . HIV Screening  10/27/2000    There are no preventive care reminders to display for this patient.  Lab Results  Component Value Date   TSH 0.978 01/15/2018   Lab Results  Component Value Date   WBC 12.1 (H) 01/15/2018   HGB 14.3 01/15/2018   HCT 43.1 01/15/2018   MCV 87 01/15/2018   PLT 244 01/15/2018   Lab Results   Component Value Date   NA 143 01/15/2018   K 4.3 01/15/2018   CO2 20 01/15/2018   GLUCOSE 91 01/15/2018   BUN 12 01/15/2018   CREATININE 0.88 01/15/2018   BILITOT 0.3 01/15/2018   ALKPHOS 55 01/15/2018   AST 14 01/15/2018   ALT 18 01/15/2018   PROT 6.4 01/15/2018   ALBUMIN 4.0 01/15/2018   CALCIUM 8.6 (L) 01/15/2018   ANIONGAP 7 05/27/2017   No  results found for: CHOL No results found for: HDL No results found for: LDLCALC No results found for: TRIG No results found for: [facility] Lab Results  Component Value Date   HGBA1C 5.9 01/15/2018      Assessment & Plan:   1. Cough Much improved. She will continue Hycodan QHS as needed.  2. Gastroesophageal reflux disease without esophagitis She will began to take OTC Omeprazole daily.    3. Healthcare maintenance - CBC with Differential - Comprehensive metabolic panel - TSH - Lipid Panel - Vitamin D, 25-hydroxy - Vitamin B12  4. Class 3 severe obesity due to excess calories without serious comorbidity with body mass index (BMI) of 40.0 to 44.9 in adult [facility]) Body mass index is 42.77 kg/m. Goal BMI  is <30. Encouraged efforts to reduce weight include engaging in physical activity as tolerated with goal of 150 minutes per week. Improve dietary choices and eat a meal regimen consistent with a Mediterranean or DASH diet. Reduce simple carbohydrates. Do not skip meals and eat healthy snacks throughout the day to avoid over-eating at dinner. Set a goal weight loss that is achievable for you.  5. Follow up She will follow up in 3 months.   No orders of the defined types were placed in this encounter.   Orders Placed This Encounter  Procedures  . CBC with Differential  . Comprehensive metabolic panel  . TSH  . Lipid Panel  . Vitamin D, 25-hydroxy  . Vitamin B12    Referral Orders  No referral(s) requested today    Raliegh Ip,  MSN, FNP-BC [facility] [address] [place], [place] [address] [phone]  Problem List Items Addressed This Visit      Other   Class 3 severe obesity due to excess calories without serious comorbidity with body mass index (BMI) of 40.0 to 44.9 in adult [facility])    Other Visit Diagnoses    Cough    -  Primary   Gastroesophageal reflux disease without esophagitis       Relevant Medications   omeprazole (PRILOSEC) 20 MG capsule   Healthcare maintenance       Relevant Orders   CBC with Differential   Comprehensive metabolic panel   TSH   Lipid Panel   Vitamin D, 25-hydroxy   Vitamin B12   Follow up          No orders of the defined types were placed in this encounter.   Follow-up: Return in about 3 months (around 07/22/2019).    Kallie Locks, FNP

## 2019-04-22 ENCOUNTER — Other Ambulatory Visit: Payer: Self-pay | Admitting: Family Medicine

## 2019-04-22 DIAGNOSIS — E559 Vitamin D deficiency, unspecified: Secondary | ICD-10-CM

## 2019-04-22 LAB — CBC WITH DIFFERENTIAL/PLATELET
Basophils Absolute: 0.1 10*3/uL (ref 0.0–0.2)
Basos: 1 %
EOS (ABSOLUTE): 0.3 10*3/uL (ref 0.0–0.4)
Eos: 2 %
Hematocrit: 40.9 % (ref 34.0–46.6)
Hemoglobin: 13.8 g/dL (ref 11.1–15.9)
Immature Grans (Abs): 0 10*3/uL (ref 0.0–0.1)
Immature Granulocytes: 0 %
Lymphocytes Absolute: 4.4 10*3/uL — ABNORMAL HIGH (ref 0.7–3.1)
Lymphs: 40 %
MCH: 27.6 pg (ref 26.6–33.0)
MCHC: 33.7 g/dL (ref 31.5–35.7)
MCV: 82 fL (ref 79–97)
Monocytes Absolute: 0.6 10*3/uL (ref 0.1–0.9)
Monocytes: 5 %
Neutrophils Absolute: 5.6 10*3/uL (ref 1.4–7.0)
Neutrophils: 52 %
Platelets: 314 10*3/uL (ref 150–450)
RBC: 5 x10E6/uL (ref 3.77–5.28)
RDW: 13.5 % (ref 11.7–15.4)
WBC: 10.9 10*3/uL — ABNORMAL HIGH (ref 3.4–10.8)

## 2019-04-22 LAB — COMPREHENSIVE METABOLIC PANEL
ALT: 16 IU/L (ref 0–32)
AST: 17 IU/L (ref 0–40)
Albumin/Globulin Ratio: 1.9 (ref 1.2–2.2)
Albumin: 4.1 g/dL (ref 3.8–4.8)
Alkaline Phosphatase: 65 IU/L (ref 39–117)
BUN/Creatinine Ratio: 11 (ref 9–23)
BUN: 10 mg/dL (ref 6–20)
Bilirubin Total: 0.7 mg/dL (ref 0.0–1.2)
CO2: 20 mmol/L (ref 20–29)
Calcium: 8.9 mg/dL (ref 8.7–10.2)
Chloride: 108 mmol/L — ABNORMAL HIGH (ref 96–106)
Creatinine, Ser: 0.91 mg/dL (ref 0.57–1.00)
GFR calc Af Amer: 96 mL/min/{1.73_m2} (ref >59)
GFR calc non Af Amer: 83 mL/min/{1.73_m2} (ref >59)
Globulin, Total: 2.2 g/dL (ref 1.5–4.5)
Glucose: 108 mg/dL — ABNORMAL HIGH (ref 65–99)
Potassium: 4 mmol/L (ref 3.5–5.2)
Sodium: 140 mmol/L (ref 134–144)
Total Protein: 6.3 g/dL (ref 6.0–8.5)

## 2019-04-22 LAB — LIPID PANEL
Chol/HDL Ratio: 4 ratio (ref 0.0–4.4)
Cholesterol, Total: 155 mg/dL (ref 100–199)
HDL: 39 mg/dL — ABNORMAL LOW (ref >39)
LDL Calculated: 98 mg/dL (ref 0–99)
Triglycerides: 91 mg/dL (ref 0–149)
VLDL Cholesterol Cal: 18 mg/dL (ref 5–40)

## 2019-04-22 LAB — VITAMIN B12: Vitamin B-12: 343 pg/mL (ref 232–1245)

## 2019-04-22 LAB — VITAMIN D 25 HYDROXY (VIT D DEFICIENCY, FRACTURES): Vit D, 25-Hydroxy: 12.1 ng/mL — ABNORMAL LOW (ref 30.0–100.0)

## 2019-04-22 LAB — TSH: TSH: 1.17 u[IU]/mL (ref 0.450–4.500)

## 2019-04-22 MED ORDER — VITAMIN D (ERGOCALCIFEROL) 1.25 MG (50000 UNIT) PO CAPS: 50000.0000 [IU] | ORAL_CAPSULE | ORAL | 3 refills | Status: DC

## 2019-04-22 MED FILL — VIT D2 1.25 MG (50,000 UNIT: 1.25 MG | 28 days supply | Qty: 4 | Fill #0

## 2019-04-23 ENCOUNTER — Telehealth: Payer: Self-pay

## 2019-04-23 NOTE — Telephone Encounter (Signed)
Patient needs a note stating that it's okay for her to return to work. Patient is aware that you are out of office until Friday.

## 2019-04-25 ENCOUNTER — Other Ambulatory Visit: Payer: Self-pay | Admitting: Family Medicine

## 2019-04-25 ENCOUNTER — Telehealth: Payer: Self-pay

## 2019-04-25 ENCOUNTER — Other Ambulatory Visit: Payer: Self-pay | Admitting: Internal Medicine

## 2019-04-25 NOTE — Telephone Encounter (Signed)
Duplicate

## 2019-04-25 NOTE — Telephone Encounter (Signed)
Patient called again regarding letter to return back to work. I express to patient that provider just return to office today and that she will take a look at message when she can in between patient. Patient understood.

## 2019-04-30 LAB — NOVEL CORONAVIRUS, NAA: SARS-CoV-2, NAA: NOT DETECTED

## 2019-07-22 ENCOUNTER — Ambulatory Visit: Payer: No Typology Code available for payment source | Admitting: Family Medicine

## 2019-09-19 ENCOUNTER — Ambulatory Visit: Payer: Self-pay | Admitting: Family Medicine

## 2019-10-22 ENCOUNTER — Ambulatory Visit: Payer: HRSA Program | Attending: Internal Medicine

## 2019-10-22 DIAGNOSIS — Z20822 Contact with and (suspected) exposure to covid-19: Secondary | ICD-10-CM

## 2019-10-22 DIAGNOSIS — Z20828 Contact with and (suspected) exposure to other viral communicable diseases: Secondary | ICD-10-CM | POA: Insufficient documentation

## 2019-10-24 LAB — NOVEL CORONAVIRUS, NAA: SARS-CoV-2, NAA: NOT DETECTED

## 2019-11-18 ENCOUNTER — Ambulatory Visit: Payer: Self-pay | Attending: Internal Medicine

## 2019-11-18 DIAGNOSIS — Z20822 Contact with and (suspected) exposure to covid-19: Secondary | ICD-10-CM | POA: Insufficient documentation

## 2019-11-19 LAB — NOVEL CORONAVIRUS, NAA: SARS-CoV-2, NAA: NOT DETECTED

## 2019-12-01 ENCOUNTER — Ambulatory Visit: Payer: No Typology Code available for payment source | Attending: Internal Medicine

## 2019-12-01 DIAGNOSIS — Z20822 Contact with and (suspected) exposure to covid-19: Secondary | ICD-10-CM | POA: Insufficient documentation

## 2019-12-02 LAB — NOVEL CORONAVIRUS, NAA: SARS-CoV-2, NAA: NOT DETECTED

## 2020-05-26 ENCOUNTER — Other Ambulatory Visit: Payer: No Typology Code available for payment source

## 2020-06-29 ENCOUNTER — Other Ambulatory Visit: Payer: No Typology Code available for payment source

## 2020-06-29 ENCOUNTER — Other Ambulatory Visit: Payer: Self-pay

## 2020-06-29 DIAGNOSIS — Z20822 Contact with and (suspected) exposure to covid-19: Secondary | ICD-10-CM

## 2020-06-30 LAB — NOVEL CORONAVIRUS, NAA: SARS-CoV-2, NAA: NOT DETECTED

## 2020-08-22 ENCOUNTER — Other Ambulatory Visit: Payer: Self-pay

## 2020-08-22 ENCOUNTER — Encounter (HOSPITAL_COMMUNITY): Payer: Self-pay | Admitting: Emergency Medicine

## 2020-08-22 ENCOUNTER — Emergency Department (HOSPITAL_COMMUNITY)
Admission: EM | Admit: 2020-08-22 | Discharge: 2020-08-23 | Discharge status: Against Medical Advice | Payer: No Typology Code available for payment source | Attending: Emergency Medicine | Admitting: Emergency Medicine

## 2020-08-22 DIAGNOSIS — F17210 Nicotine dependence, cigarettes, uncomplicated: Secondary | ICD-10-CM | POA: Insufficient documentation

## 2020-08-22 DIAGNOSIS — R42 Dizziness and giddiness: Secondary | ICD-10-CM | POA: Insufficient documentation

## 2020-08-22 DIAGNOSIS — R739 Hyperglycemia, unspecified: Secondary | ICD-10-CM | POA: Insufficient documentation

## 2020-08-22 DIAGNOSIS — R110 Nausea: Secondary | ICD-10-CM | POA: Insufficient documentation

## 2020-08-22 DIAGNOSIS — H538 Other visual disturbances: Secondary | ICD-10-CM | POA: Insufficient documentation

## 2020-08-22 LAB — CBC WITH DIFFERENTIAL/PLATELET
Abs Immature Granulocytes: 0.05 10*3/uL (ref 0.00–0.07)
Basophils Absolute: 0.1 10*3/uL (ref 0.0–0.1)
Basophils Relative: 1 %
Eosinophils Absolute: 0.2 10*3/uL (ref 0.0–0.5)
Eosinophils Relative: 2 %
HCT: 41.6 % (ref 36.0–46.0)
Hemoglobin: 13 g/dL (ref 12.0–15.0)
Immature Granulocytes: 0 %
Lymphocytes Relative: 34 %
Lymphs Abs: 4.8 10*3/uL — ABNORMAL HIGH (ref 0.7–4.0)
MCH: 26.6 pg (ref 26.0–34.0)
MCHC: 31.3 g/dL (ref 30.0–36.0)
MCV: 85.1 fL (ref 80.0–100.0)
Monocytes Absolute: 1 10*3/uL (ref 0.1–1.0)
Monocytes Relative: 7 %
Neutro Abs: 8 10*3/uL — ABNORMAL HIGH (ref 1.7–7.7)
Neutrophils Relative %: 56 %
Platelets: 279 10*3/uL (ref 150–400)
RBC: 4.89 MIL/uL (ref 3.87–5.11)
RDW: 14.4 % (ref 11.5–15.5)
WBC: 14.2 10*3/uL — ABNORMAL HIGH (ref 4.0–10.5)
nRBC: 0 % (ref 0.0–0.2)

## 2020-08-22 LAB — COMPREHENSIVE METABOLIC PANEL
ALT: 23 U/L (ref 0–44)
AST: 17 U/L (ref 15–41)
Albumin: 3.6 g/dL (ref 3.5–5.0)
Alkaline Phosphatase: 59 U/L (ref 38–126)
Anion gap: 9 (ref 5–15)
BUN: 13 mg/dL (ref 6–20)
CO2: 20 mmol/L — ABNORMAL LOW (ref 22–32)
Calcium: 8.5 mg/dL — ABNORMAL LOW (ref 8.9–10.3)
Chloride: 109 mmol/L (ref 98–111)
Creatinine, Ser: 0.73 mg/dL (ref 0.44–1.00)
GFR, Estimated: >60 mL/min (ref >60)
Glucose, Bld: 108 mg/dL — ABNORMAL HIGH (ref 70–99)
Potassium: 3.8 mmol/L (ref 3.5–5.1)
Sodium: 138 mmol/L (ref 135–145)
Total Bilirubin: 0.8 mg/dL (ref 0.3–1.2)
Total Protein: 6.6 g/dL (ref 6.5–8.1)

## 2020-08-22 LAB — URINALYSIS, ROUTINE W REFLEX MICROSCOPIC
Bacteria, UA: NONE SEEN
Bilirubin Urine: NEGATIVE
Glucose, UA: NEGATIVE mg/dL
Ketones, ur: NEGATIVE mg/dL
Leukocytes,Ua: NEGATIVE
Nitrite: NEGATIVE
Protein, ur: NEGATIVE mg/dL
Specific Gravity, Urine: 1.024 (ref 1.005–1.030)
pH: 6 (ref 5.0–8.0)

## 2020-08-22 LAB — I-STAT BETA HCG BLOOD, ED (MC, WL, AP ONLY): I-stat hCG, quantitative: <5 m[IU]/mL (ref <5)

## 2020-08-22 NOTE — ED Triage Notes (Signed)
Patient reports lightheaded /nausea and elevated CBG=342 at work this evening .

## 2020-08-22 NOTE — ED Notes (Signed)
Patient notified staff that she is leaving and see her MD in the morning .

## 2020-08-23 LAB — CBG MONITORING, ED
Glucose-Capillary: 103 mg/dL — ABNORMAL HIGH (ref 70–99)
Glucose-Capillary: 92 mg/dL (ref 70–99)

## 2020-08-23 MED ORDER — ACETAMINOPHEN 500 MG PO TABS
1000.0000 mg | ORAL_TABLET | Freq: Once | ORAL | Status: AC
  Administered 2020-08-23: 1000 mg via ORAL
  Filled 2020-08-23: qty 2

## 2020-08-23 NOTE — ED Provider Notes (Signed)
[facility] EMERGENCY DEPARTMENT Provider Note   CSN: [id number] Arrival date & time: 08/22/20  2041     History Chief Complaint  Patient presents with  . Lightheaded/Nausea/Hyperglycemia    Ariana Rogers is a 34 y.o. female.  34 year old female presents to the emergency department for evaluation of lightheadedness and nausea.  She states that symptoms came on while she was at work.  Saw some spots in her vision, but never experienced vision loss.  Her lightheadedness and nausea have largely resolved.  EMS was called to her job at the time her symptoms occurred.  CBG with EMS was 342.  She also reports being hypertensive with a systolic of 180.  Had a in lunch 1 hour prior to her symptoms starting.  Does endorse a poor diet with many sugary and processed foods.  Denies chest pain, SOB, syncope, extremity numbness/paresthesias, vomiting, bowel changes.  The history is provided by the patient. No language interpreter was used.       Past Medical History:  Diagnosis Date  . Acute bronchitis   . Cough 03/2019  . GERD (gastroesophageal reflux disease) 03/2019  . Left breast mass   . Smoker     Patient Active Problem List   Diagnosis Date Noted  . Gastroesophageal reflux disease without esophagitis 04/21/2019  . Cough 04/21/2019  . Acute bronchitis   . Breast mass, left 01/28/2019  . Painful lumpy left breast 01/28/2019  . Maculopapular rash, generalized 12/30/2018  . Pityriasis rosea 12/30/2018  . Class 3 severe obesity due to excess calories without serious comorbidity with body mass index (BMI) of 40.0 to 44.9 in adult [facility]) 12/30/2018  . Itching 12/30/2018  . Leukocytosis 01/17/2018  . Prediabetes 01/17/2018    Past Surgical History:  Procedure Laterality Date  . DENTAL SURGERY    . TUBAL LIGATION  2 years ago     OB History    Gravida  4   Para      Term      Preterm      AB      Living  4     SAB      TAB      Ectopic       Multiple      Live Births  4           Family History  Problem Relation Age of Onset  . Hypertension Mother   . Arthritis Mother   . Fibromyalgia Mother   . Other Father        no health concern    Social History   Tobacco Use  . Smoking status: Current Every Day Smoker    Packs/day: 0.50    Types: Cigarettes  . Smokeless tobacco: Never Used  Vaping Use  . Vaping Use: Never used  Substance Use Topics  . Alcohol use: Yes    Comment: occasionally  . Drug use: No    Home Medications Prior to Admission medications   Medication Sig Start Date End Date Taking? Authorizing Provider  cetirizine (ZYRTEC) 10 MG tablet Take 1 tablet (10 mg total) by mouth daily. Patient taking differently: Take 10 mg by mouth daily as needed for allergies.  12/26/18  Yes Antony Madura, PA-C  fluticasone (FLONASE) 50 MCG/ACT nasal spray Place 2 sprays into both nostrils daily. Patient taking differently: Place 2 sprays into both nostrils daily as needed for allergies.  07/18/18  Yes Mike Gip, FNP    Allergies    Patient  has no known allergies.  Review of Systems   Review of Systems  Ten systems reviewed and are negative for acute change, except as noted in the HPI.    Physical Exam Updated Vital Signs BP 134/81 (BP Location: Right Arm)   Pulse 70   Temp 97.9 F (36.6 C) (Oral)   Resp 19   Ht 5\' 5"  (1.651 m)   Wt (!) 142 kg   SpO2 99%   BMI 52.09 kg/m   Physical Exam Vitals and nursing note reviewed.  Constitutional:      General: She is not in acute distress.    Appearance: She is well-developed. She is not diaphoretic.  HENT:     Head: Normocephalic and atraumatic.  Eyes:     General: No scleral icterus.    Conjunctiva/sclera: Conjunctivae normal.  Pulmonary:     Effort: Pulmonary effort is normal. No respiratory distress.  Musculoskeletal:        General: Normal range of motion.     Cervical back: Normal range of motion.  Skin:    General: Skin is warm and dry.       Coloration: Skin is not pale.     Findings: No erythema or rash.  Neurological:     Mental Status: She is alert and oriented to person, place, and time.     Coordination: Coordination normal.  Psychiatric:        Behavior: Behavior normal.     ED Results / Procedures / Treatments   Labs (all labs ordered are listed, but only abnormal results are displayed) Labs Reviewed  CBC WITH DIFFERENTIAL/PLATELET - Abnormal; Notable for the following components:      Result Value   WBC 14.2 (*)    Neutro Abs 8.0 (*)    Lymphs Abs 4.8 (*)    All other components within normal limits  COMPREHENSIVE METABOLIC PANEL - Abnormal; Notable for the following components:   CO2 20 (*)    Glucose, Bld 108 (*)    Calcium 8.5 (*)    All other components within normal limits  URINALYSIS, ROUTINE W REFLEX MICROSCOPIC - Abnormal; Notable for the following components:   APPearance HAZY (*)    Hgb urine dipstick SMALL (*)    All other components within normal limits  HEMOGLOBIN A1C  I-STAT BETA HCG BLOOD, ED ([facility], [facility], [facility] ONLY)  CBG MONITORING, ED    EKG None  Radiology No results found.  Procedures Procedures (including critical care time)  Medications Ordered in ED Medications  acetaminophen (TYLENOL) tablet 1,000 mg (1,000 mg Oral Given 08/23/20 0111)    ED Course  I have reviewed the triage vital signs and the nursing notes.  Pertinent labs & imaging results that were available during my care of the patient were reviewed by me and considered in my medical decision making (see chart for details).    MDM Rules/Calculators/A&P                          34 year old female presents to the emergency department for evaluation of episodic lightheadedness and nausea which occurred while she was at work.  EMS was called and she states that she was hypertensive with a systolic blood pressure of 180 with EMS.  Blood sugar was also approximately 380.  She had eaten about 1 hour prior to EMS  arrival and symptom onset.  Presently feels "foggy headed" but otherwise well.  Denies lightheadedness and nausea at this time.  No associated chest pain or shortness of breath.  I do see that patient was previously documented to be prediabetic.  This is not known to her.  While she was hyperglycemic with EMS, CBG in the ED is 108.  Have added on hemoglobin A1c which can be followed by her primary care doctor to assess for underlying diabetes.  Have counseled on dietary changes.  Advised return for new or concerning symptoms.  Patient discharged in stable condition with no unaddressed concerns.   Final Clinical Impression(s) / ED Diagnoses Final diagnoses:  Episodic lightheadedness    Rx / DC Orders ED Discharge Orders    None       Antony Madura, PA-C 08/23/20 0503    Nira Conn, MD 08/23/20 2031

## 2020-08-23 NOTE — ED Notes (Signed)
Per lab tech, Hemoglobin A1c will be added on to blood sent down prior.

## 2020-08-23 NOTE — Discharge Instructions (Signed)
Follow up with your primary care doctor for recheck of your blood pressure and to follow up on your hemoglobin A1C level. Try and modify your diet to eat more fruits and vegetables. Drink plenty of water.

## 2020-08-24 LAB — HEMOGLOBIN A1C
Hgb A1c MFr Bld: 6.3 % — ABNORMAL HIGH (ref 4.8–5.6)
Mean Plasma Glucose: 134 mg/dL

## 2020-08-26 ENCOUNTER — Encounter (HOSPITAL_COMMUNITY): Payer: Self-pay

## 2020-08-26 ENCOUNTER — Emergency Department (HOSPITAL_COMMUNITY): Payer: No Typology Code available for payment source

## 2020-08-26 ENCOUNTER — Emergency Department (HOSPITAL_COMMUNITY)
Admission: EM | Admit: 2020-08-26 | Discharge: 2020-08-26 | Disposition: A | Discharge status: Home / Self Care | Payer: No Typology Code available for payment source | Attending: Emergency Medicine | Admitting: Emergency Medicine

## 2020-08-26 ENCOUNTER — Other Ambulatory Visit: Payer: Self-pay

## 2020-08-26 DIAGNOSIS — F17210 Nicotine dependence, cigarettes, uncomplicated: Secondary | ICD-10-CM | POA: Insufficient documentation

## 2020-08-26 DIAGNOSIS — M25561 Pain in right knee: Secondary | ICD-10-CM | POA: Insufficient documentation

## 2020-08-26 DIAGNOSIS — X501XXA Overexertion from prolonged static or awkward postures, initial encounter: Secondary | ICD-10-CM | POA: Insufficient documentation

## 2020-08-26 MED ORDER — IBUPROFEN 400 MG PO TABS
600.0000 mg | ORAL_TABLET | Freq: Once | ORAL | Status: AC
  Administered 2020-08-26: 600 mg via ORAL
  Filled 2020-08-26: qty 1

## 2020-08-26 MED ORDER — NAPROXEN 500 MG PO TABS: 500.0000 mg | ORAL_TABLET | Freq: Two times a day (BID) | ORAL | 0 refills | Status: DC

## 2020-08-26 MED ORDER — OXYCODONE HCL 5 MG PO TABS
5.0000 mg | ORAL_TABLET | Freq: Once | ORAL | Status: AC
  Administered 2020-08-26: 5 mg via ORAL
  Filled 2020-08-26: qty 1

## 2020-08-26 MED FILL — NAPROXEN 500 MG TABLET: 500 | 10 days supply | Qty: 20 | Fill #0

## 2020-08-26 NOTE — ED Triage Notes (Signed)
Pt reports she twisted her Right knee this am and is unable to put pressure on her leg

## 2020-08-26 NOTE — Progress Notes (Signed)
Orthopedic Tech Progress Note Patient Details:  Ariana Rogers April 12, 1986 [id number]  Ortho Devices Type of Ortho Device: Knee Sleeve, Crutches Ortho Device/Splint Location: RLE Ortho Device/Splint Interventions: Ordered, Application, Adjustment   Post Interventions Patient Tolerated: Well Instructions Provided: Care of device, Adjustment of device, Poper ambulation with device   Verne Cove 08/26/2020, 2:18 PM

## 2020-08-26 NOTE — Discharge Instructions (Signed)
Please read and follow all provided instructions.  Your diagnoses today include:  1. Acute pain of right knee     Tests performed today include:  An x-ray of the affected area - does NOT show any broken bones  Vital signs. See below for your results today.   Medications prescribed:   Naproxen - anti-inflammatory pain medication  Do not exceed 500mg  naproxen every 12 hours, take with food  You have been prescribed an anti-inflammatory medication or NSAID. Take with food. Take smallest effective dose for the shortest duration needed for your pain. Stop taking if you experience stomach pain or vomiting.   Take any prescribed medications only as directed.  Home care instructions:   Follow any educational materials contained in this packet  Follow R.I.C.E. Protocol:  R - rest your injury   I  - use ice on injury without applying directly to skin  C - compress injury with bandage or splint  E - elevate the injury as much as possible  Follow-up instructions: Please follow-up with your primary care provider or the provided orthopedic physician (bone specialist) if you continue to have significant pain in 1 week. In this case you may have a more severe injury that requires further care.   Return instructions:   Please return if your toes or feet are numb or tingling, appear gray or Marciel, or you have severe pain (also elevate the leg and loosen splint or wrap if you were given one)  Please return to the Emergency Department if you experience worsening symptoms.   Please return if you have any other emergent concerns.  Additional Information:  Your vital signs today were: BP (!) 139/99 (BP Location: Right Arm)   Pulse (!) 101   Temp 98.3 F (36.8 C) (Oral)   Resp 16   Ht 5' 4.75" (1.645 m)   Wt 129.7 kg   SpO2 98%   BMI 47.96 kg/m  If your blood pressure (BP) was elevated above 135/85 this visit, please have this repeated by your doctor within one  month. -------------- If prescribed crutches for your injury: use crutches with non-weight bearing for the first few days. Then, you may walk as the pain allows, or as instructed. Start gradually with weight bearing on the affected side. Once you can walk pain free, then try jogging. When you can run forwards, then you can try moving side-to-side. If you cannot walk without crutches in one week, you need a re-check. --------------

## 2020-08-26 NOTE — ED Notes (Signed)
Ortho tech called, en route 
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
## 2020-09-27 ENCOUNTER — Encounter: Payer: Self-pay | Admitting: Family Medicine

## 2020-09-27 ENCOUNTER — Ambulatory Visit (INDEPENDENT_AMBULATORY_CARE_PROVIDER_SITE_OTHER): Payer: Self-pay | Admitting: Family Medicine

## 2020-09-27 ENCOUNTER — Other Ambulatory Visit: Payer: Self-pay

## 2020-09-27 VITALS — BP 120/78 | HR 88 | Temp 97.9°F | Ht 65.0 in | Wt 288.2 lb

## 2020-09-27 DIAGNOSIS — M25561 Pain in right knee: Secondary | ICD-10-CM

## 2020-09-27 DIAGNOSIS — N920 Excessive and frequent menstruation with regular cycle: Secondary | ICD-10-CM

## 2020-09-27 DIAGNOSIS — Z23 Encounter for immunization: Secondary | ICD-10-CM

## 2020-09-27 DIAGNOSIS — Z09 Encounter for follow-up examination after completed treatment for conditions other than malignant neoplasm: Secondary | ICD-10-CM

## 2020-09-27 DIAGNOSIS — N946 Dysmenorrhea, unspecified: Secondary | ICD-10-CM

## 2020-09-27 DIAGNOSIS — Z8709 Personal history of other diseases of the respiratory system: Secondary | ICD-10-CM

## 2020-09-27 DIAGNOSIS — R5383 Other fatigue: Secondary | ICD-10-CM

## 2020-09-27 DIAGNOSIS — Z9181 History of falling: Secondary | ICD-10-CM

## 2020-09-27 DIAGNOSIS — R609 Edema, unspecified: Secondary | ICD-10-CM

## 2020-09-27 DIAGNOSIS — Z720 Tobacco use: Secondary | ICD-10-CM

## 2020-09-27 DIAGNOSIS — Z716 Tobacco abuse counseling: Secondary | ICD-10-CM

## 2020-09-27 DIAGNOSIS — Z0000 Encounter for general adult medical examination without abnormal findings: Secondary | ICD-10-CM

## 2020-09-27 MED ORDER — NAPROXEN 500 MG PO TABS: 500.0000 mg | ORAL_TABLET | Freq: Two times a day (BID) | ORAL | 3 refills | Status: DC

## 2020-09-27 MED ORDER — FUROSEMIDE 20 MG PO TABS: 20.0000 mg | ORAL_TABLET | Freq: Every day | ORAL | 3 refills | Status: DC

## 2020-09-27 MED ORDER — BUPROPION HCL ER (SR) 100 MG PO TB12: 100.0000 mg | ORAL_TABLET | Freq: Two times a day (BID) | ORAL | 3 refills | Status: DC

## 2020-09-27 MED FILL — NAPROXEN 500 MG TABLET: 500 | 30 days supply | Qty: 60 | Fill #0

## 2020-09-27 MED FILL — buPROPion HCL ER (SR) 100 M: 100 | 30 days supply | Qty: 60 | Fill #0

## 2020-09-27 NOTE — Progress Notes (Signed)
[facility]    Hospital Follow Up   Subjective:  Patient ID: Ariana Rogers, female    DOB: [birth year]  Age: 34 y.o. MRN: [id number]  CC:  Chief Complaint  Patient presents with  . Follow-up    Pt states she is breaking out in hives only @ night. X1 month.    HPI Ariana Rogers is a 34 year old female who presents for Follow Up today.    Patient Active Problem List   Diagnosis Date Noted  . Gastroesophageal reflux disease without esophagitis 04/21/2019  . Cough 04/21/2019  . Acute bronchitis   . Breast mass, left 01/28/2019  . Painful lumpy left breast 01/28/2019  . Maculopapular rash, generalized 12/30/2018  . Pityriasis rosea 12/30/2018  . Class 3 severe obesity due to excess calories without serious comorbidity with body mass index (BMI) of 40.0 to 44.9 in adult [facility]) 12/30/2018  . Itching 12/30/2018  . Leukocytosis 01/17/2018  . Prediabetes 01/17/2018   Current Status: Since her last office visit, she has c/o of menorrhagia and dysmenorrhagia X 4 months now. She denies history of Fibroids.  She also had c/o fatigue and lightheadedness, which recent history of fall. She is very tired during the day. She is currently smoking 8 cigarettes a day. She denies fevers, chills, fatigue, recent infections, weight loss, and night sweats. She has not had any headaches, visual changes, dizziness, and falls. No chest pain, heart palpitations, cough and shortness of breath reported. Denies GI problems such as nausea, vomiting, diarrhea, and constipation. She has no reports of blood in stools, dysuria and hematuria. No depression or anxiety reported today. She is taking all medications as prescribed. She denies pain today.    Past Medical History:  Diagnosis Date  . Acute bronchitis   . Cough 03/2019  . GERD (gastroesophageal reflux disease) 03/2019  . Left breast mass   . Smoker     Past Surgical History:  Procedure Laterality Date  .  DENTAL SURGERY    . TUBAL LIGATION  2 years ago    Family History  Problem Relation Age of Onset  . Hypertension Mother   . Arthritis Mother   . Fibromyalgia Mother   . Other Father        no health concern    Social History   Socioeconomic History  . Marital status: Single    Spouse name: Not on file  . Number of children: 4  . Years of education: Not on file  . Highest education level: Some college, no degree  Occupational History  . Not on file  Tobacco Use  . Smoking status: Current Every Day Smoker    Packs/day: 0.50    Types: Cigarettes  . Smokeless tobacco: Never Used  Vaping Use  . Vaping Use: Never used  Substance and Sexual Activity  . Alcohol use: Yes    Comment: occasionally  . Drug use: No  . Sexual activity: Yes    Birth control/protection: Surgical  Other Topics Concern  . Not on file  Social History Narrative  . Not on file   Social Determinants of Health   Financial Resource Strain:   . Difficulty of Paying Living Expenses: Not on file  Food Insecurity:   . Worried About Programme researcher, broadcasting/film/video in the Last Year: Not on file  . Ran Out of Food in the Last Year: Not on file  Transportation Needs:   . Lack of Transportation (Medical): Not  on file  . Lack of Transportation (Non-Medical): Not on file  Physical Activity:   . Days of Exercise per Week: Not on file  . Minutes of Exercise per Session: Not on file  Stress:   . Feeling of Stress : Not on file  Social Connections:   . Frequency of Communication with Friends and Family: Not on file  . Frequency of Social Gatherings with Friends and Family: Not on file  . Attends Religious Services: Not on file  . Active Member of Clubs or Organizations: Not on file  . Attends Banker Meetings: Not on file  . Marital Status: Not on file  Intimate Partner Violence:   . Fear of Current or Ex-Partner: Not on file  . Emotionally Abused: Not on file  . Physically Abused: Not on file  .  Sexually Abused: Not on file    Outpatient Medications Prior to Visit  Medication Sig Dispense Refill  . naproxen (NAPROSYN) 500 MG tablet Take 1 tablet (500 mg total) by mouth 2 (two) times daily. 20 tablet 0   No facility-administered medications prior to visit.    No Known Allergies  ROS Review of Systems  Constitutional: Negative.   HENT: Negative.   Eyes: Negative.   Respiratory: Negative.   Cardiovascular: Positive for leg swelling (occasional ).  Gastrointestinal: Positive for abdominal distention (obese).  Endocrine: Negative.   Genitourinary: Negative.   Musculoskeletal: Positive for arthralgias (generalized).  Skin: Negative.   Allergic/Immunologic: Negative.   Neurological: Positive for dizziness (occasional ) and headaches (occasional ).  Hematological: Negative.   Psychiatric/Behavioral: Negative.       Objective:    Physical Exam Vitals and nursing note reviewed.  Constitutional:      Appearance: Normal appearance.  HENT:     Head: Normocephalic and atraumatic.     Nose: Nose normal.     Mouth/Throat:     Mouth: Mucous membranes are moist.     Pharynx: Oropharynx is clear.  Cardiovascular:     Rate and Rhythm: Normal rate and regular rhythm.     Pulses: Normal pulses.     Heart sounds: Normal heart sounds.  Pulmonary:     Effort: Pulmonary effort is normal.     Breath sounds: Normal breath sounds.  Abdominal:     General: Bowel sounds are normal. There is distension (obese).     Palpations: Abdomen is soft.  Musculoskeletal:        General: Normal range of motion.     Cervical back: Normal range of motion and neck supple.     Right lower leg: Edema (mild) present.     Left lower leg: Edema (mild) present.  Skin:    General: Skin is warm and dry.  Neurological:     General: No focal deficit present.     Mental Status: She is alert and oriented to person, place, and time.  Psychiatric:        Mood and Affect: Mood normal.        Behavior:  Behavior normal.        Thought Content: Thought content normal.        Judgment: Judgment normal.     BP 120/78 (BP Location: Left Arm, Patient Position: Sitting, Cuff Size: Large)   Pulse 88   Temp 97.9 F (36.6 C)   Ht 5\' 5"  (1.651 m)   Wt 288 lb 3.2 oz (130.7 kg)   LMP 09/22/2020 (Exact Date)   BMI 47.96 kg/m  Wt Readings from Last 3 Encounters:  09/27/20 288 lb 3.2 oz (130.7 kg)  08/26/20 286 lb (129.7 kg)  08/22/20 (!) 313 lb 0.9 oz (142 kg)     Health Maintenance Due  Topic Date Due  . Hepatitis C Screening  Never done  . COVID-19 Vaccine (1) Never done  . HIV Screening  Never done    There are no preventive care reminders to display for this patient.  Lab Results  Component Value Date   TSH 1.170 04/21/2019   Lab Results  Component Value Date   WBC 14.2 (H) 08/22/2020   HGB 13.0 08/22/2020   HCT 41.6 08/22/2020   MCV 85.1 08/22/2020   PLT 279 08/22/2020   Lab Results  Component Value Date   NA 138 08/22/2020   K 3.8 08/22/2020   CO2 20 (L) 08/22/2020   GLUCOSE 108 (H) 08/22/2020   BUN 13 08/22/2020   CREATININE 0.73 08/22/2020   BILITOT 0.8 08/22/2020   ALKPHOS 59 08/22/2020   AST 17 08/22/2020   ALT 23 08/22/2020   PROT 6.6 08/22/2020   ALBUMIN 3.6 08/22/2020   CALCIUM 8.5 (L) 08/22/2020   ANIONGAP 9 08/22/2020   Lab Results  Component Value Date   CHOL 155 04/21/2019   Lab Results  Component Value Date   HDL 39 (L) 04/21/2019   Lab Results  Component Value Date   LDLCALC 98 04/21/2019   Lab Results  Component Value Date   TRIG 91 04/21/2019   Lab Results  Component Value Date   CHOLHDL 4.0 04/21/2019   Lab Results  Component Value Date   HGBA1C 6.3 (H) 08/22/2020      Assessment & Plan:   1. Hospital discharge follow-up  2. History of recent fall  3. Acute pain of right knee Stable today.   4. History of bronchitis Stable today. No signs or symptoms of respiratory distress noted or reported today.   5.  Menorrhagia with regular cycle - Ambulatory referral to Gynecology - naproxen (NAPROSYN) 500 MG tablet; Take 1 tablet (500 mg total) by mouth 2 (two) times daily.  Dispense: 60 tablet; Refill: 3  6. Dysmenorrhea - Ambulatory referral to Gynecology - naproxen (NAPROSYN) 500 MG tablet; Take 1 tablet (500 mg total) by mouth 2 (two) times daily.  Dispense: 60 tablet; Refill: 3  7. Tobacco use - buPROPion (WELLBUTRIN SR) 100 MG 12 hr tablet; Take 1 tablet (100 mg total) by mouth 2 (two) times daily.  Dispense: 60 tablet; Refill: 3  8. Encounter for smoking cessation counseling We will initiate Bupropion to aid in smoking cessation today.  - buPROPion (WELLBUTRIN SR) 100 MG 12 hr tablet; Take 1 tablet (100 mg total) by mouth 2 (two) times daily.  Dispense: 60 tablet; Refill: 3  9. Fatigue, unspecified type - CBC with Differential - Iron and TIBC([facility]) - Vitamin B12 - Vitamin D, 25-hydroxy  10. Edema, unspecified type We will initiate Lasix today.  - furosemide (LASIX) 20 MG tablet; Take 1 tablet (20 mg total) by mouth daily.  Dispense: 30 tablet; Refill: 3  11. Healthcare maintenance - Flu Vaccine QUAD 6+ mos PF IM (Fluarix Quad PF)  12. Follow up She will follow up in 3 months.    Meds ordered this encounter  Medications  . naproxen (NAPROSYN) 500 MG tablet    Sig: Take 1 tablet (500 mg total) by mouth 2 (two) times daily.    Dispense:  60 tablet    Refill:  3  .  buPROPion (WELLBUTRIN SR) 100 MG 12 hr tablet    Sig: Take 1 tablet (100 mg total) by mouth 2 (two) times daily.    Dispense:  60 tablet    Refill:  3  . furosemide (LASIX) 20 MG tablet    Sig: Take 1 tablet (20 mg total) by mouth daily.    Dispense:  30 tablet    Refill:  3   Orders Placed This Encounter  Procedures  . Flu Vaccine QUAD 6+ mos PF IM (Fluarix Quad PF)  . CBC with Differential  . Iron and TIBC([facility])  . Vitamin B12  . Vitamin D, 25-hydroxy  . Ambulatory referral to  Gynecology     Referral Orders     Ambulatory referral to Gynecology   Raliegh Ip,  MSN, FNP-BC [facility] [address] [place], [place] [address] [phone] fax  Problem List Items Addressed This Visit      Respiratory   Acute bronchitis    Other Visit Diagnoses    Hospital discharge follow-up    -  Primary   History of recent fall       Acute pain of right knee       Menorrhagia with regular cycle       Relevant Medications   naproxen (NAPROSYN) 500 MG tablet   Other Relevant Orders   Ambulatory referral to Gynecology   Dysmenorrhea       Relevant Medications   naproxen (NAPROSYN) 500 MG tablet   Other Relevant Orders   Ambulatory referral to Gynecology   Tobacco use       Relevant Medications   buPROPion (WELLBUTRIN SR) 100 MG 12 hr tablet   Encounter for smoking cessation counseling       Relevant Medications   buPROPion (WELLBUTRIN SR) 100 MG 12 hr tablet   Fatigue, unspecified type       Relevant Orders   CBC with Differential   Iron and TIBC([facility])   Vitamin B12   Vitamin D, 25-hydroxy   Edema, unspecified type       Relevant Medications   furosemide (LASIX) 20 MG tablet   Healthcare maintenance       Relevant Orders   Flu Vaccine QUAD 6+ mos PF IM (Fluarix Quad PF) (Completed)   Follow up          Meds ordered this encounter  Medications  . naproxen (NAPROSYN) 500 MG tablet    Sig: Take 1 tablet (500 mg total) by mouth 2 (two) times daily.    Dispense:  60 tablet    Refill:  3  . buPROPion (WELLBUTRIN SR) 100 MG 12 hr tablet    Sig: Take 1 tablet (100 mg total) by mouth 2 (two) times daily.    Dispense:  60 tablet    Refill:  3  . furosemide (LASIX) 20 MG tablet    Sig: Take 1 tablet (20 mg total) by mouth daily.    Dispense:  30 tablet    Refill:  3    Follow-up: Return in about 3 months (around 12/27/2020).     Kallie Locks, FNP

## 2020-09-27 NOTE — Patient Instructions (Addendum)
Dysmenorrhea Dysmenorrhea means painful cramps during your period (menstrual period). You will have pain in your lower belly (abdomen). The pain is caused by the tightening (contracting) of the muscles of the womb (uterus). The pain may be mild or very bad. With this condition, you may:  Have a headache.  Feel sick to your stomach (nauseous).  Throw up (vomit).  Have lower back pain. Follow these instructions at home: Helping pain and cramping   Put heat on your lower back or belly when you have pain or cramps. Use the heat source that your doctor tells you to use. ? Place a towel between your skin and the heat. ? Leave the heat on for 20-30 minutes. ? Remove the heat if your skin turns bright red. This is especially important if you cannot feel pain, heat, or cold. ? Do not have a heating pad on during sleep.  Do aerobic exercises. These include walking, swimming, or biking. These may help with cramps.  Massage your lower back or belly. This may help lessen pain. General instructions  Take over-the-counter and prescription medicines only as told by your doctor.  Do not drive or use heavy machinery while taking prescription pain medicine.  Avoid alcohol and caffeine during and right before your period. These can make cramps worse.  Do not use any products that have nicotine or tobacco. These include cigarettes and e-cigarettes. If you need help quitting, ask your doctor.  Keep all follow-up visits as told by your doctor. This is important. Contact a doctor if:  You have pain that gets worse.  You have pain that does not get better with medicine.  You have pain during sex.  You feel sick to your stomach or you throw up during your period, and medicine does not help. Get help right away if:  You pass out (faint). Summary  Dysmenorrhea means painful cramps during your period (menstrual period).  Put heat on your lower back or belly when you have pain or cramps.  Do  exercises like walking, swimming, or biking to help with cramps.  Contact a doctor if you have pain during sex. This information is not intended to replace advice given to you by your health care provider. Make sure you discuss any questions you have with your health care provider. Document Revised: 09/28/2017 Document Reviewed: 11/02/2016 Elsevier Patient Education  2020 [employer] Menorrhagia Menorrhagia is when your menstrual periods are heavy or last longer than normal. Follow these instructions at home: Medicines   Take over-the-counter and prescription medicines exactly as told by your doctor. This includes iron pills.  Do not change or switch medicines without asking your doctor.  Do not take aspirin or medicines that contain aspirin 1 week before or during your period. Aspirin may make bleeding worse. General instructions  If you need to change your pad or tampon more than once every 2 hours, limit your activity until the bleeding stops.  Iron pills can cause problems when pooping (constipation). To prevent or treat pooping problems while taking prescription iron pills, your doctor may suggest that you: ? Drink enough fluid to keep your pee (urine) clear or pale yellow. ? Take over-the-counter or prescription medicines. ? Eat foods that are high in fiber. These foods include:  Fresh fruits and vegetables.  Whole grains.  Beans. ? Limit foods that are high in fat and processed sugars. This includes fried and sweet foods.  Eat healthy meals and foods that are high in iron. Foods that have  a lot of iron include: ? Leafy green vegetables. ? Meat. ? Liver. ? Eggs. ? Whole grain breads and cereals.  Do not try to lose weight until your heavy bleeding has stopped and you have normal amounts of iron in your blood. If you need to lose weight, work with your doctor.  Keep all follow-up visits as told by your doctor. This is important. Contact a doctor if:  You soak  through a pad or tampon every 1 or 2 hours, and this happens every time you have a period.  You need to use pads and tampons at the same time because you are bleeding so much.  You are taking medicine and you: ? Feel sick to your stomach (nauseous). ? Throw up (vomit). ? Have watery poop (diarrhea).  You have other problems that may be related to the medicine you are taking. Get help right away if:  You soak through more than a pad or tampon in 1 hour.  You pass clots bigger than 1 inch (2.5 cm) wide.  You feel short of breath.  You feel like your heart is beating too fast.  You feel dizzy or you pass out (faint).  You feel very weak or tired. Summary  Menorrhagia is when your menstrual periods are heavy or last longer than normal.  Take over-the-counter and prescription medicines exactly as told by your doctor. This includes iron pills.  Contact a doctor if you soak through more than a pad or tampon in 1 hour or are passing large clots. This information is not intended to replace advice given to you by your health care provider. Make sure you discuss any questions you have with your health care provider. Document Revised: 01/23/2018 Document Reviewed: 11/06/2016 Elsevier Patient Education  2020 [employer] Naproxen; Sumatriptan tablets What is this medicine? NAPROXEN; SUMATRIPTAN (na PROS en; soo ma TRIP tan) is used to treat migraines with or without aura. An aura is a strange feeling or visual disturbance that warns you of an attack. This medicine is not used to prevent migraines. This medicine may be used for other purposes; ask your health care provider or pharmacist if you have questions. COMMON BRAND NAME(S): Treximet What should I tell my health care provider before I take this medicine? They need to know if you have any of these conditions:  cigarette smoker  circulation problems in fingers and toes  coronary artery bypass graft (CABG) surgery within the past  2 weeks  diabetes  drink more than 3 alcohol-containing drinks per day  heart disease  high blood pressure  high cholesterol  history of irregular heartbeat  history of stomach bleeding  history of stroke  kidney disease  liver disease  lung or breathing disease, like asthma  stomach or intestine problems  an unusual or allergic reaction to naproxen, aspirin, other NSAIDs, sumatriptan, other medicines, foods, dyes, or preservatives  pregnant or trying to get pregnant  breast-feeding How should I use this medicine? Take this medicine by mouth with a glass of water. Follow the directions on the prescription label. You can take it with or without food. If it upsets your stomach, take it with food. Do not cut, crush, or chew this medicine. Do not take it more often than directed. A special MedGuide will be given to you by the pharmacist with each prescription and refill. Be sure to read this information carefully each time. Talk to your pediatrician regarding the use of this medicine in children. While this drug  may be prescribed for children as young as 12 for selected conditions, precautions do apply. Overdosage: If you think you have taken too much of this medicine contact a poison control center or emergency room at once. NOTE: This medicine is only for you. Do not share this medicine with others. What if I miss a dose? This does not apply. This medicine is not for regular use. What may interact with this medicine? Do not take this medicine with any of the following medicines:  certain medicines for migraine headache like almotriptan, eletriptan, frovatriptan, naratriptan, rizatriptan, sumatriptan, zolmitriptan  cidofovir  ergot alkaloids like dihydroergotamine, ergonovine, ergotamine, methylergonovine  ketorolac  MAOIs like Carbex, Eldepryl, Marplan, Nardil, and Parnate This medicine may also interact with the following medications:  aspirin  certain medicines  for blood pressure, heart disease, irregular heart beat  certain medicines for depression, anxiety, or psychotic disorders  certain medicines that treat or prevent blood clots like warfarin, enoxaparin, dalteparin, apixaban, dabigatran, and rivaroxaban  cyclosporine  methotrexate  NSAIDs, medicines for pain and inflammation, like ibuprofen or naproxen  pemetrexed  probenecid This list may not describe all possible interactions. Give your health care provider a list of all the medicines, herbs, non-prescription drugs, or dietary supplements you use. Also tell them if you smoke, drink alcohol, or use illegal drugs. Some items may interact with your medicine. What should I watch for while using this medicine? Visit your healthcare provider for regular checks on your progress. Tell your healthcare provider if your symptoms do not start to get better or if they get worse. This medicine may cause serious skin reactions. They can happen weeks to months after starting the medicine. Contact your healthcare provider right away if you notice fevers or flu-like symptoms with a rash. The rash may be red or purple and then turn into blisters or peeling of the skin. Or, you might notice a red rash with swelling of the face, lips or lymph nodes in your neck or under your arms. You may get drowsy or dizzy. Do not drive, use machinery, or do anything that needs mental alertness until you know how this medicine affects you. Do not stand up or sit up quickly, especially if you are an older patient. This reduces the risk of dizzy or fainting spells. Alcohol may interfere with the effect of this medicine. Avoid alcoholic drinks. Do not take other medicines that contain aspirin, ibuprofen, or naproxen with this medicine. Side effects such as stomach upset, nausea, or ulcers may be more likely to occur. Many non-prescription medicines contain aspirin, ibuprofen, or naproxen. Always read labels carefully. This medicine  can cause ulcers and bleeding in the stomach and intestines at any time during treatment. This can happen with no warning and may cause death. Smoking, drinking alcohol, older age, and poor health can also increase risks. Call your healthcare provider right away if you have stomach pain or blood in your vomit or stool. This medicine does not prevent a heart attack or stroke. This medicine may increase the chance of a heart attack or stroke. The chance may increase the longer you use this medicine or if you have heart disease. If you take aspirin to prevent a heart attack or stroke, talk to your healthcare provider about using this medicine. This medicine may increase your risk to bruise or bleed. Call your healthcare provider if you notice any unusual bleeding. Tell your healthcare provider right away if you have any change in your eyesight. Your mouth may  get dry. Chewing sugarless gum or sucking hard candy and drinking plenty of water may help. Contact your healthcare provider if the problem does not go away or is severe. If you take migraine medicines for 10 or more days a month, your migraines may get worse. Keep a diary of headache days and medicine use. Contact your healthcare provider if your migraine attacks occur more frequently. What side effects may I notice from receiving this medicine? Side effects that you should report to your doctor or health care professional as soon as possible:  allergic reactions like skin rash, itching or hives, swelling of the face, lips, or tongue  changes in vision  chest pain or chest tightness  redness, blistering, peeling, or loosening of the skin, including inside the mouth  signs and symptoms of a blood clot such as changes in vision; chest pain; severe, sudden headache; trouble speaking; sudden numbness or weakness of the face, arm, or leg  signs and symptoms of a dangerous change in heartbeat or heart rhythm like chest pain; dizziness; fast,  irregular heartbeat; palpitations; feeling faint or lightheaded; falls; breathing problems  signs and symptoms of a stroke like changes in vision; confusion; trouble speaking or understanding; severe headaches; sudden numbness or weakness of the face, arm or leg; trouble walking; dizziness; loss of balance or coordination  signs and symptoms of bleeding such as bloody or black, tarry stools; red or dark brown urine; spitting up blood or brown material that looks like coffee grounds; red spots on the skin; unusual bruising or bleeding from the eyes, gums, or nose  signs and symptoms of liver injury like dark yellow or brown urine; general ill feeling or flu-like symptoms; light-colored stools; loss of appetite; nausea; right upper belly pain; unusually weak or tired; yellowing of the eyes or skin  signs and symptoms of serotonin syndrome like irritable; confusion; diarrhea; fast or irregular heartbeat; muscle twitching; stiff muscles; trouble walking; sweating; high fever; seizures; chills; vomiting Side effects that usually do not require medical attention (report to your doctor or health care professional if they continue or are bothersome):  diarrhea  dizziness  drowsiness  dry mouth  headache  nausea, vomiting  pain, tingling, numbness in the hands or feet  stomach pain This list may not describe all possible side effects. Call your doctor for medical advice about side effects. You may report side effects to FDA at 1-800-FDA-1088. Where should I keep my medicine? Keep out of the reach of children. Store at room temperature between 15 and 30 degrees C (59 and 86 degrees F). Throw away any unused medicine after the expiration date. NOTE: This sheet is a summary. It may not cover all possible information. If you have questions about this medicine, talk to your doctor, pharmacist, or health care provider.  2020 Elsevier/Gold Standard (2019-01-07 07:57:43)    Bupropion  sustained-release tablets (smoking cessation) What is this medicine? BUPROPION (byoo PROE pee on) is used to help people quit smoking. This medicine may be used for other purposes; ask your health care provider or pharmacist if you have questions. COMMON BRAND NAME(S): Buproban, Zyban What should I tell my health care provider before I take this medicine? They need to know if you have any of these conditions:  an eating disorder, such as anorexia or bulimia  bipolar disorder or psychosis  diabetes or high blood sugar, treated with medication  glaucoma  head injury or brain tumor  heart disease, previous heart attack, or irregular heart beat  high blood pressure  kidney or liver disease  seizures  suicidal thoughts or a previous suicide attempt  Tourette's syndrome  weight loss  an unusual or allergic reaction to bupropion, other medicines, foods, dyes, or preservatives  breast-feeding  pregnant or trying to become pregnant How should I use this medicine? Take this medicine by mouth with a glass of water. Follow the directions on the prescription label. You can take it with or without food. If it upsets your stomach, take it with food. Do not cut, crush or chew this medicine. Take your medicine at regular intervals. If you take this medicine more than once a day, take your second dose at least 8 hours after you take your first dose. To limit difficulty in sleeping, avoid taking this medicine at bedtime. Do not take your medicine more often than directed. Do not stop taking this medicine suddenly except upon the advice of your doctor. Stopping this medicine too quickly may cause serious side effects. A special MedGuide will be given to you by the pharmacist with each prescription and refill. Be sure to read this information carefully each time. Talk to your pediatrician regarding the use of this medicine in children. Special care may be needed. Overdosage: If you think you have  taken too much of this medicine contact a poison control center or emergency room at once. NOTE: This medicine is only for you. Do not share this medicine with others. What if I miss a dose? If you miss a dose, skip the missed dose and take your next tablet at the regular time. There should be at least 8 hours between doses. Do not take double or extra doses. What may interact with this medicine? Do not take this medicine with any of the following medications:  linezolid  MAOIs like Azilect, Carbex, Eldepryl, Marplan, Nardil, and Parnate  methylene Magana (injected into a vein)  other medicines that contain bupropion like Wellbutrin This medicine may also interact with the following medications:  alcohol  certain medicines for anxiety or sleep  certain medicines for blood pressure like metoprolol, propranolol  certain medicines for depression or psychotic disturbances  certain medicines for HIV or AIDS like efavirenz, lopinavir, nelfinavir, ritonavir  certain medicines for irregular heart beat like propafenone, flecainide  certain medicines for Parkinson's disease like amantadine, levodopa  certain medicines for seizures like carbamazepine, phenytoin, phenobarbital  cimetidine  clopidogrel  cyclophosphamide  digoxin  furazolidone  isoniazid  nicotine  orphenadrine  procarbazine  steroid medicines like prednisone or cortisone  stimulant medicines for attention disorders, weight loss, or to stay awake  tamoxifen  theophylline  thiotepa  ticlopidine  tramadol  warfarin This list may not describe all possible interactions. Give your health care provider a list of all the medicines, herbs, non-prescription drugs, or dietary supplements you use. Also tell them if you smoke, drink alcohol, or use illegal drugs. Some items may interact with your medicine. What should I watch for while using this medicine? Visit your doctor or healthcare provider for regular  checks on your progress. This medicine should be used together with a patient support program. It is important to participate in a behavioral program, counseling, or other support program that is recommended by your healthcare provider. This medicine may cause serious skin reactions. They can happen weeks to months after starting the medicine. Contact your healthcare provider right away if you notice fevers or flu-like symptoms with a rash. The rash may be red or purple and then turn into  blisters or peeling of the skin. Or, you might notice a red rash with swelling of the face, lips or lymph nodes in your neck or under your arms. Patients and their families should watch out for new or worsening thoughts of suicide or depression. Also watch out for sudden changes in feelings such as feeling anxious, agitated, panicky, irritable, hostile, aggressive, impulsive, severely restless, overly excited and hyperactive, or not being able to sleep. If this happens, especially at the beginning of treatment or after a change in dose, call your healthcare provider. Avoid alcoholic drinks while taking this medicine. Drinking excessive alcoholic beverages, using sleeping or anxiety medicines, or quickly stopping the use of these agents while taking this medicine may increase your risk for a seizure. Do not drive or use heavy machinery until you know how this medicine affects you. This medicine can impair your ability to perform these tasks. Do not take this medicine close to bedtime. It may prevent you from sleeping. Your mouth may get dry. Chewing sugarless gum or sucking hard candy, and drinking plenty of water may help. Contact your doctor if the problem does not go away or is severe. Do not use nicotine patches or chewing gum without the advice of your doctor or healthcare provider while taking this medicine. You may need to have your blood pressure taken regularly if your doctor recommends that you use both nicotine and  this medicine together. What side effects may I notice from receiving this medicine? Side effects that you should report to your doctor or health care professional as soon as possible:  allergic reactions like skin rash, itching or hives, swelling of the face, lips, or tongue  breathing problems  changes in vision  confusion  elevated mood, decreased need for sleep, racing thoughts, impulsive behavior  fast or irregular heartbeat  hallucinations, loss of contact with reality  increased blood pressure  rash, fever, and swollen lymph nodes  redness, blistering, peeling, or loosening of the skin, including inside the mouth  seizures  suicidal thoughts or other mood changes  unusually weak or tired  vomiting Side effects that usually do not require medical attention (report to your doctor or health care professional if they continue or are bothersome):  constipation  headache  loss of appetite  nausea  tremors  weight loss This list may not describe all possible side effects. Call your doctor for medical advice about side effects. You may report side effects to FDA at 1-800-FDA-1088. Where should I keep my medicine? Keep out of the reach of children. Store at room temperature between 20 and 25 degrees C (68 and 77 degrees F). Protect from light. Keep container tightly closed. Throw away any unused medicine after the expiration date. NOTE: This sheet is a summary. It may not cover all possible information. If you have questions about this medicine, talk to your doctor, pharmacist, or health care provider.  2020 Elsevier/Gold Standard (2019-01-09 13:59:09) Bupropion extended-release tablets (Depression/Mood Disorders) What is this medicine? BUPROPION (byoo PROE pee on) is used to treat depression. This medicine may be used for other purposes; ask your health care provider or pharmacist if you have questions. COMMON BRAND NAME(S): Aplenzin, Budeprion XL, Forfivo XL,  Wellbutrin XL What should I tell my health care provider before I take this medicine? They need to know if you have any of these conditions:  an eating disorder, such as anorexia or bulimia  bipolar disorder or psychosis  diabetes or high blood sugar, treated with medication  glaucoma  head injury or brain tumor  heart disease, previous heart attack, or irregular heart beat  high blood pressure  kidney or liver disease  seizures (convulsions)  suicidal thoughts or a previous suicide attempt  Tourette's syndrome  weight loss  an unusual or allergic reaction to bupropion, other medicines, foods, dyes, or preservatives  breast-feeding  pregnant or trying to become pregnant How should I use this medicine? Take this medicine by mouth with a glass of water. Follow the directions on the prescription label. You can take it with or without food. If it upsets your stomach, take it with food. Do not crush, chew, or cut these tablets. This medicine is taken once daily at the same time each day. Do not take your medicine more often than directed. Do not stop taking this medicine suddenly except upon the advice of your doctor. Stopping this medicine too quickly may cause serious side effects or your condition may worsen. A special MedGuide will be given to you by the pharmacist with each prescription and refill. Be sure to read this information carefully each time. Talk to your pediatrician regarding the use of this medicine in children. Special care may be needed. Overdosage: If you think you have taken too much of this medicine contact a poison control center or emergency room at once. NOTE: This medicine is only for you. Do not share this medicine with others. What if I miss a dose? If you miss a dose, skip the missed dose and take your next tablet at the regular time. Do not take double or extra doses. What may interact with this medicine? Do not take this medicine with any of the  following medications:  linezolid  MAOIs like Azilect, Carbex, Eldepryl, Marplan, Nardil, and Parnate  methylene Rodak (injected into a vein)  other medicines that contain bupropion like Zyban This medicine may also interact with the following medications:  alcohol  certain medicines for anxiety or sleep  certain medicines for blood pressure like metoprolol, propranolol  certain medicines for depression or psychotic disturbances  certain medicines for HIV or AIDS like efavirenz, lopinavir, nelfinavir, ritonavir  certain medicines for irregular heart beat like propafenone, flecainide  certain medicines for Parkinson's disease like amantadine, levodopa  certain medicines for seizures like carbamazepine, phenytoin, phenobarbital  cimetidine  clopidogrel  cyclophosphamide  digoxin  furazolidone  isoniazid  nicotine  orphenadrine  procarbazine  steroid medicines like prednisone or cortisone  stimulant medicines for attention disorders, weight loss, or to stay awake  tamoxifen  theophylline  thiotepa  ticlopidine  tramadol  warfarin This list may not describe all possible interactions. Give your health care provider a list of all the medicines, herbs, non-prescription drugs, or dietary supplements you use. Also tell them if you smoke, drink alcohol, or use illegal drugs. Some items may interact with your medicine. What should I watch for while using this medicine? Tell your doctor if your symptoms do not get better or if they get worse. Visit your doctor or healthcare provider for regular checks on your progress. Because it may take several weeks to see the full effects of this medicine, it is important to continue your treatment as prescribed by your doctor. This medicine may cause serious skin reactions. They can happen weeks to months after starting the medicine. Contact your healthcare provider right away if you notice fevers or flu-like symptoms with a  rash. The rash may be red or purple and then turn into blisters or peeling of the  skin. Or, you might notice a red rash with swelling of the face, lips or lymph nodes in your neck or under your arms. Patients and their families should watch out for new or worsening thoughts of suicide or depression. Also watch out for sudden changes in feelings such as feeling anxious, agitated, panicky, irritable, hostile, aggressive, impulsive, severely restless, overly excited and hyperactive, or not being able to sleep. If this happens, especially at the beginning of treatment or after a change in dose, call your healthcare provider. Avoid alcoholic drinks while taking this medicine. Drinking large amounts of alcoholic beverages, using sleeping or anxiety medicines, or quickly stopping the use of these agents while taking this medicine may increase your risk for a seizure. Do not drive or use heavy machinery until you know how this medicine affects you. This medicine can impair your ability to perform these tasks. Do not take this medicine close to bedtime. It may prevent you from sleeping. Your mouth may get dry. Chewing sugarless gum or sucking hard candy, and drinking plenty of water may help. Contact your doctor if the problem does not go away or is severe. The tablet shell for some brands of this medicine does not dissolve. This is normal. The tablet shell may appear whole in the stool. This is not a cause for concern. What side effects may I notice from receiving this medicine? Side effects that you should report to your doctor or health care professional as soon as possible:  allergic reactions like skin rash, itching or hives, swelling of the face, lips, or tongue  breathing problems  changes in vision  confusion  elevated mood, decreased need for sleep, racing thoughts, impulsive behavior  fast or irregular heartbeat  hallucinations, loss of contact with reality  increased blood pressure  rash,  fever, and swollen lymph nodes  redness, blistering, peeling or loosening of the skin, including inside the mouth  seizures  suicidal thoughts or other mood changes  unusually weak or tired  vomiting Side effects that usually do not require medical attention (report to your doctor or health care professional if they continue or are bothersome):  constipation  headache  loss of appetite  nausea  tremors  weight loss This list may not describe all possible side effects. Call your doctor for medical advice about side effects. You may report side effects to FDA at 1-800-FDA-1088. Where should I keep my medicine? Keep out of the reach of children. Store at room temperature between 15 and 30 degrees C (59 and 86 degrees F). Throw away any unused medicine after the expiration date. NOTE: This sheet is a summary. It may not cover all possible information. If you have questions about this medicine, talk to your doctor, pharmacist, or health care provider.  2020 [employer] (2019-01-09 13:45:31)

## 2020-09-28 LAB — CBC WITH DIFFERENTIAL/PLATELET
Basophils Absolute: 0.1 10*3/uL (ref 0.0–0.2)
Basos: 0 %
EOS (ABSOLUTE): 0.3 10*3/uL (ref 0.0–0.4)
Eos: 2 %
Hematocrit: 39.9 % (ref 34.0–46.6)
Hemoglobin: 13.3 g/dL (ref 11.1–15.9)
Immature Grans (Abs): 0 10*3/uL (ref 0.0–0.1)
Immature Granulocytes: 0 %
Lymphocytes Absolute: 4.7 10*3/uL — ABNORMAL HIGH (ref 0.7–3.1)
Lymphs: 33 %
MCH: 27.1 pg (ref 26.6–33.0)
MCHC: 33.3 g/dL (ref 31.5–35.7)
MCV: 81 fL (ref 79–97)
Monocytes Absolute: 0.9 10*3/uL (ref 0.1–0.9)
Monocytes: 7 %
Neutrophils Absolute: 8.2 10*3/uL — ABNORMAL HIGH (ref 1.4–7.0)
Neutrophils: 58 %
Platelets: 336 10*3/uL (ref 150–450)
RBC: 4.9 x10E6/uL (ref 3.77–5.28)
RDW: 14.2 % (ref 11.7–15.4)
WBC: 14.2 10*3/uL — ABNORMAL HIGH (ref 3.4–10.8)

## 2020-09-28 LAB — IRON AND TIBC
Iron Saturation: 16 % (ref 15–55)
Iron: 54 ug/dL (ref 27–159)
Total Iron Binding Capacity: 334 ug/dL (ref 250–450)
UIBC: 280 ug/dL (ref 131–425)

## 2020-09-28 LAB — VITAMIN B12: Vitamin B-12: 244 pg/mL (ref 232–1245)

## 2020-09-28 LAB — VITAMIN D 25 HYDROXY (VIT D DEFICIENCY, FRACTURES): Vit D, 25-Hydroxy: 7.5 ng/mL — ABNORMAL LOW (ref 30.0–100.0)

## 2020-09-28 MED FILL — FUROSEMIDE 20 MG TABS: 20 | 30 days supply | Qty: 30 | Fill #0

## 2020-09-29 DIAGNOSIS — E559 Vitamin D deficiency, unspecified: Secondary | ICD-10-CM

## 2020-09-29 HISTORY — DX: Vitamin D deficiency, unspecified: E55.9

## 2020-10-01 ENCOUNTER — Other Ambulatory Visit: Payer: Self-pay

## 2020-10-01 ENCOUNTER — Emergency Department (HOSPITAL_COMMUNITY)
Admission: EM | Admit: 2020-10-01 | Discharge: 2020-10-01 | Disposition: A | Discharge status: Home / Self Care | Payer: No Typology Code available for payment source | Attending: Emergency Medicine | Admitting: Emergency Medicine

## 2020-10-01 ENCOUNTER — Encounter (HOSPITAL_COMMUNITY): Payer: Self-pay

## 2020-10-01 DIAGNOSIS — Z1152 Encounter for screening for COVID-19: Secondary | ICD-10-CM | POA: Insufficient documentation

## 2020-10-01 DIAGNOSIS — Z5321 Procedure and treatment not carried out due to patient leaving prior to being seen by health care provider: Secondary | ICD-10-CM | POA: Insufficient documentation

## 2020-10-01 NOTE — ED Notes (Signed)
Pt stated she going to another testing site

## 2020-10-01 NOTE — ED Triage Notes (Signed)
Pt requesting covid test due to exposure from son. States she is full vaccinated, denies any symptoms.

## 2020-10-04 ENCOUNTER — Encounter: Payer: Self-pay | Admitting: Family Medicine

## 2020-10-04 ENCOUNTER — Other Ambulatory Visit: Payer: Self-pay | Admitting: Family Medicine

## 2020-10-04 DIAGNOSIS — E559 Vitamin D deficiency, unspecified: Secondary | ICD-10-CM

## 2020-10-04 MED ORDER — VITAMIN D (ERGOCALCIFEROL) 1.25 MG (50000 UNIT) PO CAPS: 50000.0000 [IU] | ORAL_CAPSULE | ORAL | 6 refills | Status: DC

## 2020-10-04 MED FILL — VIT D2 1.25 MG (50,000 UNIT: 1.25 MG | 35 days supply | Qty: 5 | Fill #0

## 2020-10-06 NOTE — Progress Notes (Signed)
Lab result letter sent via MyChart

## 2020-12-27 ENCOUNTER — Ambulatory Visit: Payer: Self-pay | Admitting: Family Medicine

## 2020-12-27 ENCOUNTER — Telehealth: Payer: Self-pay

## 2020-12-27 NOTE — Telephone Encounter (Signed)
V/m left for pt to c/b and r/s missed appt

## 2021-02-09 IMAGING — CR DG KNEE COMPLETE 4+V*R*
4 series · 4 of 4 positions shown · non-contrast
Comparison: None.

CLINICAL DATA: Fall, knee pain.

EXAM:
RIGHT KNEE - COMPLETE 4+ VIEW

[knee obl (1 of 2)]
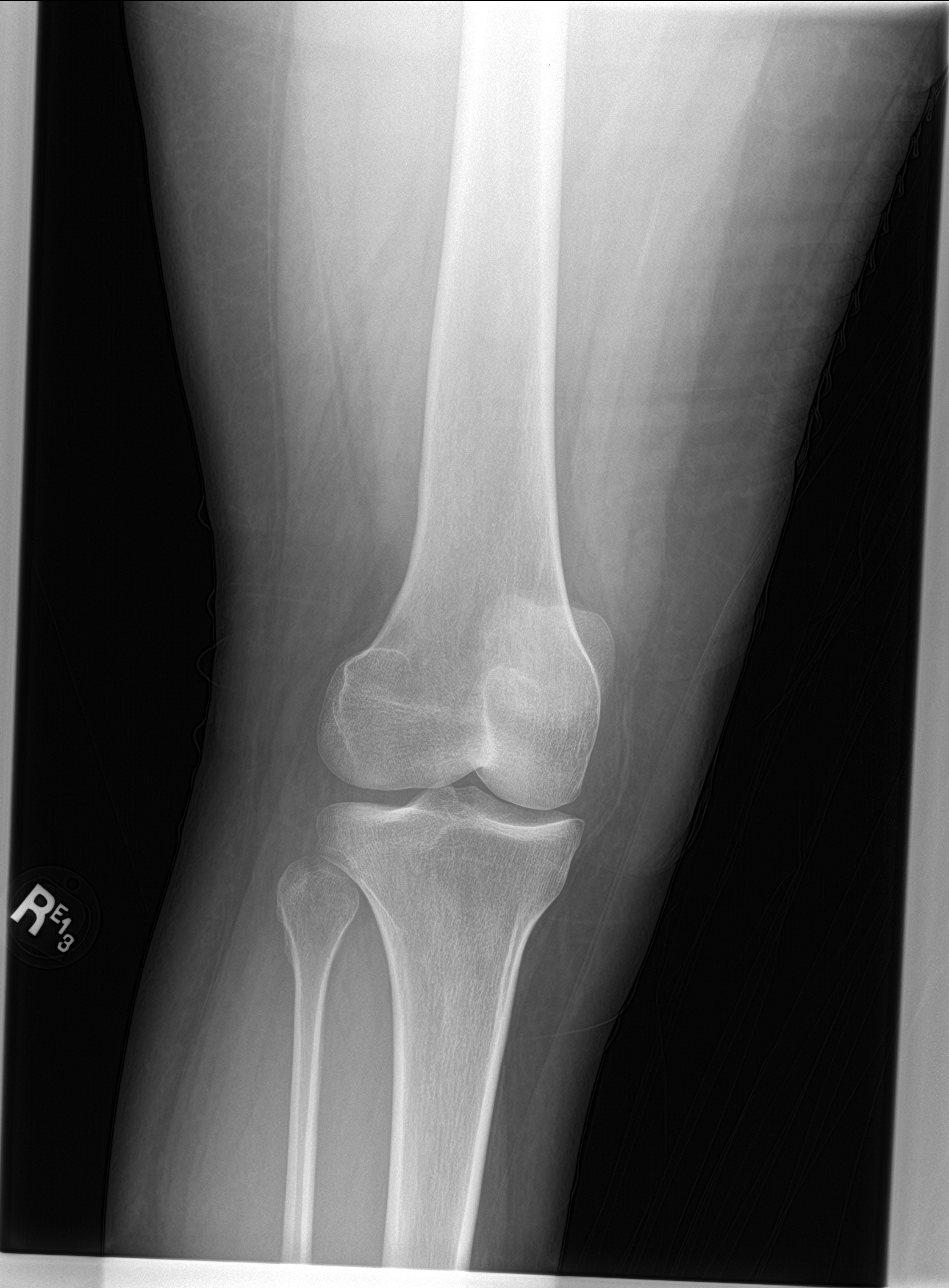

[knee lat]
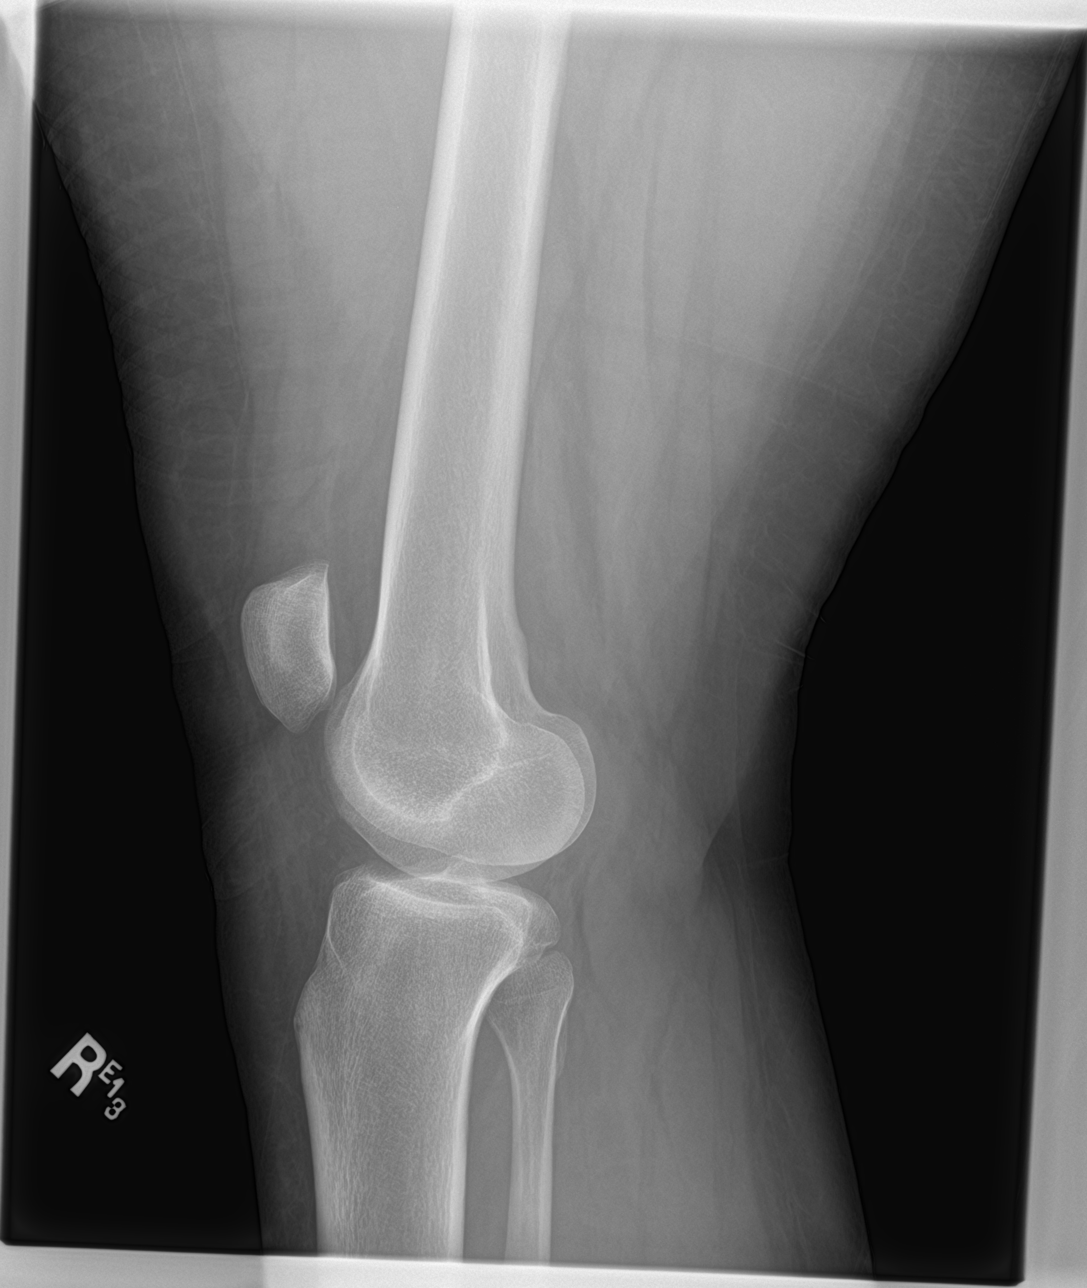

[knee ap]
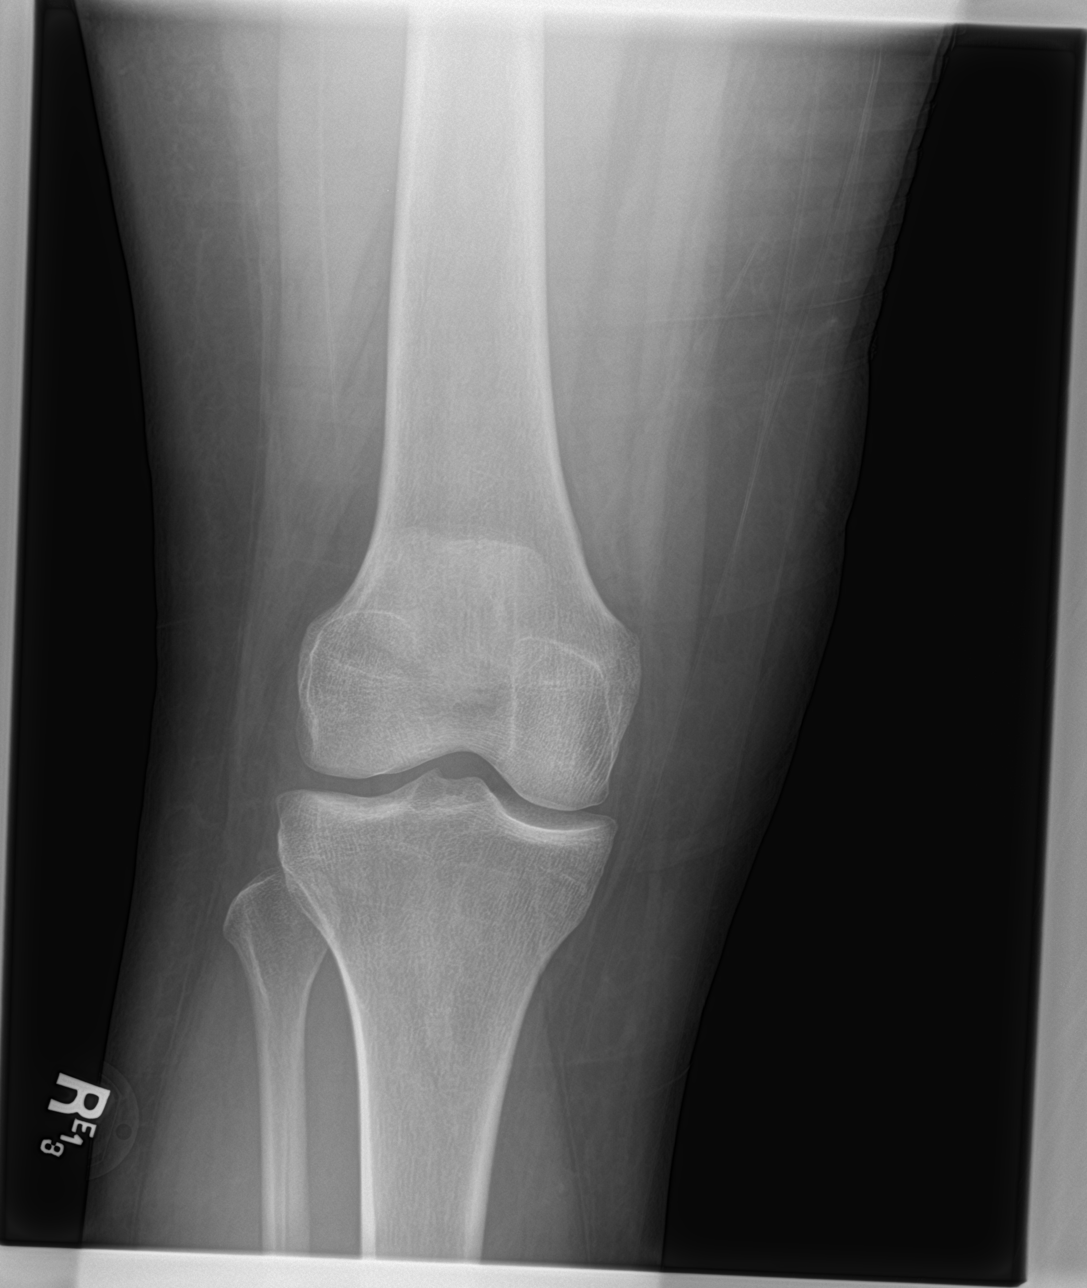

[knee obl (2 of 2)]
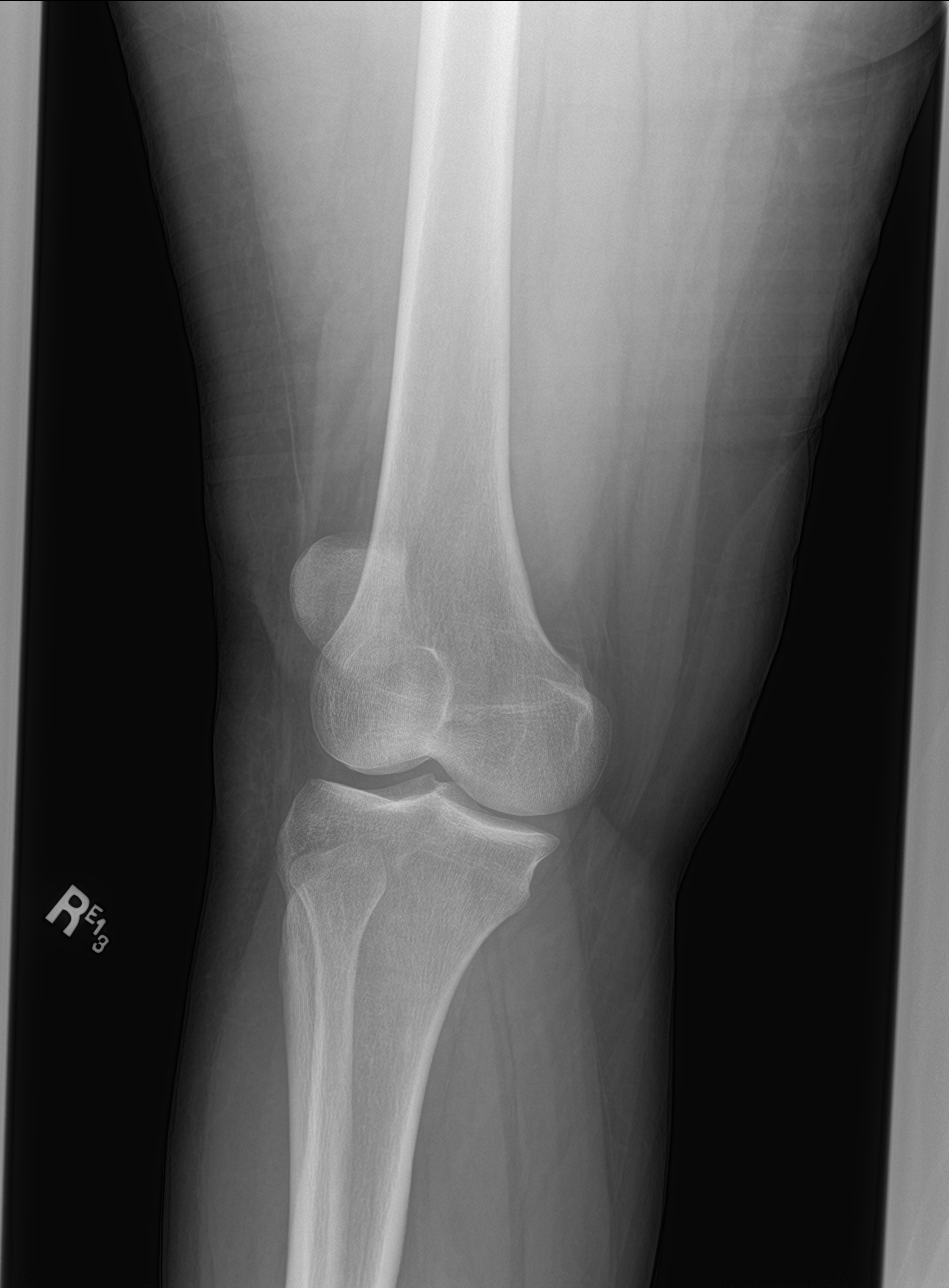

[4 of 4 positions shown; findings below may reference images not displayed]

FINDINGS: No evidence of fracture, dislocation or joint effusion. No evidence
of arthropathy or other focal bone abnormality. Soft tissues are
unremarkable.
IMPRESSION: No acute osseous abnormality.

## 2021-06-21 ENCOUNTER — Emergency Department (HOSPITAL_COMMUNITY)
Admission: EM | Admit: 2021-06-21 | Discharge: 2021-06-21 | Disposition: A | Discharge status: Home / Self Care | Payer: No Typology Code available for payment source | Attending: Emergency Medicine | Admitting: Emergency Medicine

## 2021-06-21 ENCOUNTER — Encounter (HOSPITAL_COMMUNITY): Payer: Self-pay | Admitting: Emergency Medicine

## 2021-06-21 DIAGNOSIS — R197 Diarrhea, unspecified: Secondary | ICD-10-CM | POA: Insufficient documentation

## 2021-06-21 DIAGNOSIS — R110 Nausea: Secondary | ICD-10-CM | POA: Insufficient documentation

## 2021-06-21 DIAGNOSIS — R519 Headache, unspecified: Secondary | ICD-10-CM | POA: Insufficient documentation

## 2021-06-21 DIAGNOSIS — Z5321 Procedure and treatment not carried out due to patient leaving prior to being seen by health care provider: Secondary | ICD-10-CM | POA: Insufficient documentation

## 2021-06-21 NOTE — ED Provider Notes (Signed)
Emergency Medicine Provider Triage Evaluation Note  Punam Broussard , a 35 y.o. female  was evaluated in triage.  Pt complains of feeling like her taste buds have been "feeling off" for 6 to 7 days.  She states that today when she got up she worsened.  She reports upper body body aches, nausea, she did have 1 episode of diarrhea.  No abdominal pain.  Review of Systems  Positive: Headache, upper body body aches, Negative: Coughing, shortness of breath, chest pain  Physical Exam  BP (!) 139/101 (BP Location: Left Arm)   Pulse 72   Temp 98.4 F (36.9 C) (Oral)   Resp 16   SpO2 99%  Gen:   Awake, no distress   Resp:  Normal effort  MSK:   Moves extremities without difficulty  Other:  Normal gait.  Medical Decision Making  Medically screening exam initiated at 12:26 PM.  Appropriate orders placed.  Nuriyah Tamm was informed that the remainder of the evaluation will be completed by another provider, this initial triage assessment does not replace that evaluation, and the importance of remaining in the ED until their evaluation is complete.  Note: Portions of this report may have been transcribed using voice recognition software. Every effort was made to ensure accuracy; however, inadvertent computerized transcription errors may be present    Norman Clay 06/21/21 1228    Rolan Bucco, MD 06/21/21 1451

## 2021-06-21 NOTE — ED Triage Notes (Signed)
Pt here with cv\ovid type symptoms ( h/a )was tested at [facility] yesterday waiting on results

## 2021-06-21 NOTE — ED Notes (Signed)
Pt stated she is leaving and walked out

## 2021-08-17 ENCOUNTER — Other Ambulatory Visit (HOSPITAL_COMMUNITY): Payer: Self-pay

## 2021-08-17 ENCOUNTER — Other Ambulatory Visit: Payer: Self-pay

## 2021-08-17 ENCOUNTER — Encounter: Payer: Self-pay | Admitting: Nurse Practitioner

## 2021-08-17 ENCOUNTER — Ambulatory Visit (INDEPENDENT_AMBULATORY_CARE_PROVIDER_SITE_OTHER): Payer: 59 | Admitting: Nurse Practitioner

## 2021-08-17 VITALS — BP 138/94 | HR 83 | Temp 98.2°F | Ht 65.0 in | Wt 296.8 lb

## 2021-08-17 DIAGNOSIS — R7303 Prediabetes: Secondary | ICD-10-CM | POA: Diagnosis not present

## 2021-08-17 DIAGNOSIS — E119 Type 2 diabetes mellitus without complications: Secondary | ICD-10-CM

## 2021-08-17 DIAGNOSIS — L732 Hidradenitis suppurativa: Secondary | ICD-10-CM

## 2021-08-17 DIAGNOSIS — Z23 Encounter for immunization: Secondary | ICD-10-CM

## 2021-08-17 DIAGNOSIS — Z6841 Body Mass Index (BMI) 40.0 and over, adult: Secondary | ICD-10-CM

## 2021-08-17 DIAGNOSIS — R051 Acute cough: Secondary | ICD-10-CM

## 2021-08-17 DIAGNOSIS — R350 Frequency of micturition: Secondary | ICD-10-CM | POA: Diagnosis not present

## 2021-08-17 LAB — POCT URINALYSIS DIP (CLINITEK)
Bilirubin, UA: NEGATIVE
Glucose, UA: NEGATIVE mg/dL
Ketones, POC UA: NEGATIVE mg/dL
Leukocytes, UA: NEGATIVE
Nitrite, UA: NEGATIVE
POC PROTEIN,UA: NEGATIVE
Spec Grav, UA: 1.025 (ref 1.010–1.025)
Urobilinogen, UA: 0.2 E.U./dL
pH, UA: 5.5 (ref 5.0–8.0)

## 2021-08-17 LAB — POCT GLYCOSYLATED HEMOGLOBIN (HGB A1C)
HbA1c POC (<> result, manual entry): 6.5 % (ref 4.0–5.6)
HbA1c, POC (controlled diabetic range): 6.5 % (ref 0.0–7.0)
HbA1c, POC (prediabetic range): 6.5 % — AB (ref 5.7–6.4)
Hemoglobin A1C: 6.5 % — AB (ref 4.0–5.6)

## 2021-08-17 MED ORDER — FREESTYLE LANCETS MISC
0 refills | Status: AC
  Filled 2021-08-17: qty 100, 25d supply, fill #0

## 2021-08-17 MED ORDER — GLUCOSE BLOOD VI STRP
ORAL_STRIP | 0 refills | Status: AC
  Filled 2021-08-17: qty 100, 25d supply, fill #0

## 2021-08-17 MED ORDER — AMOXICILLIN-POT CLAVULANATE 875-125 MG PO TABS
1.0000 | ORAL_TABLET | Freq: Two times a day (BID) | ORAL | 0 refills | Status: DC
  Filled 2021-08-17: qty 20, 10d supply, fill #0

## 2021-08-17 MED ORDER — FLUTICASONE PROPIONATE 50 MCG/ACT NA SUSP
2.0000 | Freq: Every day | NASAL | 6 refills | Status: DC
  Filled 2021-08-17 (×2): qty 16, 30d supply, fill #0

## 2021-08-17 MED ORDER — BENZONATATE 100 MG PO CAPS
100.0000 mg | ORAL_CAPSULE | Freq: Three times a day (TID) | ORAL | 0 refills | Status: DC | PRN
Start: 2021-08-17 — End: 2021-08-17
  Filled 2021-08-17: qty 30, 10d supply, fill #0

## 2021-08-17 MED ORDER — HYDROCODONE BIT-HOMATROP MBR 5-1.5 MG/5ML PO SOLN
5.0000 mL | Freq: Three times a day (TID) | ORAL | 0 refills | Status: AC | PRN
  Filled 2021-08-17: qty 120, 8d supply, fill #0

## 2021-08-17 MED ORDER — BLOOD GLUCOSE MONITOR SYSTEM W/DEVICE KIT
PACK | 0 refills | Status: AC
  Filled 2021-08-17: qty 1, 1d supply, fill #0

## 2021-08-17 NOTE — Patient Instructions (Addendum)
Health Maintenance, Female Adopting a healthy lifestyle and getting preventive care are important in promoting health and wellness. Ask your health care provider about: The right schedule for you to have regular tests and exams. Things you can do on your own to prevent diseases and keep yourself healthy. What should I know about diet, weight, and exercise? Eat a healthy diet  Eat a diet that includes plenty of vegetables, fruits, low-fat dairy products, and lean protein. Do not eat a lot of foods that are high in solid fats, added sugars, or sodium. Maintain a healthy weight Body mass index (BMI) is used to identify weight problems. It estimates body fat based on height and weight. Your health care provider can help determine your BMI and help you achieve or maintain a healthy weight. Get regular exercise Get regular exercise. This is one of the most important things you can do for your health. Most adults should: Exercise for at least 150 minutes each week. The exercise should increase your heart rate and make you sweat (moderate-intensity exercise). Do strengthening exercises at least twice a week. This is in addition to the moderate-intensity exercise. Spend less time sitting. Even light physical activity can be beneficial. Watch cholesterol and blood lipids Have your blood tested for lipids and cholesterol at 36 years of age, then have this test every 5 years. Have your cholesterol levels checked more often if: Your lipid or cholesterol levels are high. You are older than 36 years of age. You are at high risk for heart disease. What should I know about cancer screening? Depending on your health history and family history, you may need to have cancer screening at various ages. This may include screening for: Breast cancer. Cervical cancer. Colorectal cancer. Skin cancer. Lung cancer. What should I know about heart disease, diabetes, and high blood pressure? Blood pressure and heart  disease High blood pressure causes heart disease and increases the risk of stroke. This is more likely to develop in people who have high blood pressure readings, are of African descent, or are overweight. Have your blood pressure checked: Every 3-5 years if you are 18-39 years of age. Every year if you are 40 years old or older. Diabetes Have regular diabetes screenings. This checks your fasting blood sugar level. Have the screening done: Once every three years after age 40 if you are at a normal weight and have a low risk for diabetes. More often and at a younger age if you are overweight or have a high risk for diabetes. What should I know about preventing infection? Hepatitis B If you have a higher risk for hepatitis B, you should be screened for this virus. Talk with your health care provider to find out if you are at risk for hepatitis B infection. Hepatitis C Testing is recommended for: Everyone born from 1945 through 1965. Anyone with known risk factors for hepatitis C. Sexually transmitted infections (STIs) Get screened for STIs, including gonorrhea and chlamydia, if: You are sexually active and are younger than 36 years of age. You are older than 36 years of age and your health care provider tells you that you are at risk for this type of infection. Your sexual activity has changed since you were last screened, and you are at increased risk for chlamydia or gonorrhea. Ask your health care provider if you are at risk. Ask your health care provider about whether you are at high risk for HIV. Your health care provider may recommend a prescription medicine   to help prevent HIV infection. If you choose to take medicine to prevent HIV, you should first get tested for HIV. You should then be tested every 3 months for as long as you are taking the medicine. Pregnancy If you are about to stop having your period (premenopausal) and you may become pregnant, seek counseling before you get  pregnant. Take 400 to 800 micrograms (mcg) of folic acid every day if you become pregnant. Ask for birth control (contraception) if you want to prevent pregnancy. Osteoporosis and menopause Osteoporosis is a disease in which the bones lose minerals and strength with aging. This can result in bone fractures. If you are 15 years old or older, or if you are at risk for osteoporosis and fractures, ask your health care provider if you should: Be screened for bone loss. Take a calcium or vitamin D supplement to lower your risk of fractures. Be given hormone replacement therapy (HRT) to treat symptoms of menopause. Follow these instructions at home: Lifestyle Do not use any products that contain nicotine or tobacco, such as cigarettes, e-cigarettes, and chewing tobacco. If you need help quitting, ask your health care provider. Do not use street drugs. Do not share needles. Ask your health care provider for help if you need support or information about quitting drugs. Alcohol use Do not drink alcohol if: Your health care provider tells you not to drink. You are pregnant, may be pregnant, or are planning to become pregnant. If you drink alcohol: Limit how much you use to 0-1 drink a day. Limit intake if you are breastfeeding. Be aware of how much alcohol is in your drink. In the [place] one drink equals one 12 oz bottle of beer (355 mL), one 5 oz glass of wine (148 mL), or one 1 oz glass of hard liquor (44 mL). General instructions Schedule regular health, dental, and eye exams. Stay current with your vaccines. Tell your health care provider if: You often feel depressed. You have ever been abused or do not feel safe at home. Summary Adopting a healthy lifestyle and getting preventive care are important in promoting health and wellness. Follow your health care provider's instructions about healthy diet, exercising, and getting tested or screened for diseases. Follow your health care provider's  instructions on monitoring your cholesterol and blood pressure. This information is not intended to replace advice given to you by your health care provider. Make sure you discuss any questions you have with your health care provider. Document Revised: 12/24/2020 Document Reviewed: 10/09/2018 Elsevier Patient Education  2022 Elsevier Inc.  Diabetes Mellitus and Nutrition, Adult When you have diabetes, or diabetes mellitus, it is very important to have healthy eating habits because your blood sugar (glucose) levels are greatly affected by what you eat and drink. Eating healthy foods in the right amounts, at about the same times every day, can help you: Control your blood glucose. Lower your risk of heart disease. Improve your blood pressure. Reach or maintain a healthy weight. What can affect my meal plan? Every person with diabetes is different, and each person has different needs for a meal plan. Your health care provider may recommend that you work with a dietitian to make a meal plan that is best for you. Your meal plan may vary depending on factors such as: The calories you need. The medicines you take. Your weight. Your blood glucose, blood pressure, and cholesterol levels. Your activity level. Other health conditions you have, such as heart or kidney disease. How do  carbohydrates affect me? Carbohydrates, also called carbs, affect your blood glucose level more than any other type of food. Eating carbs naturally raises the amount of glucose in your blood. Carb counting is a method for keeping track of how many carbs you eat. Counting carbs is important to keep your blood glucose at a healthy level, especially if you use insulin or take certain oral diabetes medicines. It is important to know how many carbs you can safely have in each meal. This is different for every person. Your dietitian can help you calculate how many carbs you should have at each meal and for each snack. How does  alcohol affect me? Alcohol can cause a sudden decrease in blood glucose (hypoglycemia), especially if you use insulin or take certain oral diabetes medicines. Hypoglycemia can be a life-threatening condition. Symptoms of hypoglycemia, such as sleepiness, dizziness, and confusion, are similar to symptoms of having too much alcohol. Do not drink alcohol if: Your health care provider tells you not to drink. You are pregnant, may be pregnant, or are planning to become pregnant. If you drink alcohol: Do not drink on an empty stomach. Limit how much you use to: 0-1 drink a day for women. 0-2 drinks a day for men. Be aware of how much alcohol is in your drink. In the [place] one drink equals one 12 oz bottle of beer (355 mL), one 5 oz glass of wine (148 mL), or one 1 oz glass of hard liquor (44 mL). Keep yourself hydrated with water, diet soda, or unsweetened iced tea. Keep in mind that regular soda, juice, and other mixers may contain a lot of sugar and must be counted as carbs. What are tips for following this plan? Reading food labels Start by checking the serving size on the "Nutrition Facts" label of packaged foods and drinks. The amount of calories, carbs, fats, and other nutrients listed on the label is based on one serving of the item. Many items contain more than one serving per package. Check the total grams (g) of carbs in one serving. You can calculate the number of servings of carbs in one serving by dividing the total carbs by 15. For example, if a food has 30 g of total carbs per serving, it would be equal to 2 servings of carbs. Check the number of grams (g) of saturated fats and trans fats in one serving. Choose foods that have a low amount or none of these fats. Check the number of milligrams (mg) of salt (sodium) in one serving. Most people should limit total sodium intake to less than 2,300 mg per day. Always check the nutrition information of foods labeled as "low-fat" or "nonfat."  These foods may be higher in added sugar or refined carbs and should be avoided. Talk to your dietitian to identify your daily goals for nutrients listed on the label. Shopping Avoid buying canned, pre-made, or processed foods. These foods tend to be high in fat, sodium, and added sugar. Shop around the outside edge of the grocery store. This is where you will most often find fresh fruits and vegetables, bulk grains, fresh meats, and fresh dairy. Cooking Use low-heat cooking methods, such as baking, instead of high-heat cooking methods like deep frying. Cook using healthy oils, such as olive, canola, or sunflower oil. Avoid cooking with butter, cream, or high-fat meats. Meal planning Eat meals and snacks regularly, preferably at the same times every day. Avoid going long periods of time without eating. Eat foods that  are high in fiber, such as fresh fruits, vegetables, beans, and whole grains. Talk with your dietitian about how many servings of carbs you can eat at each meal. Eat 4-6 oz (112-168 g) of lean protein each day, such as lean meat, chicken, fish, eggs, or tofu. One ounce (oz) of lean protein is equal to: 1 oz (28 g) of meat, chicken, or fish. 1 egg.  cup (62 g) of tofu. Eat some foods each day that contain healthy fats, such as avocado, nuts, seeds, and fish. What foods should I eat? Fruits Berries. Apples. Oranges. Peaches. Apricots. Plums. Grapes. Mango. Papaya. Pomegranate. Kiwi. Cherries. Vegetables Lettuce. Spinach. Leafy greens, including kale, chard, collard greens, and mustard greens. Beets. Cauliflower. Cabbage. Broccoli. Carrots. Green beans. Tomatoes. Peppers. Onions. Cucumbers. Brussels sprouts. Grains Whole grains, such as whole-wheat or whole-grain bread, crackers, tortillas, cereal, and pasta. Unsweetened oatmeal. Quinoa. Brown or wild rice. Meats and other proteins Seafood. Poultry without skin. Lean cuts of poultry and beef. Tofu. Nuts. Seeds. Dairy Low-fat or  fat-free dairy products such as milk, yogurt, and cheese. The items listed above may not be a complete list of foods and beverages you can eat. Contact a dietitian for more information. What foods should I avoid? Fruits Fruits canned with syrup. Vegetables Canned vegetables. Frozen vegetables with butter or cream sauce. Grains Refined white flour and flour products such as bread, pasta, snack foods, and cereals. Avoid all processed foods. Meats and other proteins Fatty cuts of meat. Poultry with skin. Breaded or fried meats. Processed meat. Avoid saturated fats. Dairy Full-fat yogurt, cheese, or milk. Beverages Sweetened drinks, such as soda or iced tea. The items listed above may not be a complete list of foods and beverages you should avoid. Contact a dietitian for more information. Questions to ask a health care provider Do I need to meet with a diabetes educator? Do I need to meet with a dietitian? What number can I call if I have questions? When are the best times to check my blood glucose? Where to find more information: American Diabetes Association: diabetes.org Academy of Nutrition and Dietetics: www.eatright.[employer] of Diabetes and Digestive and Kidney Diseases: CarFlippers.tn Association of Diabetes Care and Education Specialists: www.diabeteseducator.org Summary It is important to have healthy eating habits because your blood sugar (glucose) levels are greatly affected by what you eat and drink. A healthy meal plan will help you control your blood glucose and maintain a healthy lifestyle. Your health care provider may recommend that you work with a dietitian to make a meal plan that is best for you. Keep in mind that carbohydrates (carbs) and alcohol have immediate effects on your blood glucose levels. It is important to count carbs and to use alcohol carefully. This information is not intended to replace advice given to you by your health care provider.  Make sure you discuss any questions you have with your health care provider. Document Revised: 09/23/2019 Document Reviewed: 09/23/2019 Elsevier Patient Education  2021 Elsevier Inc.  Hidradenitis Suppurativa Hidradenitis suppurativa is a long-term (chronic) skin disease. It is similar to a severe form of acne, but it affects areas of the body where acne would be unusual, especially areas of the body where skin rubs against skin and becomes moist. These include: Underarms. Groin. Genital area. Buttocks. Upper thighs. Breasts. Hidradenitis suppurativa may start out as small lumps or pimples caused by blocked sweat glands or hair follicles. Pimples may develop into deep sores that break open (rupture) and drain pus. Over  time, affected areas of skin may thicken and become scarred. This condition is rare and does not spread from person to person (non-contagious). What are the causes? The exact cause of this condition is not known. It may be related to: Female and female hormones. An overactive disease-fighting system (immune system). The immune system may over-react to blocked hair follicles or sweat glands and cause swelling and pus-filled sores. What increases the risk? You are more likely to develop this condition if you: Are female. Are 42-11 years old. Have a family history of hidradenitis suppurativa. Have a personal history of acne. Are overweight. Smoke. Take the medicine lithium. What are the signs or symptoms? The first symptoms are usually painful bumps in the skin, similar to pimples. The condition may get worse over time (progress), or it may only cause mild symptoms. If the disease progresses, symptoms may include: Skin bumps getting bigger and growing deeper into the skin. Bumps rupturing and draining pus. Itchy, infected skin. Skin getting thicker and scarred. Tunnels under the skin (fistulas) where pus drains from a bump. Pain during daily activities, such as pain during  walking if your groin area is affected. Emotional problems, such as stress or depression. This condition may affect your appearance and your ability or willingness to wear certain clothes or do certain activities. How is this diagnosed? This condition is diagnosed by a health care provider who specializes in skin diseases (dermatologist). You may be diagnosed based on: Your symptoms and medical history. A physical exam. Testing a pus sample for infection. Blood tests. How is this treated? Your treatment will depend on how severe your symptoms are. The same treatment will not work for everybody with this condition. You may need to try several treatments to find what works best for you. Treatment may include: Cleaning and bandaging (dressing) your wounds as needed. Lifestyle changes, such as new skin care routines. Taking medicines, such as: Antibiotics. Acne medicines. Medicines to reduce the activity of the immune system. A diabetes medicine (metformin). Birth control pills, for women. Steroids to reduce swelling and pain. Working with a mental health care provider, if you experience emotional distress due to this condition. If you have severe symptoms that do not get better with medicine, you may need surgery. Surgery may involve: Using a laser to clear the skin and remove hair follicles. Opening and draining deep sores. Removing the areas of skin that are diseased and scarred. Follow these instructions at home: Medicines  Take over-the-counter and prescription medicines only as told by your health care provider. If you were prescribed an antibiotic medicine, take it as told by your health care provider. Do not stop taking the antibiotic even if your condition improves. Skin care If you have open wounds, cover them with a clean dressing as told by your health care provider. Keep wounds clean by washing them gently with soap and water when you bathe. Do not shave the areas where you  get hidradenitis suppurativa. Do not wear deodorant. Wear loose-fitting clothes. Try to avoid getting overheated or sweaty. If you get sweaty or wet, change into clean, dry clothes as soon as you can. To help relieve pain and itchiness, cover sore areas with a warm, clean washcloth (warm compress) for 5-10 minutes as often as needed. If told by your health care provider, take a bleach bath twice a week: Fill your bathtub halfway with water. Pour in  cup of unscented household bleach. Soak in the tub for 5-10 minutes. Only soak from  the neck down. Avoid water on your face and hair. Shower to rinse off the bleach from your skin. General instructions Learn as much as you can about your disease so that you have an active role in your treatment. Work closely with your health care provider to find treatments that work for you. If you are overweight, work with your health care provider to lose weight as recommended. Do not use any products that contain nicotine or tobacco, such as cigarettes and e-cigarettes. If you need help quitting, ask your health care provider. If you struggle with living with this condition, talk with your health care provider or work with a mental health care provider as recommended. Keep all follow-up visits as told by your health care provider. This is important. Where to find more information Hidradenitis Suppurativa Foundation, Inc.: https://www.hs-foundation.org/ American Academy of Dermatology: InstantFinish.fi Contact a health care provider if you have: A flare-up of hidradenitis suppurativa. A fever or chills. Trouble controlling your symptoms at home. Trouble doing your daily activities because of your symptoms. Trouble dealing with emotional problems related to your condition. Summary Hidradenitis suppurativa is a long-term (chronic) skin disease. It is similar to a severe form of acne, but it affects areas of the body where acne would be unusual. The first  symptoms are usually painful bumps in the skin, similar to pimples. The condition may only cause mild symptoms, or it may get worse over time (progress). If you have open wounds, cover them with a clean dressing as told by your health care provider. Keep wounds clean by washing them gently with soap and water when you bathe. Besides skin care, treatment may include medicines, laser treatment, and surgery. This information is not intended to replace advice given to you by your health care provider. Make sure you discuss any questions you have with your health care provider. Document Revised: 08/10/2020 Document Reviewed: 08/10/2020 Elsevier Patient Education  2022 Elsevier Inc. Diabetes Mellitus Basics Diabetes mellitus, or diabetes, is a long-term (chronic) disease. It occurs when the body does not properly use sugar (glucose) that is released from food after you eat. Diabetes mellitus may be caused by one or both of these problems: Your pancreas does not make enough of a hormone called insulin. Your body does not react in a normal way to the insulin that it makes. Insulin lets glucose enter cells in your body. This gives you energy. If you have diabetes, glucose cannot get into cells. This causes high blood glucose (hyperglycemia). How to treat and manage diabetes You may need to take insulin or other diabetes medicines daily to keep your glucose in balance. If you are prescribed insulin, you will learn how to give yourself insulin by injection. You may need to adjust the amount of insulin you take based on the foods that you eat. You will need to check your blood glucose levels using a glucose monitor as told by your health care provider. The readings can help determine if you have low or high blood glucose. Generally, you should have these blood glucose levels: Before meals (preprandial): 80-130 mg/dL (6.2-9.5 mmol/L). After meals (postprandial): below 180 mg/dL (10 mmol/L). Hemoglobin A1c  (HbA1c) level: less than 7%. Your health care provider will set treatment goals for you. Keep all follow-up visits. This is important. Follow these instructions at home: Diabetes medicines Take your diabetes medicines every day as told by your health care provider. List your diabetes medicines here: Name of medicine: ______________________________ Amount (dose): _______________ Time (a.m./p.m.): _______________  Notes: ___________________________________ Name of medicine: ______________________________ Amount (dose): _______________ Time (a.m./p.m.): _______________ Notes: ___________________________________ Name of medicine: ______________________________ Amount (dose): _______________ Time (a.m./p.m.): _______________ Notes: ___________________________________ Insulin If you use insulin, list the types of insulin you use here: Insulin type: ______________________________ Amount (dose): _______________ Time (a.m./p.m.): _______________Notes: ___________________________________ Insulin type: ______________________________ Amount (dose): _______________ Time (a.m./p.m.): _______________ Notes: ___________________________________ Insulin type: ______________________________ Amount (dose): _______________ Time (a.m./p.m.): _______________ Notes: ___________________________________ Insulin type: ______________________________ Amount (dose): _______________ Time (a.m./p.m.): _______________ Notes: ___________________________________ Insulin type: ______________________________ Amount (dose): _______________ Time (a.m./p.m.): _______________ Notes: ___________________________________ Managing blood glucose Check your blood glucose levels using a glucose monitor as told by your health care provider. Write down the times that you check your glucose levels here: Time: _______________ Notes: ___________________________________ Time: _______________ Notes: ___________________________________ Time:  _______________ Notes: ___________________________________ Time: _______________ Notes: ___________________________________ Time: _______________ Notes: ___________________________________ Time: _______________ Notes: ___________________________________  Low blood glucose Low blood glucose (hypoglycemia) is when glucose is at or below 70 mg/dL (3.9 mmol/L). Symptoms may include: Feeling: Hungry. Sweaty and clammy. Irritable or easily upset. Dizzy. Sleepy. Having: A fast heartbeat. A headache. A change in your vision. Numbness around the mouth, lips, or tongue. Having trouble with: Moving (coordination). Sleeping. Treating low blood glucose To treat low blood glucose, eat or drink something containing sugar right away. If you can think clearly and swallow safely, follow the 15:15 rule: Take 15 grams of a fast-acting carb (carbohydrate), as told by your health care provider. Some fast-acting carbs are: Glucose tablets: take 3-4 tablets. Hard candy: eat 3-5 pieces. Fruit juice: drink 4 oz (120 mL). Regular (not diet) soda: drink 4-6 oz (120-180 mL). Honey or sugar: eat 1 Tbsp (15 mL). Check your blood glucose levels 15 minutes after you take the carb. If your glucose is still at or below 70 mg/dL (3.9 mmol/L), take 15 grams of a carb again. If your glucose does not go above 70 mg/dL (3.9 mmol/L) after 3 tries, get help right away. After your glucose goes back to normal, eat a meal or a snack within 1 hour. Treating very low blood glucose If your glucose is at or below 54 mg/dL (3 mmol/L), you have very low blood glucose (severe hypoglycemia). This is an emergency. Do not wait to see if the symptoms will go away. Get medical help right away. Call your local emergency services (911 in the [place] Do not drive yourself to the hospital. Questions to ask your health care provider Should I talk with a diabetes educator? What equipment will I need to care for myself at home? What  diabetes medicines do I need? When should I take them? How often do I need to check my blood glucose levels? What number can I call if I have questions? When is my follow-up visit? Where can I find a support group for people with diabetes? Where to find more information American Diabetes Association: www.diabetes.org Association of Diabetes Care and Education Specialists: www.diabeteseducator.org Contact a health care provider if: Your blood glucose is at or above 240 mg/dL (27.5 mmol/L) for 2 days in a row. You have been sick or have had a fever for 2 days or more, and you are not getting better. You have any of these problems for more than 6 hours: You cannot eat or drink. You feel nauseous. You vomit. You have diarrhea. Get help right away if: Your blood glucose is lower than 54 mg/dL (3 mmol/L). You get confused. You have trouble thinking clearly. You have trouble breathing. These symptoms may represent  a serious problem that is an emergency. Do not wait to see if the symptoms will go away. Get medical help right away. Call your local emergency services (911 in the [place] Do not drive yourself to the hospital. Summary Diabetes mellitus is a chronic disease that occurs when the body does not properly use sugar (glucose) that is released from food after you eat. Take insulin and diabetes medicines as told. Check your blood glucose every day, as often as told. Keep all follow-up visits. This is important. This information is not intended to replace advice given to you by your health care provider. Make sure you discuss any questions you have with your health care provider. Document Revised: 02/17/2020 Document Reviewed: 02/17/2020 Elsevier Patient Education  2022 [employer].

## 2021-08-17 NOTE — Progress Notes (Signed)
[address], [place]  [address] Phone:  [phone]   Fax:  [phone]   Established Patient Office Visit  Subjective:  Patient ID: Ariana Rogers, female    DOB: 15-Jun-1986  Age: 35 y.o. MRN: [id number]  CC:  Chief Complaint  Patient presents with   Cough    Pt states that she was sick x 2 weeks ago with chest congestion.  Pt states that she still has this cough that want go away. Pt is having frequent urination with a foul odor and low back pains. Pt also states that she has boils on her inner thigh and buttock.     HPI Taryne Rodenbaugh presents for follow up. She  has a past medical history of Acute bronchitis, Cough (03/2019), GERD (gastroesophageal reflux disease) (03/2019), Left breast mass, Smoker, and Vitamin D deficiency (09/2020).   She is in today with multiple complaints. She has recurrent boils. She denies being diagnosed with hidradenitis. She does have prediabetes. She has had a 10 pound weight gain over the last year. She is aware that she needs to make some changes.    Past Medical History:  Diagnosis Date   Acute bronchitis    Cough 03/2019   GERD (gastroesophageal reflux disease) 03/2019   Left breast mass    Smoker    Vitamin D deficiency 09/2020    Past Surgical History:  Procedure Laterality Date   DENTAL SURGERY     TUBAL LIGATION  2 years ago    Family History  Problem Relation Age of Onset   Hypertension Mother    Arthritis Mother    Fibromyalgia Mother    Other Father        no health concern    Social History   Socioeconomic History   Marital status: Single    Spouse name: Not on file   Number of children: 4   Years of education: Not on file   Highest education level: Some college, no degree  Occupational History   Not on file  Tobacco Use   Smoking status: Former    Packs/day: 0.50    Types: Cigarettes    Quit date: 03/30/2021    Years since quitting: 0.3   Smokeless tobacco: Never   Vaping Use   Vaping Use: Never used  Substance and Sexual Activity   Alcohol use: Yes    Comment: occasionally   Drug use: Yes    Types: Marijuana    Comment: edibles   Sexual activity: Yes    Birth control/protection: Surgical  Other Topics Concern   Not on file  Social History Narrative   Not on file   Social Determinants of Health   Financial Resource Strain: Not on file  Food Insecurity: Not on file  Transportation Needs: Not on file  Physical Activity: Not on file  Stress: Not on file  Social Connections: Not on file  Intimate Partner Violence: Not on file    Outpatient Medications Prior to Visit  Medication Sig Dispense Refill   furosemide (LASIX) 20 MG tablet Take 1 tablet (20 mg total) by mouth daily. 30 tablet 3   naproxen (NAPROSYN) 500 MG tablet Take 1 tablet (500 mg total) by mouth 2 (two) times daily. 60 tablet 3   Vitamin D, Ergocalciferol, (DRISDOL) 1.25 MG (50000 UNIT) CAPS capsule Take 1 capsule (50,000 Units total) by mouth every 7 (seven) days. 5 capsule 6   buPROPion (WELLBUTRIN SR) 100 MG 12 hr  tablet Take 1 tablet (100 mg total) by mouth 2 (two) times daily. (Patient not taking: Reported on 08/17/2021) 60 tablet 3   No facility-administered medications prior to visit.    No Known Allergies  ROS Review of Systems  Respiratory:  Positive for cough, shortness of breath and wheezing. Negative for chest tightness.        Mucous;  Dexium and robitussin   Cardiovascular:  Negative for chest pain.  Genitourinary:  Positive for frequency and pelvic pain (occasional sharp pain). Negative for dysuria.       Strong odor   Skin:        Hives come and goes  She denies any changes diet, detergent or medication Last 5-10 minutes with no treatment  She has recurrent boils She has taken Keflex in the past which was effective.      Objective:    Physical Exam HENT:     Head: Normocephalic and atraumatic.     Nose: Nose normal.     Mouth/Throat:      Mouth: Mucous membranes are moist.  Cardiovascular:     Rate and Rhythm: Normal rate and regular rhythm.     Pulses: Normal pulses.     Heart sounds: Normal heart sounds.  Pulmonary:     Effort: Pulmonary effort is normal.     Breath sounds: Normal breath sounds.  Musculoskeletal:        General: Normal range of motion.     Cervical back: Normal range of motion.  Skin:    General: Skin is warm and dry.     Capillary Refill: Capillary refill takes less than 2 seconds.     Findings: Erythema present.     Comments: Hives   Neurological:     Mental Status: She is alert.  Psychiatric:        Mood and Affect: Mood normal.        Behavior: Behavior normal.        Thought Content: Thought content normal.        Judgment: Judgment normal.    BP (!) 138/94   Pulse 83   Temp 98.2 F (36.8 C)   Ht '5\' 5"'  (1.651 m)   Wt 296 lb 12.8 oz (134.6 kg)   SpO2 98%   BMI 49.39 kg/m  Wt Readings from Last 3 Encounters:  08/17/21 296 lb 12.8 oz (134.6 kg)  09/27/20 288 lb 3.2 oz (130.7 kg)  08/26/20 286 lb (129.7 kg)     Health Maintenance Due  Topic Date Due   PAP SMEAR-Modifier  07/23/2021    There are no preventive care reminders to display for this patient.  Lab Results  Component Value Date   TSH 1.170 04/21/2019   Lab Results  Component Value Date   WBC 14.2 (H) 09/27/2020   HGB 13.3 09/27/2020   HCT 39.9 09/27/2020   MCV 81 09/27/2020   PLT 336 09/27/2020   Lab Results  Component Value Date   NA 138 08/22/2020   K 3.8 08/22/2020   CO2 20 (L) 08/22/2020   GLUCOSE 108 (H) 08/22/2020   BUN 13 08/22/2020   CREATININE 0.73 08/22/2020   BILITOT 0.8 08/22/2020   ALKPHOS 59 08/22/2020   AST 17 08/22/2020   ALT 23 08/22/2020   PROT 6.6 08/22/2020   ALBUMIN 3.6 08/22/2020   CALCIUM 8.5 (L) 08/22/2020   ANIONGAP 9 08/22/2020   Lab Results  Component Value Date   CHOL 155 04/21/2019   Lab Results  Component Value Date   HDL 39 (L) 04/21/2019   Lab Results   Component Value Date   LDLCALC 98 04/21/2019   Lab Results  Component Value Date   TRIG 91 04/21/2019   Lab Results  Component Value Date   CHOLHDL 4.0 04/21/2019   Lab Results  Component Value Date   HGBA1C 6.5 (A) 08/17/2021   HGBA1C 6.5 08/17/2021   HGBA1C 6.5 (A) 08/17/2021   HGBA1C 6.5 08/17/2021      Assessment & Plan:   Problem List Items Addressed This Visit       Other   Prediabetes Resolved  A1c 6.5%   Relevant Orders   HgB A1c (Completed)   Cough Persistent Additional evaluation due to history of bronchitis   Relevant Orders   DG Chest 2 View   Other Visit Diagnoses     Frequent urination    -  Primary Empirical anbx treatment due to symptoms and history.  Urine culture pending Encourage completion of anbx even when symptoms improve.  Discussed resistance with anbx overuse Discussed allergic reactions with anbx.  Encourage increasing hydration with water and how to tell when this is achieved Add cranberry juice 100% 8-16 ozs daily until symptoms improve Discussed hygiene  Avoid not voiding when the urge presents    Relevant Orders   POCT URINALYSIS DIP (CLINITEK) (Completed)   Urine Culture   Morbid obesity with BMI of 45.0-49.9, adult ([place])     Obesity with BMI and comorbidities as noted above.  Discussed proper diet (low fat, low sodium, high fiber) with patient.   Discussed need for regular exercise (3 times per week, 20 minutes per session) with patient.    Hidradenitis       Diabetes mellitus, new onset ([place]) A1c 6.5% Encourage regular CBG monitoring Encourage contacting office with hyperglycemia >150 Lifestyle modification with healthy diet (fewer calories, more high fiber foods, whole grains and non-starchy vegetables, lower fat meat and fish, low-fat diary include healthy oils) regular exercise (physical activity) and weight loss Opthalmology exam discussed  Nutritional consult recommended Regular dental visits encouraged Home BP  monitoring also encouraged goal <130/80 Antihypertensive and statin to be added        Meds ordered this encounter  Medications   DISCONTD: benzonatate (TESSALON) 100 MG capsule    Sig: Take 1 capsule (100 mg total) by mouth 3 (three) times daily as needed for up to 10 days for cough. Never suck or chew on a benzonatate capsule.    Dispense:  30 capsule    Refill:  0    Do not place medication on "Automatic Refill". Please provide patient with medication counseling and recommendations.    Order Specific Question:   Supervising Provider    Answer:   Tresa Garter [[id number]]   fluticasone (FLONASE) 50 MCG/ACT nasal spray    Sig: Place 2 sprays into both nostrils daily.    Dispense:  16 g    Refill:  6    Order Specific Question:   Supervising Provider    Answer:   Tresa Garter [[id number]]   amoxicillin-clavulanate (AUGMENTIN) 875-125 MG tablet    Sig: Take 1 tablet by mouth 2 (two) times daily.    Dispense:  20 tablet    Refill:  0    Order Specific Question:   Supervising Provider    Answer:   Tresa Garter [id number]   Blood Glucose Monitoring Suppl (BLOOD GLUCOSE MONITOR SYSTEM) w/Device KIT    Sig: Use  up to 4 (four) times daily as directed.    Dispense:  1 kit    Refill:  0    Order Specific Question:   Supervising Provider    Answer:   Tresa Garter [id number]    Order Specific Question:   Number of strips    Answer:   100    Order Specific Question:   Number of lancets    Answer:   100   HYDROcodone bit-homatropine (HYCODAN) 5-1.5 MG/5ML syrup    Sig: Take 5 mLs by mouth every 8 (eight) hours as needed for up to 10 days for cough.    Dispense:  120 mL    Refill:  0    Order Specific Question:   Supervising Provider    Answer:   Tresa Garter [id number]    Follow-up: Return in about 3 months (around 11/17/2021) for follow up [id number].    Vevelyn Francois, NP

## 2021-08-26 ENCOUNTER — Other Ambulatory Visit: Payer: Self-pay | Admitting: Nurse Practitioner

## 2021-08-26 LAB — URINE CULTURE

## 2021-08-26 MED ORDER — PENICILLIN V POTASSIUM 500 MG PO TABS
500.0000 mg | ORAL_TABLET | Freq: Three times a day (TID) | ORAL | 0 refills | Status: AC
  Filled 2021-08-26: qty 21, 7d supply, fill #0

## 2021-08-26 NOTE — Progress Notes (Signed)
Milinda Pointer   [facility] Patient [facility] [address] [place], [place]  [address] Phone:  [phone]   Fax:  [phone]

## 2021-08-27 ENCOUNTER — Other Ambulatory Visit (HOSPITAL_COMMUNITY): Payer: Self-pay

## 2021-08-29 ENCOUNTER — Other Ambulatory Visit (HOSPITAL_COMMUNITY): Payer: Self-pay

## 2021-08-31 ENCOUNTER — Other Ambulatory Visit (HOSPITAL_COMMUNITY): Payer: Self-pay

## 2021-10-25 ENCOUNTER — Emergency Department (HOSPITAL_COMMUNITY): Payer: 59

## 2021-10-25 ENCOUNTER — Other Ambulatory Visit: Payer: Self-pay

## 2021-10-25 ENCOUNTER — Emergency Department (HOSPITAL_COMMUNITY)
Admission: EM | Admit: 2021-10-25 | Discharge: 2021-10-26 | Disposition: A | Discharge status: Home / Self Care | Payer: 59 | Attending: Emergency Medicine | Admitting: Emergency Medicine

## 2021-10-25 ENCOUNTER — Encounter (HOSPITAL_COMMUNITY): Payer: Self-pay | Admitting: Emergency Medicine

## 2021-10-25 DIAGNOSIS — Z7951 Long term (current) use of inhaled steroids: Secondary | ICD-10-CM | POA: Insufficient documentation

## 2021-10-25 DIAGNOSIS — R200 Anesthesia of skin: Secondary | ICD-10-CM

## 2021-10-25 DIAGNOSIS — Z87891 Personal history of nicotine dependence: Secondary | ICD-10-CM | POA: Insufficient documentation

## 2021-10-25 DIAGNOSIS — U071 COVID-19: Secondary | ICD-10-CM | POA: Diagnosis not present

## 2021-10-25 DIAGNOSIS — E119 Type 2 diabetes mellitus without complications: Secondary | ICD-10-CM | POA: Insufficient documentation

## 2021-10-25 DIAGNOSIS — Z79899 Other long term (current) drug therapy: Secondary | ICD-10-CM | POA: Insufficient documentation

## 2021-10-25 DIAGNOSIS — I639 Cerebral infarction, unspecified: Secondary | ICD-10-CM | POA: Diagnosis not present

## 2021-10-25 DIAGNOSIS — R531 Weakness: Secondary | ICD-10-CM

## 2021-10-25 DIAGNOSIS — I10 Essential (primary) hypertension: Secondary | ICD-10-CM

## 2021-10-25 DIAGNOSIS — R202 Paresthesia of skin: Secondary | ICD-10-CM | POA: Diagnosis not present

## 2021-10-25 LAB — URINALYSIS, ROUTINE W REFLEX MICROSCOPIC
Bilirubin Urine: NEGATIVE
Glucose, UA: NEGATIVE mg/dL
Ketones, ur: NEGATIVE mg/dL
Nitrite: NEGATIVE
Protein, ur: 30 mg/dL — AB
RBC / HPF: >50 RBC/hpf — ABNORMAL HIGH (ref 0–5)
Specific Gravity, Urine: 1.026 (ref 1.005–1.030)
pH: 6 (ref 5.0–8.0)

## 2021-10-25 LAB — COMPREHENSIVE METABOLIC PANEL
ALT: 20 U/L (ref 0–44)
AST: 20 U/L (ref 15–41)
Albumin: 3.9 g/dL (ref 3.5–5.0)
Alkaline Phosphatase: 62 U/L (ref 38–126)
Anion gap: 9 (ref 5–15)
BUN: 9 mg/dL (ref 6–20)
CO2: 24 mmol/L (ref 22–32)
Calcium: 8.4 mg/dL — ABNORMAL LOW (ref 8.9–10.3)
Chloride: 109 mmol/L (ref 98–111)
Creatinine, Ser: 0.84 mg/dL (ref 0.44–1.00)
GFR, Estimated: >60 mL/min (ref >60)
Glucose, Bld: 103 mg/dL — ABNORMAL HIGH (ref 70–99)
Potassium: 3.8 mmol/L (ref 3.5–5.1)
Sodium: 142 mmol/L (ref 135–145)
Total Bilirubin: 0.8 mg/dL (ref 0.3–1.2)
Total Protein: 6.9 g/dL (ref 6.5–8.1)

## 2021-10-25 LAB — CBC
HCT: 41.9 % (ref 36.0–46.0)
Hemoglobin: 13.1 g/dL (ref 12.0–15.0)
MCH: 25.4 pg — ABNORMAL LOW (ref 26.0–34.0)
MCHC: 31.3 g/dL (ref 30.0–36.0)
MCV: 81.4 fL (ref 80.0–100.0)
Platelets: 336 10*3/uL (ref 150–400)
RBC: 5.15 MIL/uL — ABNORMAL HIGH (ref 3.87–5.11)
RDW: 15.2 % (ref 11.5–15.5)
WBC: 15.8 10*3/uL — ABNORMAL HIGH (ref 4.0–10.5)
nRBC: 0 % (ref 0.0–0.2)

## 2021-10-25 LAB — RESP PANEL BY RT-PCR (FLU A&B, COVID) ARPGX2
Influenza A by PCR: NEGATIVE
Influenza B by PCR: NEGATIVE
SARS Coronavirus 2 by RT PCR: POSITIVE — AB

## 2021-10-25 LAB — DIFFERENTIAL
Abs Immature Granulocytes: 0.05 10*3/uL (ref 0.00–0.07)
Basophils Absolute: 0.1 10*3/uL (ref 0.0–0.1)
Basophils Relative: 0 %
Eosinophils Absolute: 0.3 10*3/uL (ref 0.0–0.5)
Eosinophils Relative: 2 %
Immature Granulocytes: 0 %
Lymphocytes Relative: 35 %
Lymphs Abs: 5.6 10*3/uL — ABNORMAL HIGH (ref 0.7–4.0)
Monocytes Absolute: 1 10*3/uL (ref 0.1–1.0)
Monocytes Relative: 6 %
Neutro Abs: 8.9 10*3/uL — ABNORMAL HIGH (ref 1.7–7.7)
Neutrophils Relative %: 57 %

## 2021-10-25 LAB — I-STAT CHEM 8, ED
BUN: 10 mg/dL (ref 6–20)
Calcium, Ion: 1.13 mmol/L — ABNORMAL LOW (ref 1.15–1.40)
Chloride: 106 mmol/L (ref 98–111)
Creatinine, Ser: 0.8 mg/dL (ref 0.44–1.00)
Glucose, Bld: 98 mg/dL (ref 70–99)
HCT: 44 % (ref 36.0–46.0)
Hemoglobin: 15 g/dL (ref 12.0–15.0)
Potassium: 3.9 mmol/L (ref 3.5–5.1)
Sodium: 141 mmol/L (ref 135–145)
TCO2: 26 mmol/L (ref 22–32)

## 2021-10-25 LAB — ETHANOL: Alcohol, Ethyl (B): <10 mg/dL (ref <10)

## 2021-10-25 LAB — RAPID URINE DRUG SCREEN, HOSP PERFORMED
Amphetamines: NOT DETECTED
Barbiturates: NOT DETECTED
Benzodiazepines: NOT DETECTED
Cocaine: NOT DETECTED
Opiates: NOT DETECTED
Tetrahydrocannabinol: POSITIVE — AB

## 2021-10-25 LAB — I-STAT BETA HCG BLOOD, ED (MC, WL, AP ONLY): I-stat hCG, quantitative: <5 m[IU]/mL (ref <5)

## 2021-10-25 LAB — PROTIME-INR
INR: 0.9 (ref 0.8–1.2)
Prothrombin Time: 12.5 seconds (ref 11.4–15.2)

## 2021-10-25 LAB — APTT: aPTT: 28 seconds (ref 24–36)

## 2021-10-25 LAB — CBG MONITORING, ED: Glucose-Capillary: 106 mg/dL — ABNORMAL HIGH (ref 70–99)

## 2021-10-25 IMAGING — MR MR HEAD W/O CM
14 of 15 series · 44 of 48 positions shown · non-contrast
Comparison: Head CT [DATE]

CLINICAL DATA: Acute neurologic deficit

EXAM:
MRI HEAD WITHOUT CONTRAST
MRA HEAD WITHOUT CONTRAST
MRA NECK WITHOUT CONTRAST
TECHNIQUE: Multiplanar, multi-echo pulse sequences of the brain and surrounding
structures were acquired without intravenous contrast. Angiographic
images of the Circle of Willis were acquired using MRA technique
without intravenous contrast. Angiographic images of the neck were
acquired using MRA technique without intravenous contrast. Carotid
stenosis measurements (when applicable) are obtained utilizing
NASCET criteria, using the distal internal carotid diameter as the
denominator.

[Series 5: DWI · axial · 3.0mm · 0.92mm/px · z∈[-84,+81]mm · 6 of 112 slices shown (1 of 4)]
[im 1/112]
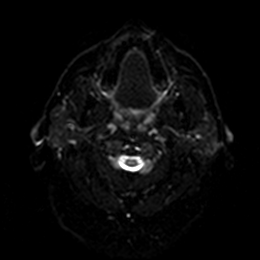
[im 23/112]
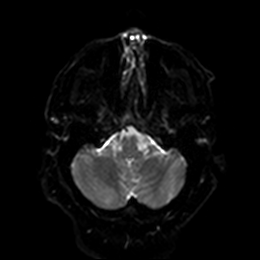
[im 45/112]
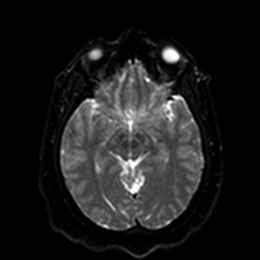
[im 67/112]
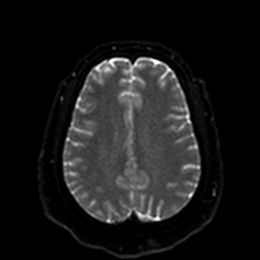
[im 89/112]
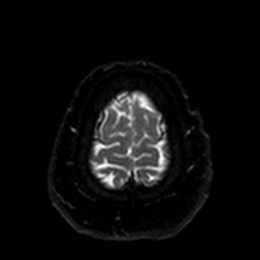
[im 112/112]
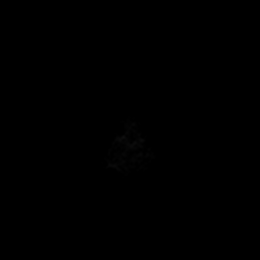

[Series 6: DWI · axial · 3.0mm · 0.92mm/px · z∈[-84,+81]mm · 4 of 56 slices shown (2 of 4)]
[im 1/56]
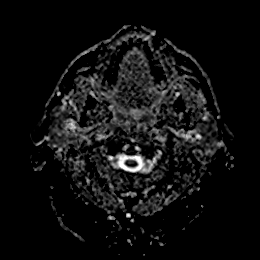
[im 19/56]
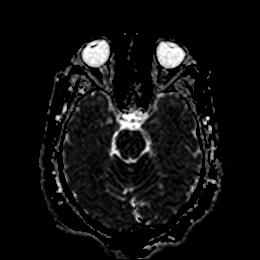
[im 37/56]
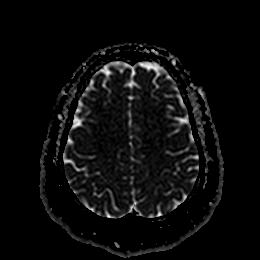
[im 56/56]
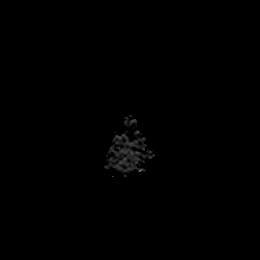

[Series 7: DWI · coronal · 4.0mm · 0.88mm/px · 5 of 86 slices shown (3 of 4)]
[im 1/86]
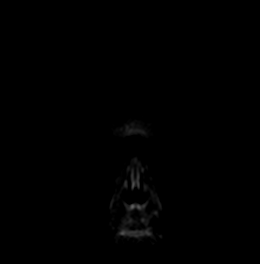
[im 22/86]
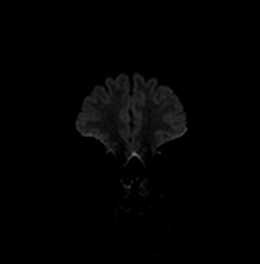
[im 43/86]
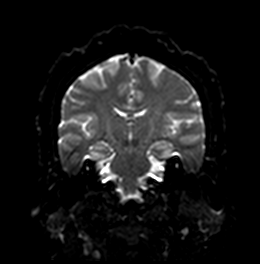
[im 64/86]
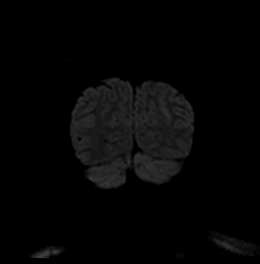
[im 86/86]
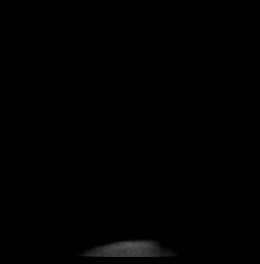

[Series 8: DWI · coronal · 4.0mm · 0.88mm/px · 3 of 43 slices shown (4 of 4)]
[im 1/43]
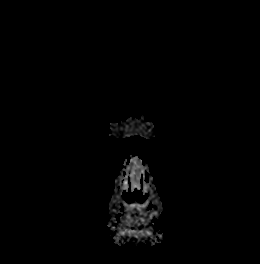
[im 22/43]
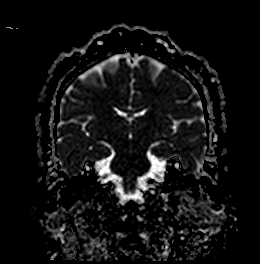
[im 43/43]
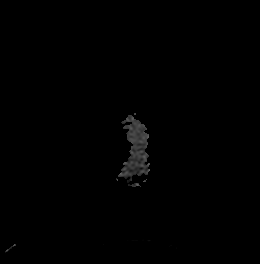

[Series 9: T1 · sagittal · 5.0mm · 0.75mm/px · 2 of 29 slices shown (1 of 2)]
[im 1/29]
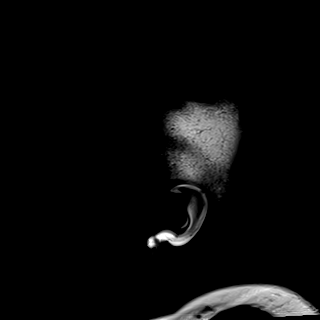
[im 29/29]
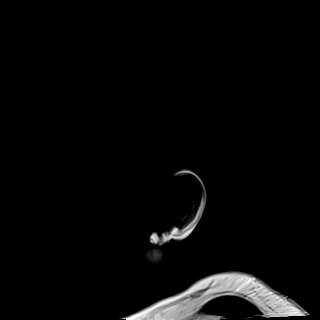

[Series 10: T2 · axial · 5.0mm · 0.75mm/px · z∈[-88,+80]mm · 2 of 29 slices shown (1 of 3)]
[im 1/29]
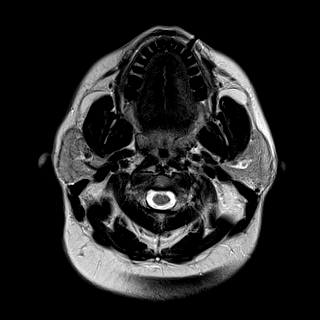
[im 29/29]
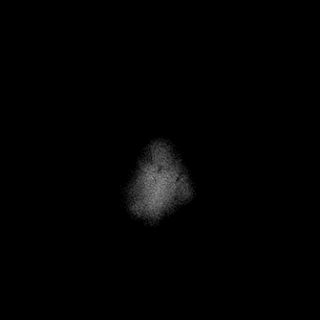

[Series 11: T1 · sagittal · 5.0mm · 0.75mm/px · 2 of 29 slices shown (2 of 2)]
[im 1/29]
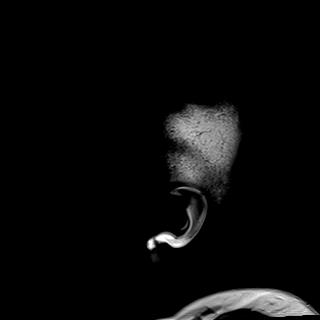
[im 29/29]
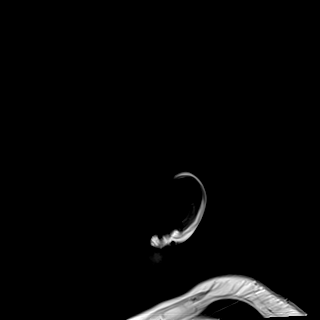

[Series 12: FLAIR · axial · 5.0mm · 0.47mm/px · z∈[-87,+81]mm · 2 of 29 slices shown]
[im 1/29]
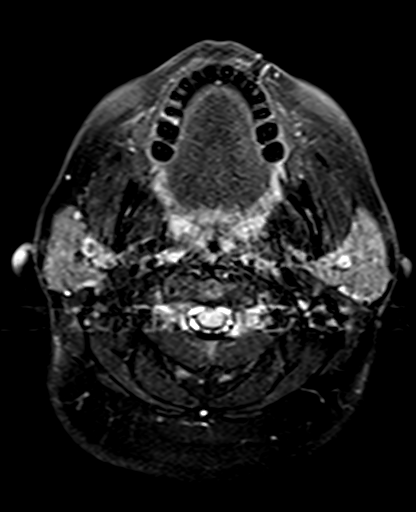
[im 29/29]
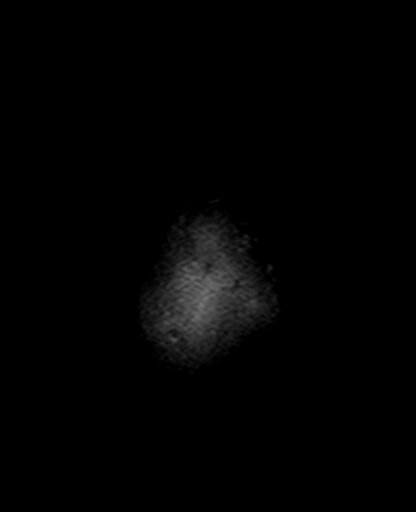

[Series 18: T2 · coronal · 5.0mm · 0.34mm/px · 2 of 36 slices shown (2 of 3)]
[im 1/36]
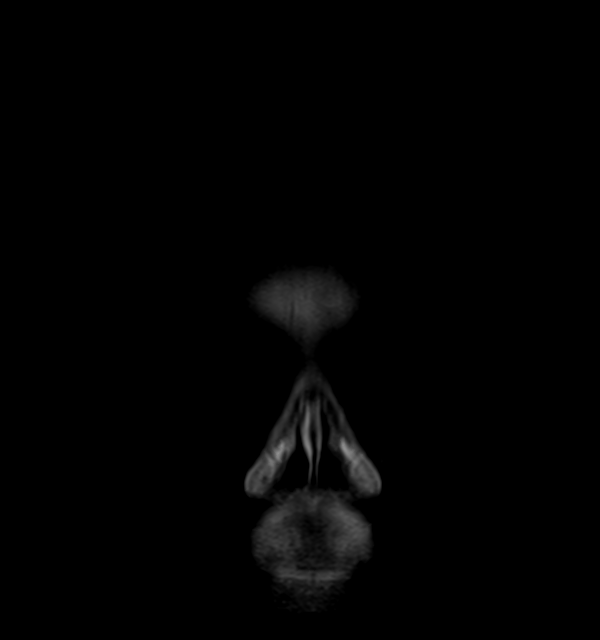
[im 36/36]
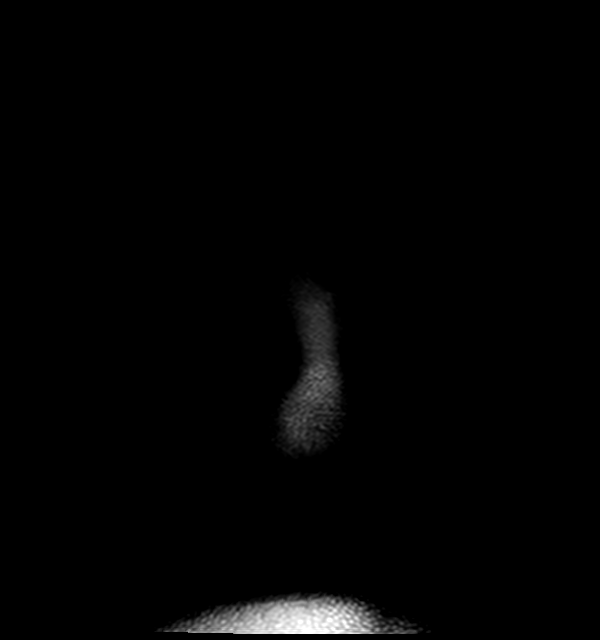

[Series 20: mag_images · axial · 3.0mm · 0.94mm/px · z∈[-81,+83]mm · 4 of 56 slices shown]
[im 1/56]
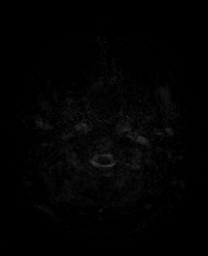
[im 19/56]
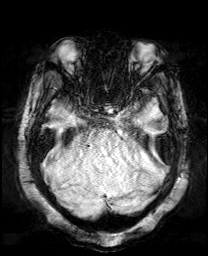
[im 37/56]
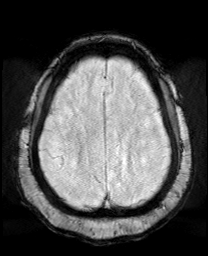
[im 56/56]
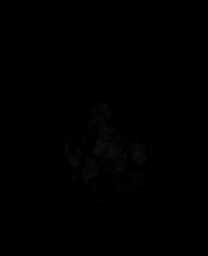

[Series 21: pha_images · axial · 3.0mm · 0.94mm/px · z∈[-78,+83]mm · 3 of 55 slices shown]
[im 1/55]
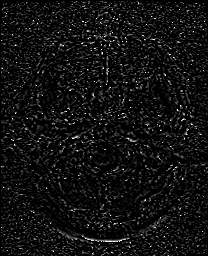
[im 28/55]
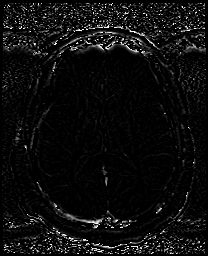
[im 55/55]
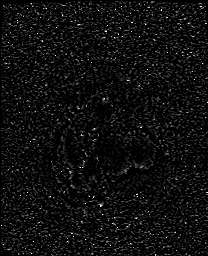

[Series 22: swi_images · axial · 3.0mm · 0.94mm/px · z∈[-81,+83]mm · 4 of 56 slices shown]
[im 1/56]
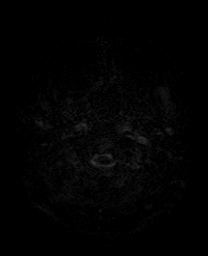
[im 19/56]
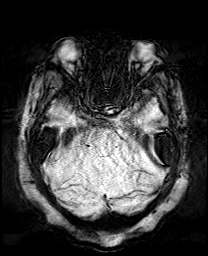
[im 37/56]
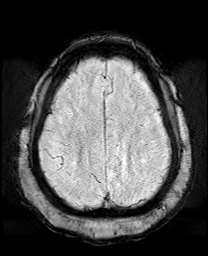
[im 56/56]
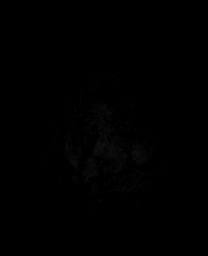

[Series 23: mip_images(sw) · axial · 24.0mm · 0.94mm/px · z∈[-71,+73]mm · 3 of 49 slices shown]
[im 1/49]
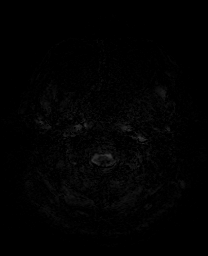
[im 25/49]
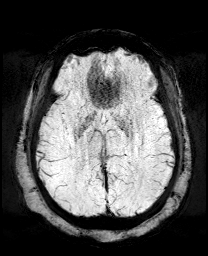
[im 49/49]
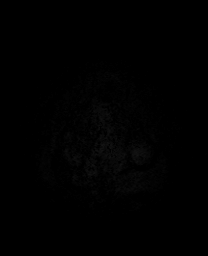

[Series 24: T2 · coronal · 5.0mm · 0.34mm/px · 2 of 36 slices shown (3 of 3)]
[im 1/36]
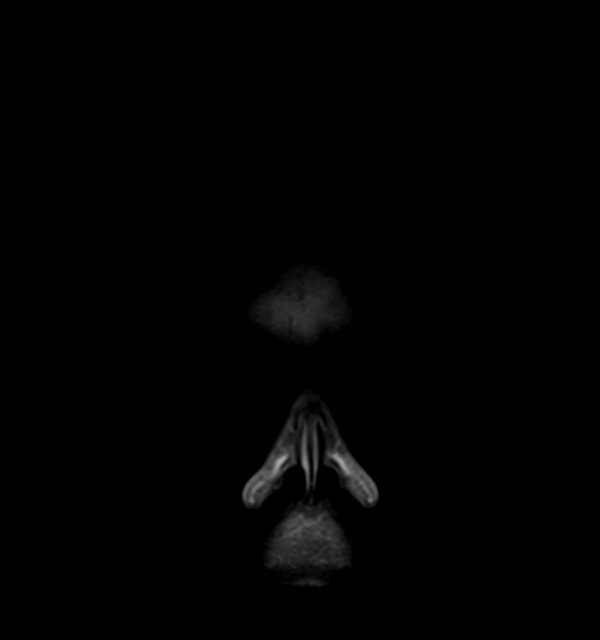
[im 36/36]
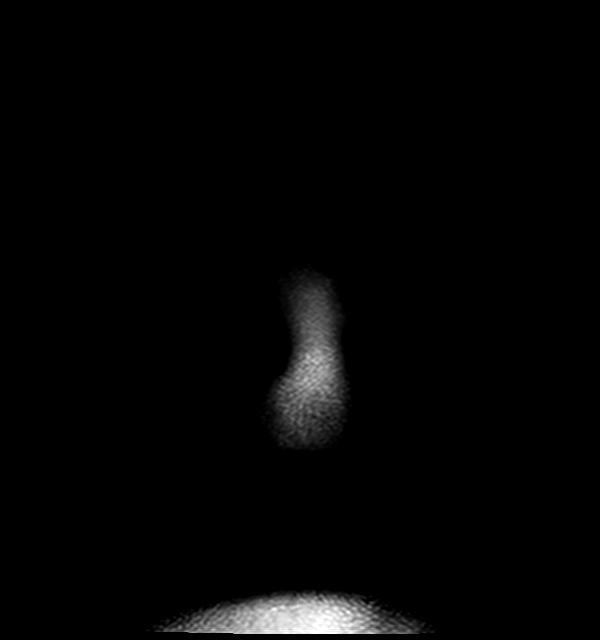

[44 of 48 positions shown; findings below may reference images not displayed]

FINDINGS: MRI HEAD FINDINGS

Brain: No acute infarct, mass effect or extra-axial collection. No
acute or chronic hemorrhage. Normal white matter signal, parenchymal
volume and CSF spaces. The midline structures are normal.

Vascular: Major flow voids are preserved.

Skull and upper cervical spine: Normal calvarium and skull base.
Visualized upper cervical spine and soft tissues are normal.

Sinuses/Orbits:No paranasal sinus fluid levels or advanced mucosal
thickening. No mastoid or middle ear effusion. Normal orbits.

MRA HEAD FINDINGS

POSTERIOR CIRCULATION:

--Vertebral arteries: Normal

--Inferior cerebellar arteries: Normal.

--Basilar artery: Normal.

--Superior cerebellar arteries: Normal.

--Posterior cerebral arteries: Normal.

ANTERIOR CIRCULATION:

--Intracranial internal carotid arteries: Normal.

--Anterior cerebral arteries (ACA): Normal.

--Middle cerebral arteries (MCA): Normal.

ANATOMIC VARIANTS: None

MRA NECK FINDINGS

The neck portion of the study is markedly motion degraded. The
vertebral arteries are codominant and patent. No carotid artery
stenosis is visible.
IMPRESSION: 1. Normal MRI of the brain.
2. Normal MRA of the head
3. Motion degraded MRA of the neck without visible high grade
stenosis.

## 2021-10-25 IMAGING — CT CT HEAD CODE STROKE
4 series · 16 of 47 positions shown, 18 images · non-contrast
Comparison: None.

CLINICAL DATA: Code stroke.  Right-sided numbness

EXAM:
CT HEAD WITHOUT CONTRAST
TECHNIQUE: Contiguous axial images were obtained from the base of the skull
through the vertex without intravenous contrast.

[Series 3: head wo · axial · 0.48mm/px · z∈[+1277,+1397]mm · 7 of 33 slices shown, 9 images]
[im 5/33  brain]
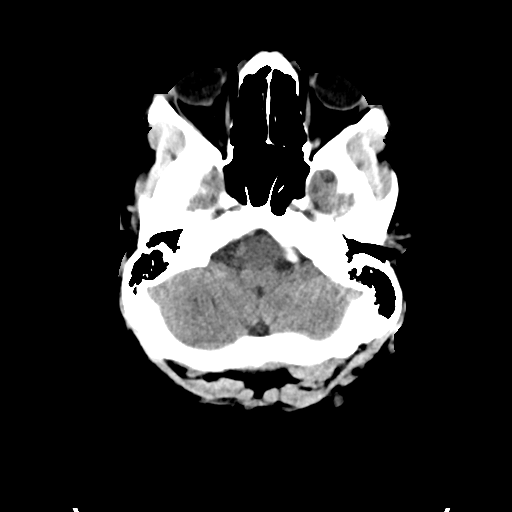
[im 5/33  bone]
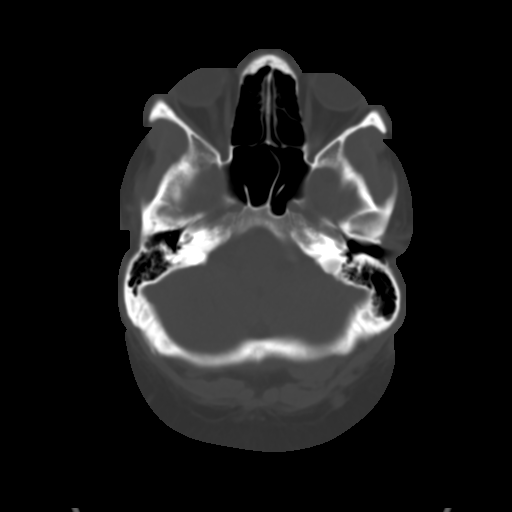
[im 9/33  brain]
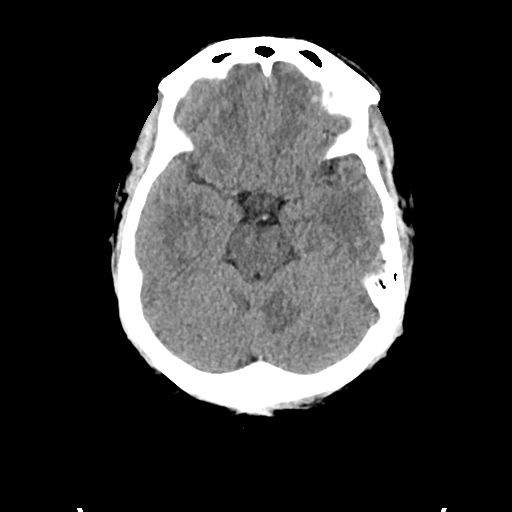
[im 13/33  brain]
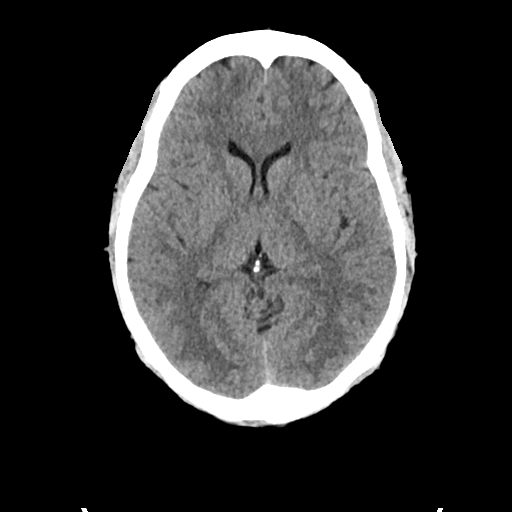
[im 17/33  brain]
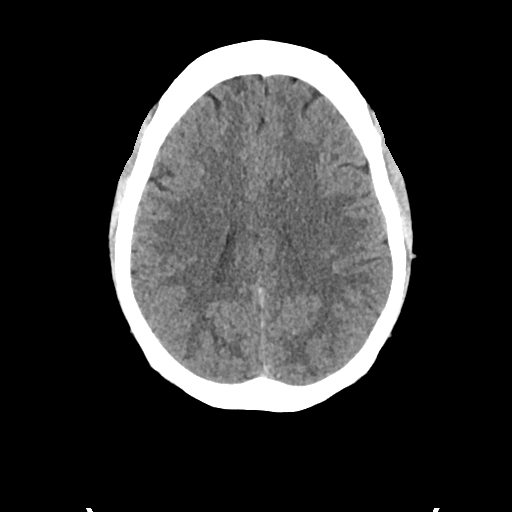
[im 21/33  brain]
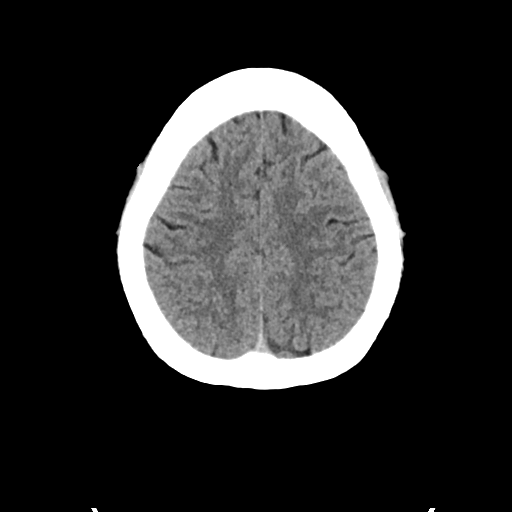
[im 21/33  bone]
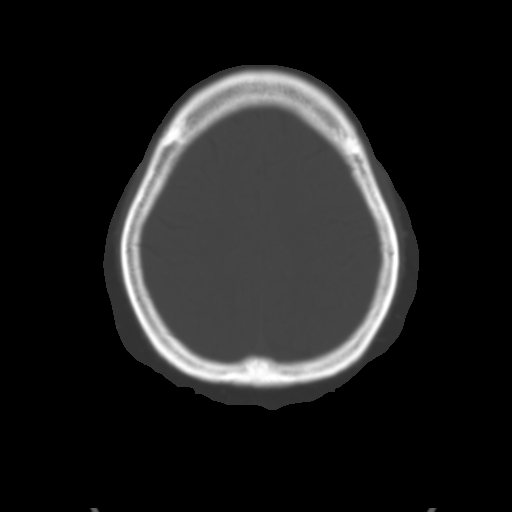
[im 25/33  brain]
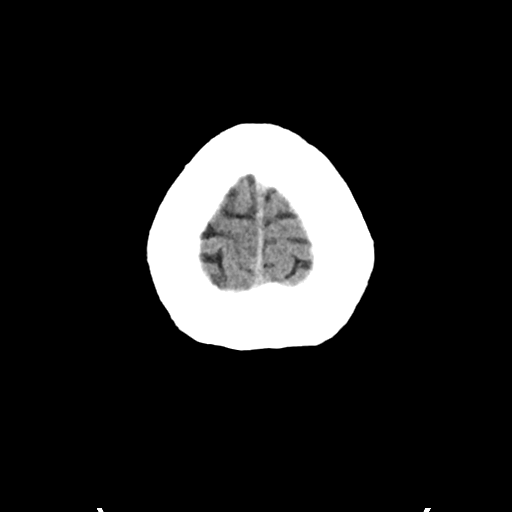
[im 29/33  brain]
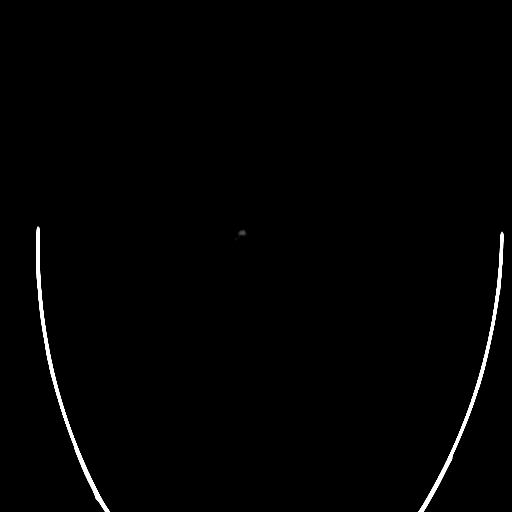

[Series 4: head bone · axial · 0.48mm/px · z∈[+1273,+1305]mm · 3 of 81 slices shown]
[im 9/81  bone]
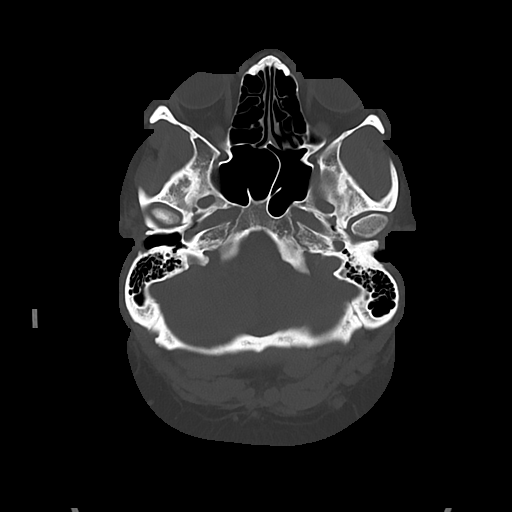
[im 17/81  bone]
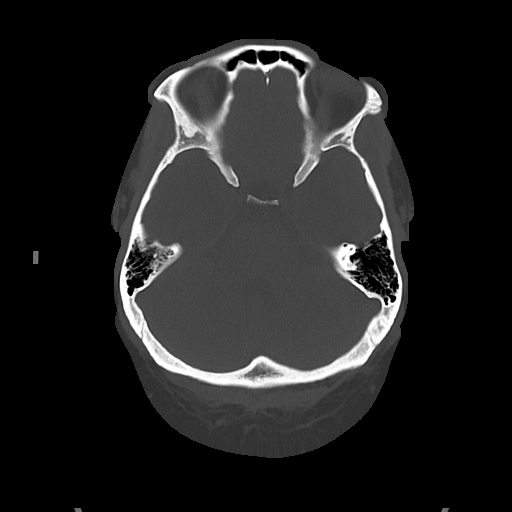
[im 25/81  bone]
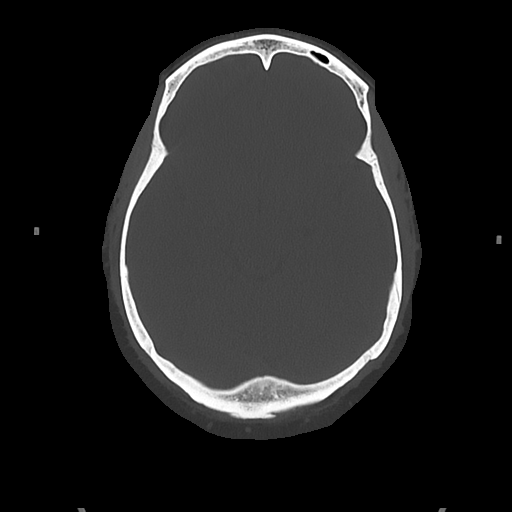

[Series 5: cor soft · coronal · 0.31mm/px · 3 of 73 slices shown]
[im 25/73  brain]
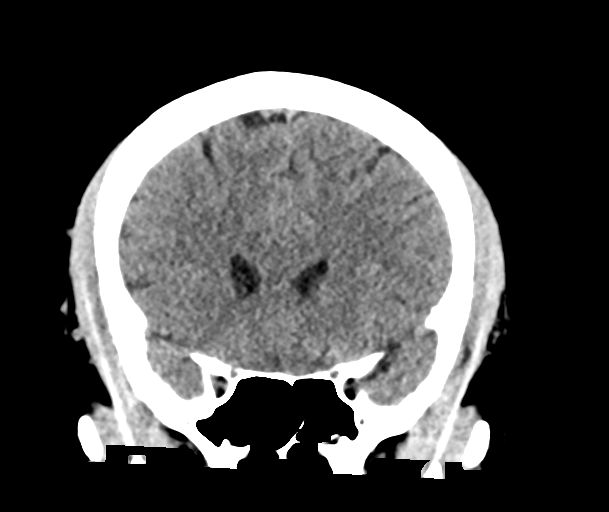
[im 33/73  brain]
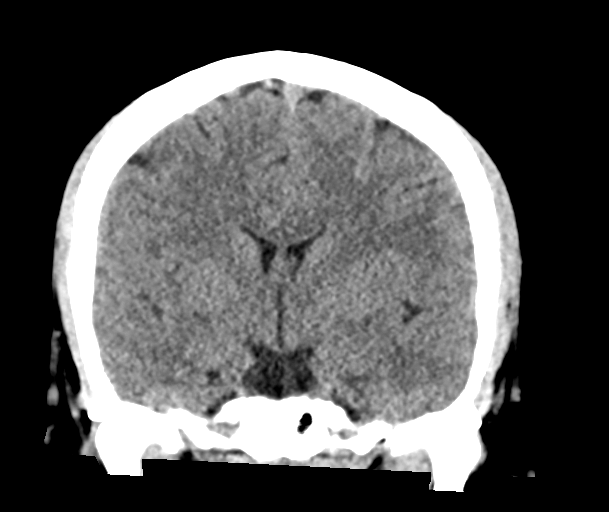
[im 41/73  brain]
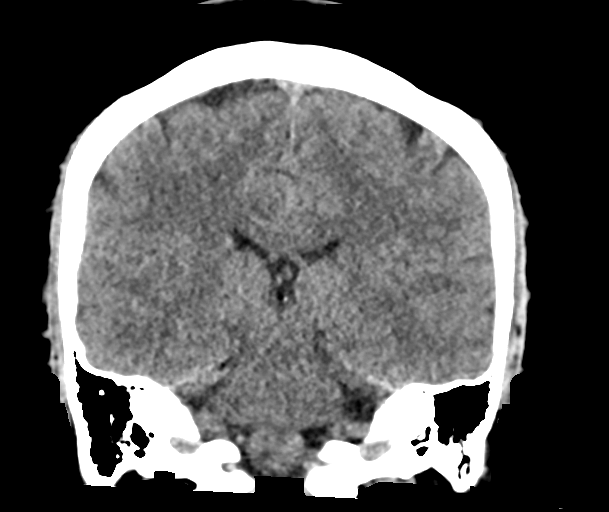

[Series 6: sag soft · sagittal · 0.31mm/px · 3 of 62 slices shown]
[im 21/62  brain]
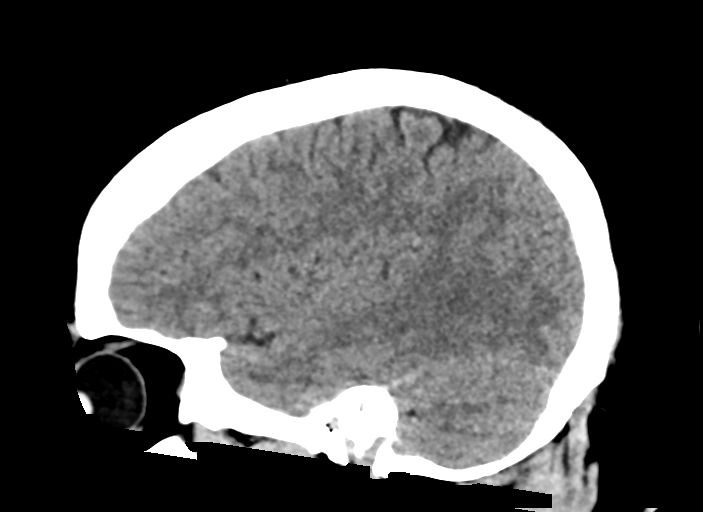
[im 31/62  brain]
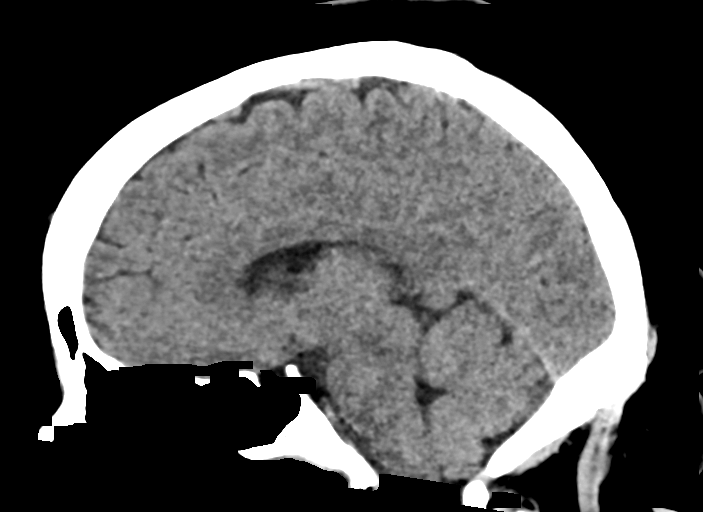
[im 41/62  brain]
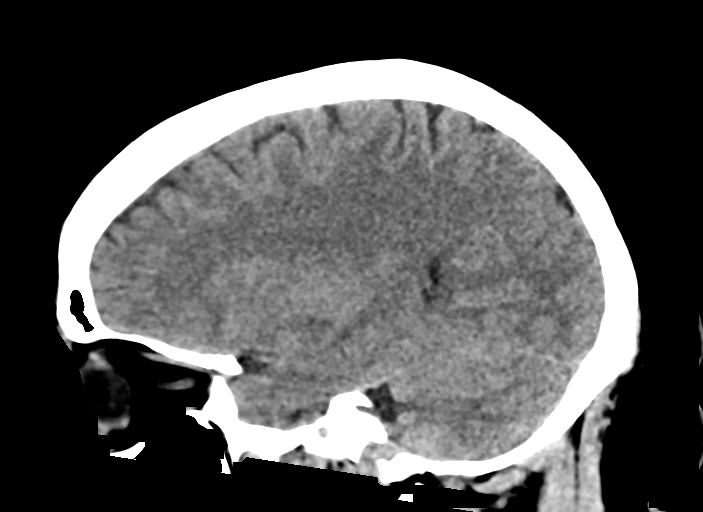

[16 of 47 positions shown; findings below may reference images not displayed]

FINDINGS: Brain: There is no mass, hemorrhage or extra-axial collection. The
size and configuration of the ventricles and extra-axial CSF spaces
are normal. The brain parenchyma is normal, without evidence of
acute or chronic infarction.

Vascular: No abnormal hyperdensity of the major intracranial
arteries or dural venous sinuses. No intracranial atherosclerosis.

Skull: The visualized skull base, calvarium and extracranial soft
tissues are normal.

Sinuses/Orbits: No fluid levels or advanced mucosal thickening of
the visualized paranasal sinuses. No mastoid or middle ear effusion.
The orbits are normal.

ASPECTS ([place] Stroke Program Early CT Score)

- Ganglionic level infarction (caudate, lentiform nuclei, internal
capsule, insula, M1-M3 cortex): 7

- Supraganglionic infarction (M4-M6 cortex): 3

Total score (0-10 with 10 being normal): 10
IMPRESSION: 1. Normal head CT.
2. ASPECTS is 10.

Dr. NEUWIRTH paged at 10 o'clock p.m.

## 2021-10-25 IMAGING — MR MR MRA NECK W/O CM
5 of 6 series · 36 of 48 positions shown · non-contrast
Comparison: Head CT [DATE]

CLINICAL DATA: Acute neurologic deficit

EXAM:
MRI HEAD WITHOUT CONTRAST
MRA HEAD WITHOUT CONTRAST
MRA NECK WITHOUT CONTRAST
TECHNIQUE: Multiplanar, multi-echo pulse sequences of the brain and surrounding
structures were acquired without intravenous contrast. Angiographic
images of the Circle of Willis were acquired using MRA technique
without intravenous contrast. Angiographic images of the neck were
acquired using MRA technique without intravenous contrast. Carotid
stenosis measurements (when applicable) are obtained utilizing
NASCET criteria, using the distal internal carotid diameter as the
denominator.

[Series 15: tof_fl2d_tra · axial · 3.5mm · 0.86mm/px · z∈[-249,-24]mm · 11 of 97 slices shown]
[im 1/97]
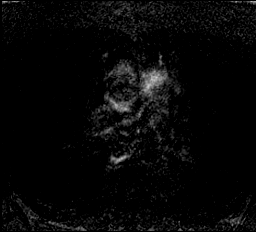
[im 10/97]
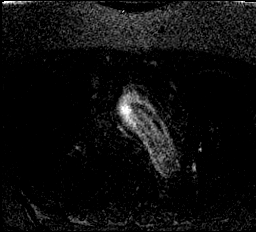
[im 20/97]
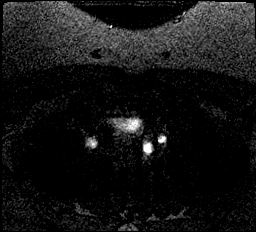
[im 29/97]
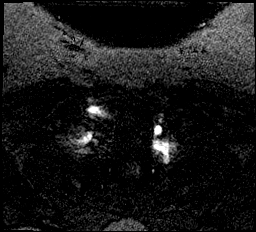
[im 39/97]
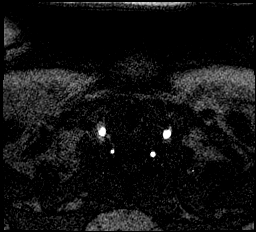
[im 49/97]
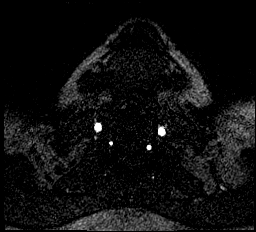
[im 58/97]
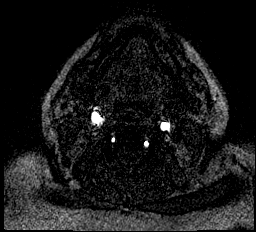
[im 68/97]
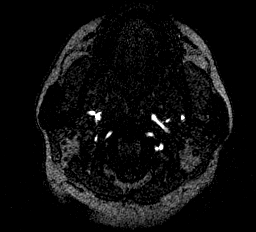
[im 77/97]
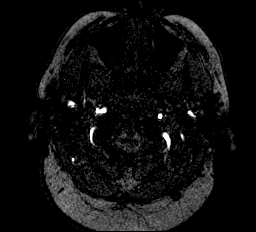
[im 87/97]
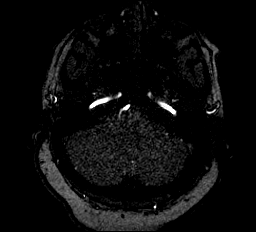
[im 97/97]
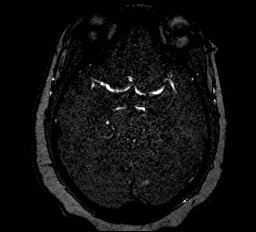

[Series 16: tof_fl3d_tra_iso · axial · 0.6mm · 0.62mm/px · z∈[-161,-80]mm · 12 of 146 slices shown (1 of 2)]
[im 1/146]
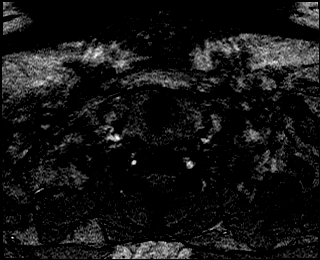
[im 10/146]
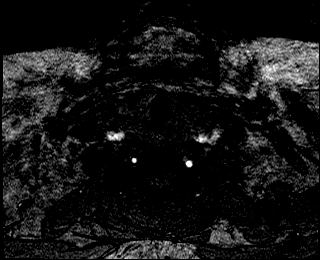
[im 19/146]
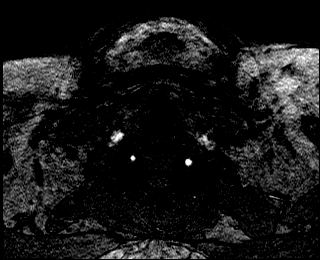
[im 28/146]
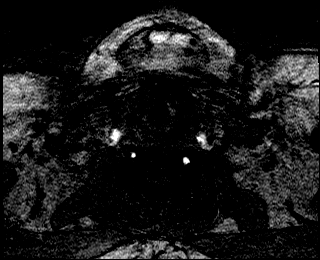
[im 46/146]
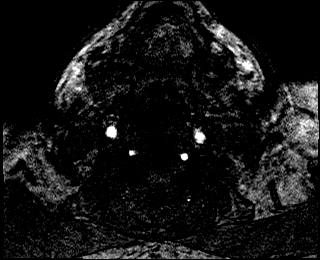
[im 64/146]
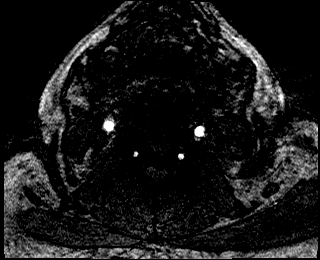
[im 73/146]
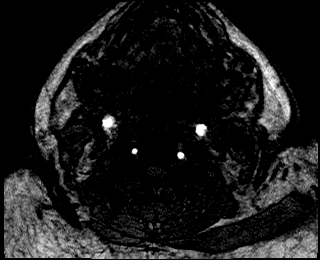
[im 82/146]
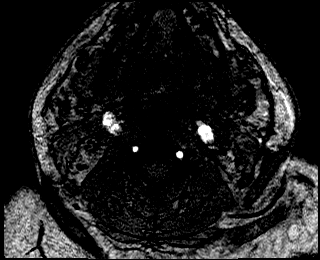
[im 100/146]
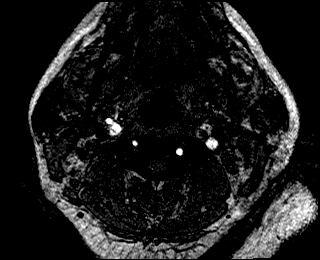
[im 118/146]
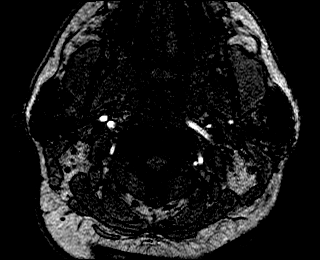
[im 127/146]
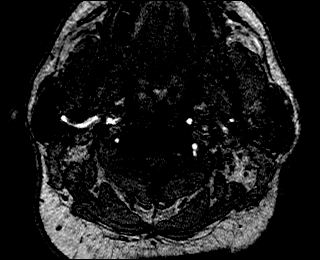
[im 136/146]
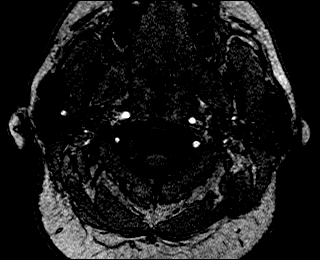

[Series 19: tof_fl3d_tra_iso · axial · 0.6mm · 0.62mm/px · z∈[-154,-79]mm · 11 of 146 slices shown (2 of 2)]
[im 10/146]
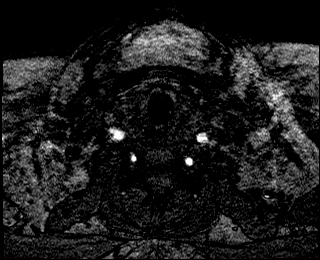
[im 19/146]
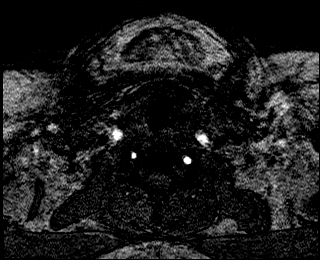
[im 28/146]
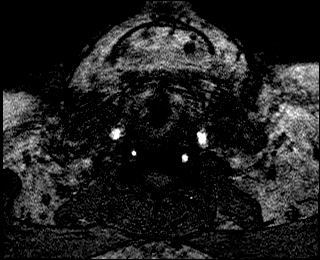
[im 46/146]
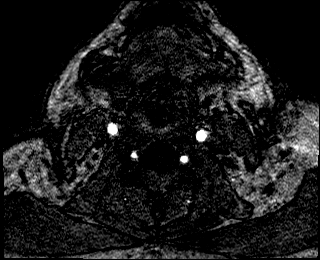
[im 64/146]
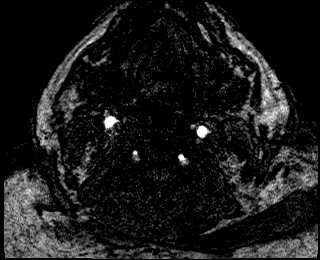
[im 73/146]
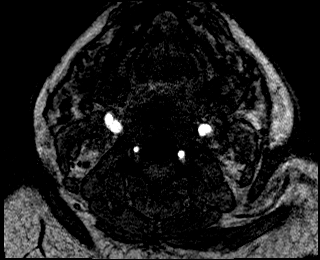
[im 82/146]
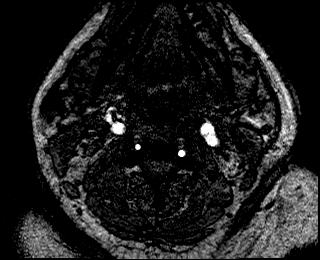
[im 100/146]
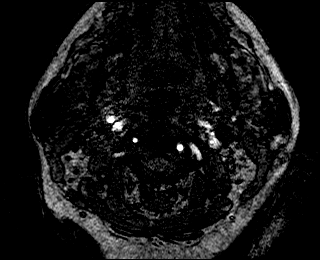
[im 118/146]
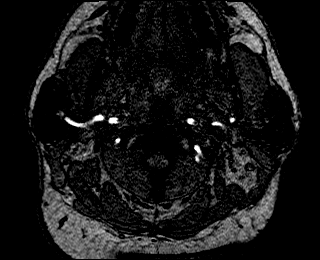
[im 127/146]
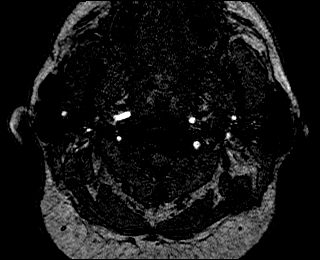
[im 136/146]
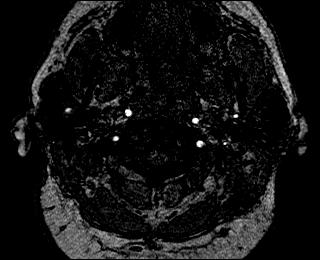

[Series 1003: carotid tumble · axial · 0.9mm · 0.23mm/px · 1 of 6 slices shown]
[im 1/6]
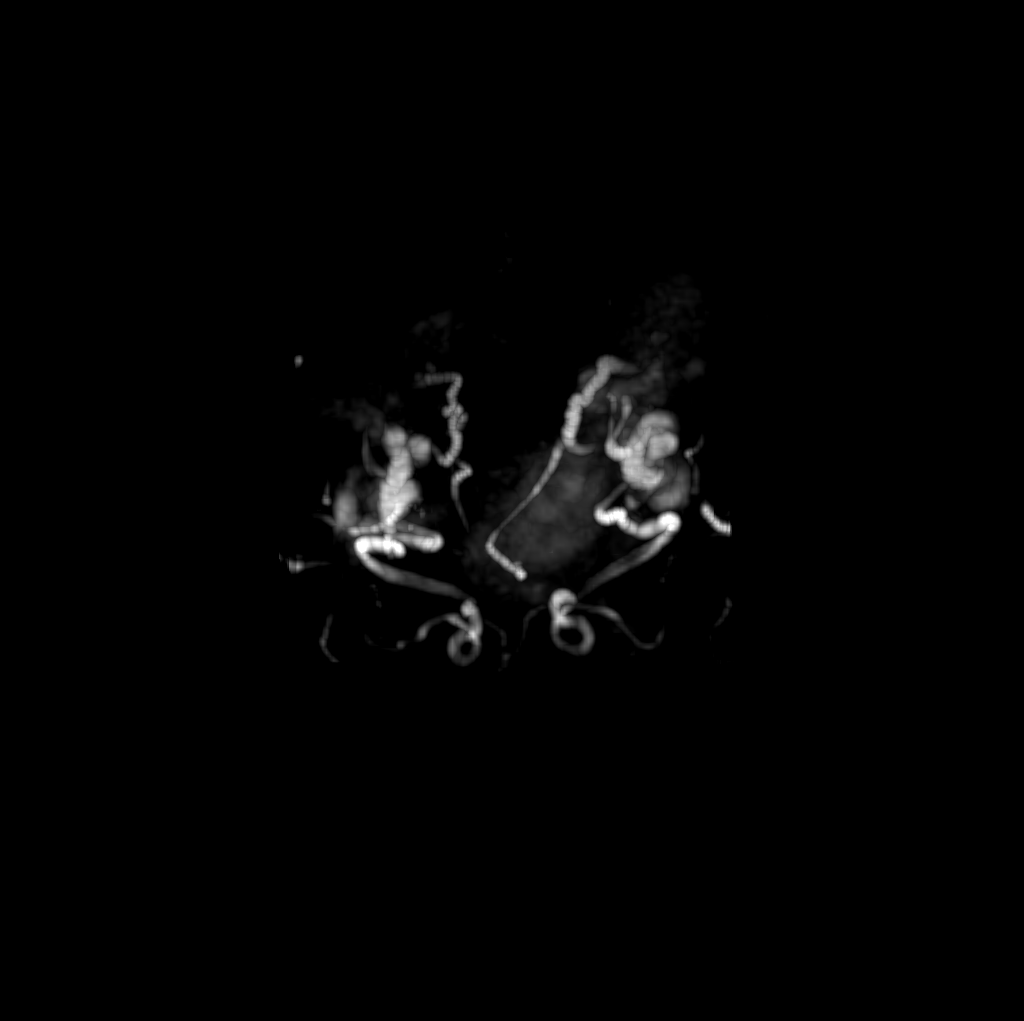

[Series 1025: vert tumble · axial · 0.9mm · 0.23mm/px · 1 of 3 slices shown]
[im 1/3]
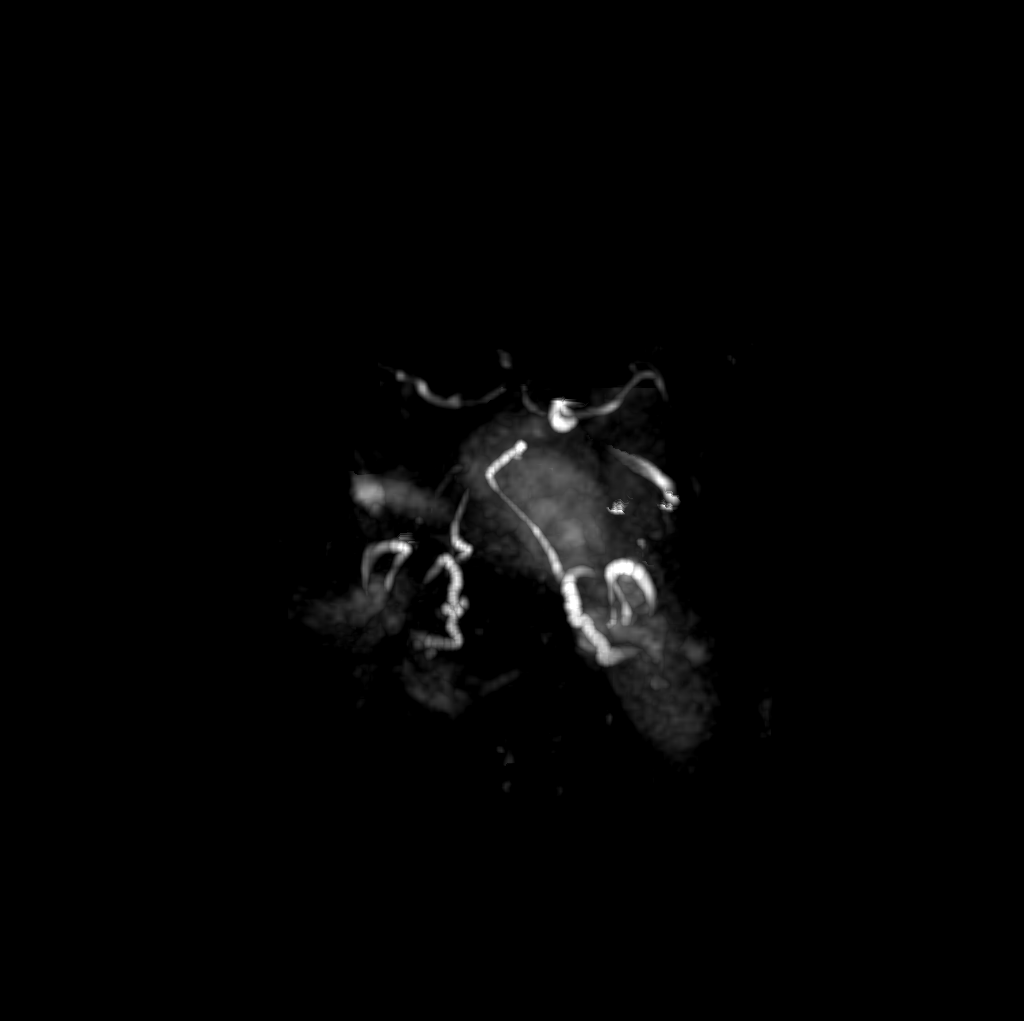

[36 of 48 positions shown; findings below may reference images not displayed]

FINDINGS: MRI HEAD FINDINGS

Brain: No acute infarct, mass effect or extra-axial collection. No
acute or chronic hemorrhage. Normal white matter signal, parenchymal
volume and CSF spaces. The midline structures are normal.

Vascular: Major flow voids are preserved.

Skull and upper cervical spine: Normal calvarium and skull base.
Visualized upper cervical spine and soft tissues are normal.

Sinuses/Orbits:No paranasal sinus fluid levels or advanced mucosal
thickening. No mastoid or middle ear effusion. Normal orbits.

MRA HEAD FINDINGS

POSTERIOR CIRCULATION:

--Vertebral arteries: Normal

--Inferior cerebellar arteries: Normal.

--Basilar artery: Normal.

--Superior cerebellar arteries: Normal.

--Posterior cerebral arteries: Normal.

ANTERIOR CIRCULATION:

--Intracranial internal carotid arteries: Normal.

--Anterior cerebral arteries (ACA): Normal.

--Middle cerebral arteries (MCA): Normal.

ANATOMIC VARIANTS: None

MRA NECK FINDINGS

The neck portion of the study is markedly motion degraded. The
vertebral arteries are codominant and patent. No carotid artery
stenosis is visible.
IMPRESSION: 1. Normal MRI of the brain.
2. Normal MRA of the head
3. Motion degraded MRA of the neck without visible high grade
stenosis.

## 2021-10-25 IMAGING — MR MR MRA HEAD W/O CM
1 series · 19 of 48 positions shown · non-contrast
Comparison: Head CT [DATE]

CLINICAL DATA: Acute neurologic deficit

EXAM:
MRI HEAD WITHOUT CONTRAST
MRA HEAD WITHOUT CONTRAST
MRA NECK WITHOUT CONTRAST
TECHNIQUE: Multiplanar, multi-echo pulse sequences of the brain and surrounding
structures were acquired without intravenous contrast. Angiographic
images of the Circle of Willis were acquired using MRA technique
without intravenous contrast. Angiographic images of the neck were
acquired using MRA technique without intravenous contrast. Carotid
stenosis measurements (when applicable) are obtained utilizing
NASCET criteria, using the distal internal carotid diameter as the
denominator.

[Series 5: 3d cow · axial · 0.5mm · 0.41mm/px · z∈[-75,+14]mm · 19 of 188 slices shown]
[im 1/188]
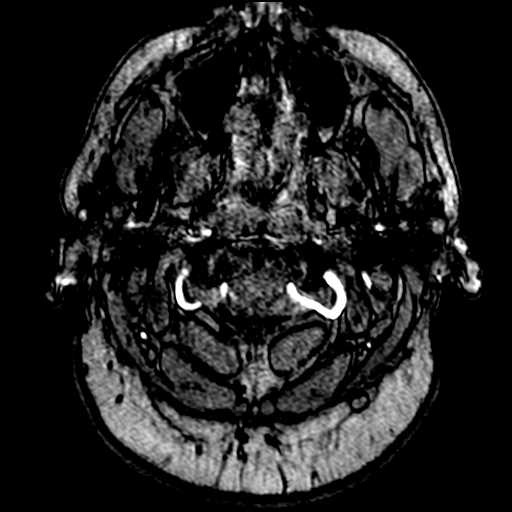
[im 4/188]
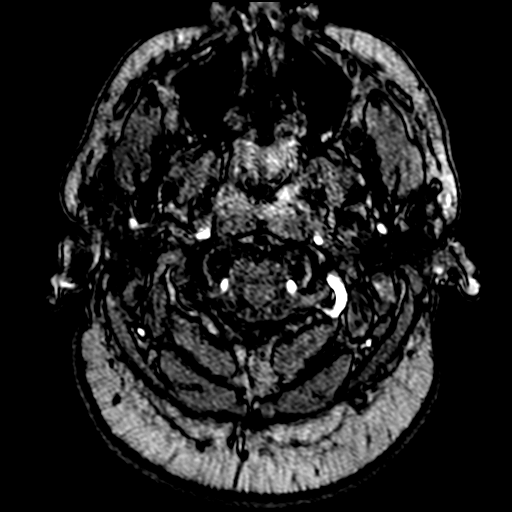
[im 8/188]
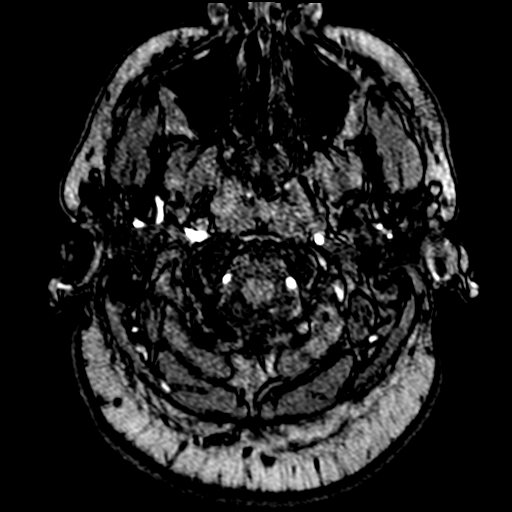
[im 12/188]
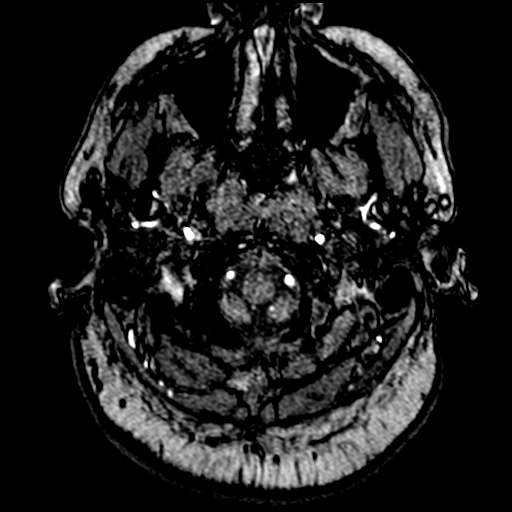
[im 16/188]
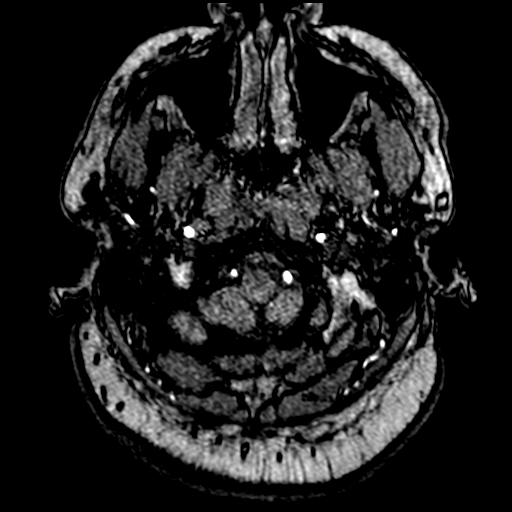
[im 20/188]
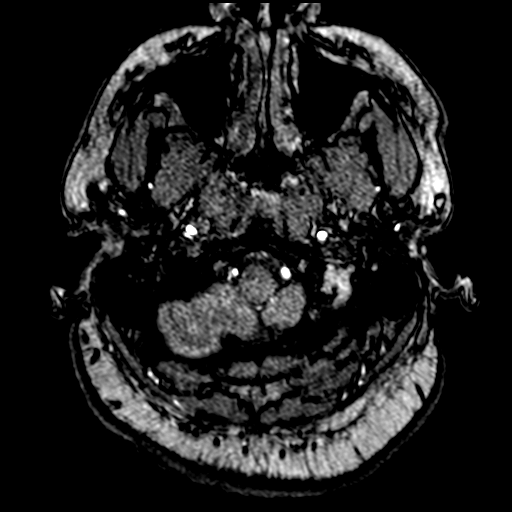
[im 24/188]
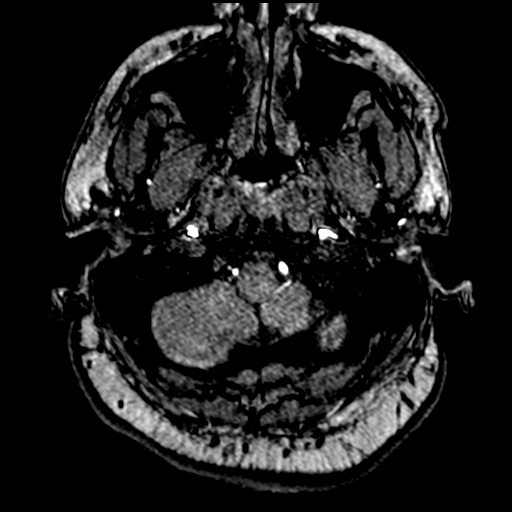
[im 28/188]
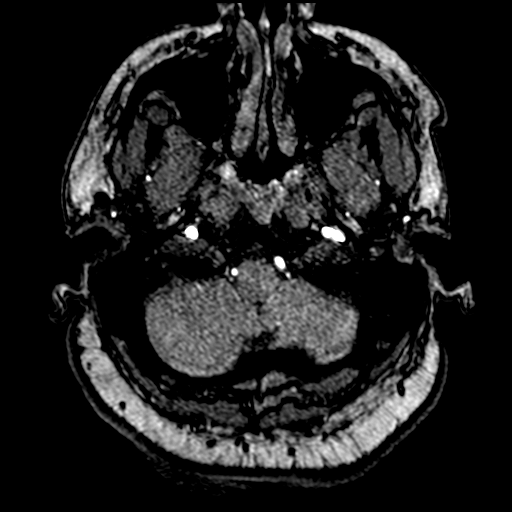
[im 32/188]
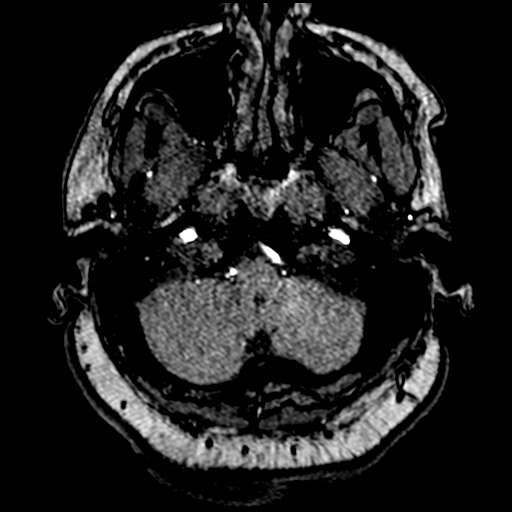
[im 36/188]
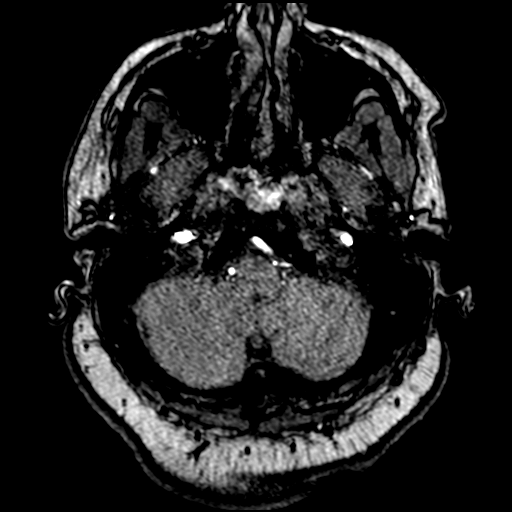
[im 40/188]
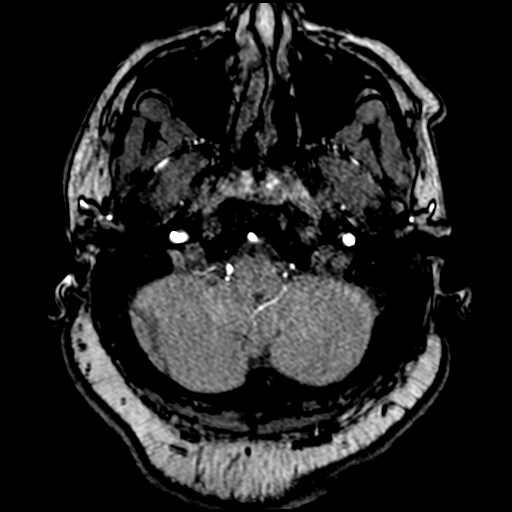
[im 60/188]
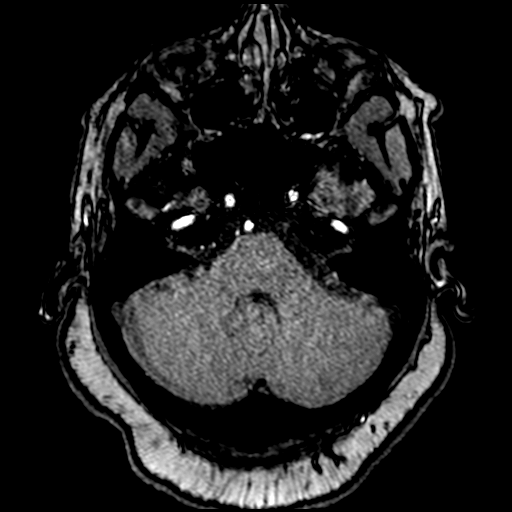
[im 84/188]
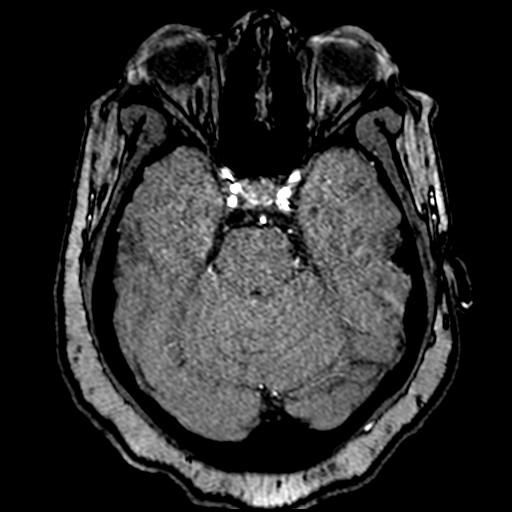
[im 96/188]
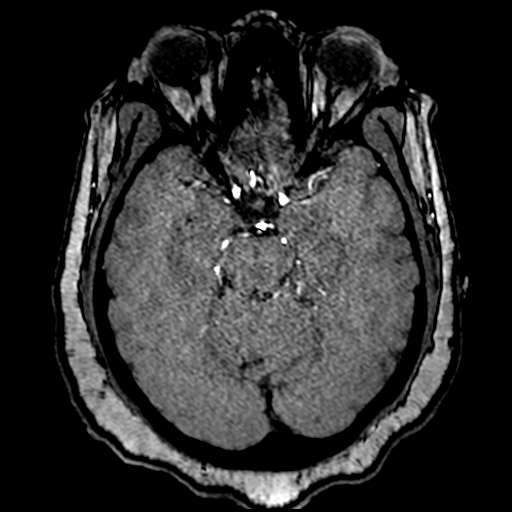
[im 108/188]
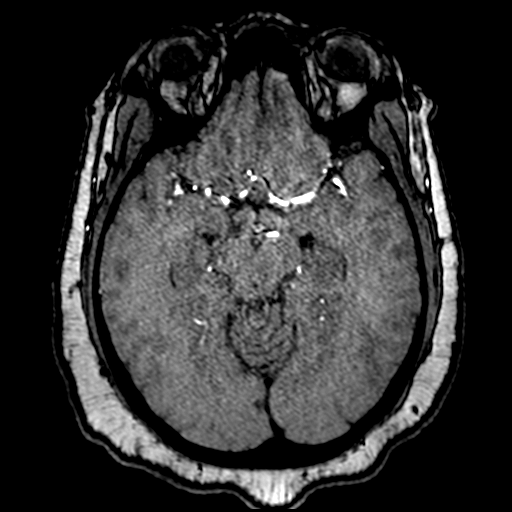
[im 132/188]
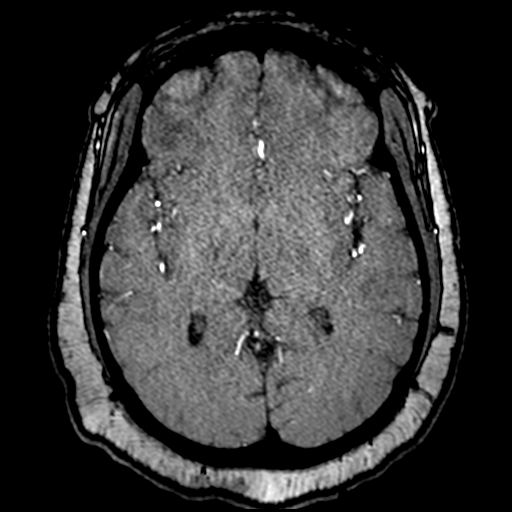
[im 156/188]
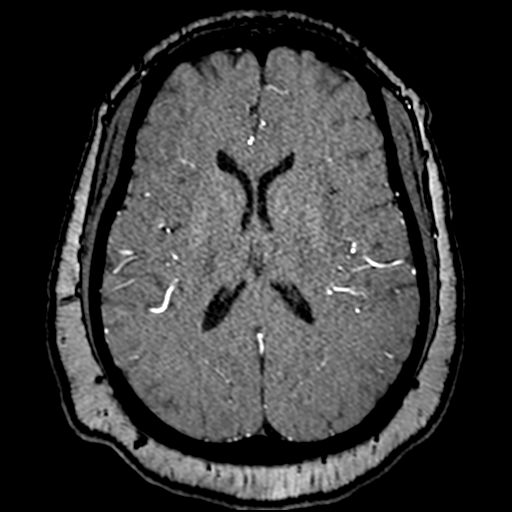
[im 160/188]
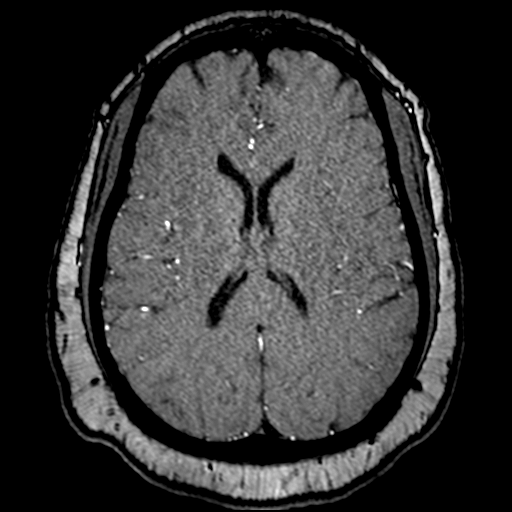
[im 180/188]
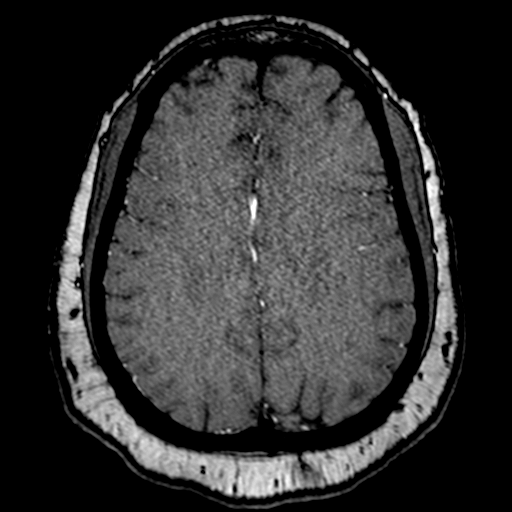

[19 of 48 positions shown; findings below may reference images not displayed]

FINDINGS: MRI HEAD FINDINGS

Brain: No acute infarct, mass effect or extra-axial collection. No
acute or chronic hemorrhage. Normal white matter signal, parenchymal
volume and CSF spaces. The midline structures are normal.

Vascular: Major flow voids are preserved.

Skull and upper cervical spine: Normal calvarium and skull base.
Visualized upper cervical spine and soft tissues are normal.

Sinuses/Orbits:No paranasal sinus fluid levels or advanced mucosal
thickening. No mastoid or middle ear effusion. Normal orbits.

MRA HEAD FINDINGS

POSTERIOR CIRCULATION:

--Vertebral arteries: Normal

--Inferior cerebellar arteries: Normal.

--Basilar artery: Normal.

--Superior cerebellar arteries: Normal.

--Posterior cerebral arteries: Normal.

ANTERIOR CIRCULATION:

--Intracranial internal carotid arteries: Normal.

--Anterior cerebral arteries (ACA): Normal.

--Middle cerebral arteries (MCA): Normal.

ANATOMIC VARIANTS: None

MRA NECK FINDINGS

The neck portion of the study is markedly motion degraded. The
vertebral arteries are codominant and patent. No carotid artery
stenosis is visible.
IMPRESSION: 1. Normal MRI of the brain.
2. Normal MRA of the head
3. Motion degraded MRA of the neck without visible high grade
stenosis.

## 2021-10-25 MED ORDER — ASPIRIN EC 325 MG PO TBEC
650.0000 mg | DELAYED_RELEASE_TABLET | Freq: Once | ORAL | Status: AC
  Administered 2021-10-26: 01:00:00 650 mg via ORAL
  Filled 2021-10-25: qty 2

## 2021-10-25 MED ORDER — LORAZEPAM 2 MG/ML IJ SOLN
2.0000 mg | Freq: Once | INTRAMUSCULAR | Status: AC
  Administered 2021-10-25: 23:00:00 2 mg via INTRAVENOUS
  Filled 2021-10-25: qty 1
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
## 2021-10-25 NOTE — ED Triage Notes (Signed)
Patient reports tingling and numbness at right side of body onset 8 pm this evening , speech clear , no facial asymmetry , no arm drift . Alert and oriented /respirations unlabored.
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
## 2021-10-25 NOTE — ED Notes (Signed)
Pt in MRI.

## 2021-10-25 NOTE — ED Provider Notes (Signed)
Care assumed from Dr. Hyacinth Meeker, patient with numbness in right arm pending MRI.  If no evidence of stroke, will need to be discharged.  MRI shows no evidence of stroke.  MR angiograms show no evidence of any blockages.  Paresthesias have improved.  She is felt to be safe for discharge.  Recommended isolation for 5 days because of COVID diagnosis.  Recommended follow-up with neurology if symptoms persist.  Return precautions discussed.  Results for orders placed or performed during the hospital encounter of 10/25/21  Resp Panel by RT-PCR (Flu A&B, Covid) Nasopharyngeal Swab   Specimen: Nasopharyngeal Swab; Nasopharyngeal(NP) swabs in vial transport medium  Result Value Ref Range   SARS Coronavirus 2 by RT PCR POSITIVE (A) NEGATIVE   Influenza A by PCR NEGATIVE NEGATIVE   Influenza B by PCR NEGATIVE NEGATIVE  Ethanol  Result Value Ref Range   Alcohol, Ethyl (B) <10 <10 mg/dL  Protime-INR  Result Value Ref Range   Prothrombin Time 12.5 11.4 - 15.2 seconds   INR 0.9 0.8 - 1.2  APTT  Result Value Ref Range   aPTT 28 24 - 36 seconds  CBC  Result Value Ref Range   WBC 15.8 (H) 4.0 - 10.5 K/uL   RBC 5.15 (H) 3.87 - 5.11 MIL/uL   Hemoglobin 13.1 12.0 - 15.0 g/dL   HCT 30.0 76.2 - 26.3 %   MCV 81.4 80.0 - 100.0 fL   MCH 25.4 (L) 26.0 - 34.0 pg   MCHC 31.3 30.0 - 36.0 g/dL   RDW 33.5 45.6 - 25.6 %   Platelets 336 150 - 400 K/uL   nRBC 0.0 0.0 - 0.2 %  Differential  Result Value Ref Range   Neutrophils Relative % 57 %   Neutro Abs 8.9 (H) 1.7 - 7.7 K/uL   Lymphocytes Relative 35 %   Lymphs Abs 5.6 (H) 0.7 - 4.0 K/uL   Monocytes Relative 6 %   Monocytes Absolute 1.0 0.1 - 1.0 K/uL   Eosinophils Relative 2 %   Eosinophils Absolute 0.3 0.0 - 0.5 K/uL   Basophils Relative 0 %   Basophils Absolute 0.1 0.0 - 0.1 K/uL   Immature Granulocytes 0 %   Abs Immature Granulocytes 0.05 0.00 - 0.07 K/uL  Comprehensive metabolic panel  Result Value Ref Range   Sodium 142 135 - 145 mmol/L    Potassium 3.8 3.5 - 5.1 mmol/L   Chloride 109 98 - 111 mmol/L   CO2 24 22 - 32 mmol/L   Glucose, Bld 103 (H) 70 - 99 mg/dL   BUN 9 6 - 20 mg/dL   Creatinine, Ser 3.89 0.44 - 1.00 mg/dL   Calcium 8.4 (L) 8.9 - 10.3 mg/dL   Total Protein 6.9 6.5 - 8.1 g/dL   Albumin 3.9 3.5 - 5.0 g/dL   AST 20 15 - 41 U/L   ALT 20 0 - 44 U/L   Alkaline Phosphatase 62 38 - 126 U/L   Total Bilirubin 0.8 0.3 - 1.2 mg/dL   GFR, Estimated >37 >34 mL/min   Anion gap 9 5 - 15  Urine rapid drug screen (hosp performed)  Result Value Ref Range   Opiates NONE DETECTED NONE DETECTED   Cocaine NONE DETECTED NONE DETECTED   Benzodiazepines NONE DETECTED NONE DETECTED   Amphetamines NONE DETECTED NONE DETECTED   Tetrahydrocannabinol POSITIVE (A) NONE DETECTED   Barbiturates NONE DETECTED NONE DETECTED  Urinalysis, Routine w reflex microscopic Nasopharyngeal Swab  Result Value Ref Range   Color, Urine  YELLOW YELLOW   APPearance HAZY (A) CLEAR   Specific Gravity, Urine 1.026 1.005 - 1.030   pH 6.0 5.0 - 8.0   Glucose, UA NEGATIVE NEGATIVE mg/dL   Hgb urine dipstick LARGE (A) NEGATIVE   Bilirubin Urine NEGATIVE NEGATIVE   Ketones, ur NEGATIVE NEGATIVE mg/dL   Protein, ur 30 (A) NEGATIVE mg/dL   Nitrite NEGATIVE NEGATIVE   Leukocytes,Ua MODERATE (A) NEGATIVE   RBC / HPF >50 (H) 0 - 5 RBC/hpf   WBC, UA 21-50 0 - 5 WBC/hpf   Bacteria, UA FEW (A) NONE SEEN   Squamous Epithelial / LPF 0-5 0 - 5   Mucus PRESENT   CBG monitoring, ED  Result Value Ref Range   Glucose-Capillary 106 (H) 70 - 99 mg/dL  I-stat chem 8, ED  Result Value Ref Range   Sodium 141 135 - 145 mmol/L   Potassium 3.9 3.5 - 5.1 mmol/L   Chloride 106 98 - 111 mmol/L   BUN 10 6 - 20 mg/dL   Creatinine, Ser 9.14 0.44 - 1.00 mg/dL   Glucose, Bld 98 70 - 99 mg/dL   Calcium, Ion 7.82 (L) 1.15 - 1.40 mmol/L   TCO2 26 22 - 32 mmol/L   Hemoglobin 15.0 12.0 - 15.0 g/dL   HCT 95.6 21.3 - 08.6 %  I-Stat beta hCG blood, ED  Result Value Ref Range    I-stat hCG, quantitative <5.0 <5 mIU/mL   Comment 3           MR ANGIO HEAD WO CONTRAST  Result Date: 10/26/2021 CLINICAL DATA:  Acute neurologic deficit EXAM: MRI HEAD WITHOUT CONTRAST MRA HEAD WITHOUT CONTRAST MRA NECK WITHOUT CONTRAST TECHNIQUE: Multiplanar, multi-echo pulse sequences of the brain and surrounding structures were acquired without intravenous contrast. Angiographic images of the Circle of Willis were acquired using MRA technique without intravenous contrast. Angiographic images of the neck were acquired using MRA technique without intravenous contrast. Carotid stenosis measurements (when applicable) are obtained utilizing NASCET criteria, using the distal internal carotid diameter as the denominator. COMPARISON:  Head CT 10/25/2021 FINDINGS: MRI HEAD FINDINGS Brain: No acute infarct, mass effect or extra-axial collection. No acute or chronic hemorrhage. Normal white matter signal, parenchymal volume and CSF spaces. The midline structures are normal. Vascular: Major flow voids are preserved. Skull and upper cervical spine: Normal calvarium and skull base. Visualized upper cervical spine and soft tissues are normal. Sinuses/Orbits:No paranasal sinus fluid levels or advanced mucosal thickening. No mastoid or middle ear effusion. Normal orbits. MRA HEAD FINDINGS POSTERIOR CIRCULATION: --Vertebral arteries: Normal --Inferior cerebellar arteries: Normal. --Basilar artery: Normal. --Superior cerebellar arteries: Normal. --Posterior cerebral arteries: Normal. ANTERIOR CIRCULATION: --Intracranial internal carotid arteries: Normal. --Anterior cerebral arteries (ACA): Normal. --Middle cerebral arteries (MCA): Normal. ANATOMIC VARIANTS: None MRA NECK FINDINGS The neck portion of the study is markedly motion degraded. The vertebral arteries are codominant and patent. No carotid artery stenosis is visible. IMPRESSION: 1. Normal MRI of the brain. 2. Normal MRA of the head 3. Motion degraded MRA of the neck  without visible high grade stenosis. Electronically Signed   By: Deatra Robinson M.D.   On: 10/26/2021 01:31   MR ANGIO NECK WO CONTRAST  Result Date: 10/26/2021 CLINICAL DATA:  Acute neurologic deficit EXAM: MRI HEAD WITHOUT CONTRAST MRA HEAD WITHOUT CONTRAST MRA NECK WITHOUT CONTRAST TECHNIQUE: Multiplanar, multi-echo pulse sequences of the brain and surrounding structures were acquired without intravenous contrast. Angiographic images of the Circle of Willis were acquired using MRA technique without intravenous contrast.  Angiographic images of the neck were acquired using MRA technique without intravenous contrast. Carotid stenosis measurements (when applicable) are obtained utilizing NASCET criteria, using the distal internal carotid diameter as the denominator. COMPARISON:  Head CT 10/25/2021 FINDINGS: MRI HEAD FINDINGS Brain: No acute infarct, mass effect or extra-axial collection. No acute or chronic hemorrhage. Normal white matter signal, parenchymal volume and CSF spaces. The midline structures are normal. Vascular: Major flow voids are preserved. Skull and upper cervical spine: Normal calvarium and skull base. Visualized upper cervical spine and soft tissues are normal. Sinuses/Orbits:No paranasal sinus fluid levels or advanced mucosal thickening. No mastoid or middle ear effusion. Normal orbits. MRA HEAD FINDINGS POSTERIOR CIRCULATION: --Vertebral arteries: Normal --Inferior cerebellar arteries: Normal. --Basilar artery: Normal. --Superior cerebellar arteries: Normal. --Posterior cerebral arteries: Normal. ANTERIOR CIRCULATION: --Intracranial internal carotid arteries: Normal. --Anterior cerebral arteries (ACA): Normal. --Middle cerebral arteries (MCA): Normal. ANATOMIC VARIANTS: None MRA NECK FINDINGS The neck portion of the study is markedly motion degraded. The vertebral arteries are codominant and patent. No carotid artery stenosis is visible. IMPRESSION: 1. Normal MRI of the brain. 2. Normal MRA  of the head 3. Motion degraded MRA of the neck without visible high grade stenosis. Electronically Signed   By: Deatra Robinson M.D.   On: 10/26/2021 01:31   MR BRAIN WO CONTRAST  Result Date: 10/26/2021 CLINICAL DATA:  Acute neurologic deficit EXAM: MRI HEAD WITHOUT CONTRAST MRA HEAD WITHOUT CONTRAST MRA NECK WITHOUT CONTRAST TECHNIQUE: Multiplanar, multi-echo pulse sequences of the brain and surrounding structures were acquired without intravenous contrast. Angiographic images of the Circle of Willis were acquired using MRA technique without intravenous contrast. Angiographic images of the neck were acquired using MRA technique without intravenous contrast. Carotid stenosis measurements (when applicable) are obtained utilizing NASCET criteria, using the distal internal carotid diameter as the denominator. COMPARISON:  Head CT 10/25/2021 FINDINGS: MRI HEAD FINDINGS Brain: No acute infarct, mass effect or extra-axial collection. No acute or chronic hemorrhage. Normal white matter signal, parenchymal volume and CSF spaces. The midline structures are normal. Vascular: Major flow voids are preserved. Skull and upper cervical spine: Normal calvarium and skull base. Visualized upper cervical spine and soft tissues are normal. Sinuses/Orbits:No paranasal sinus fluid levels or advanced mucosal thickening. No mastoid or middle ear effusion. Normal orbits. MRA HEAD FINDINGS POSTERIOR CIRCULATION: --Vertebral arteries: Normal --Inferior cerebellar arteries: Normal. --Basilar artery: Normal. --Superior cerebellar arteries: Normal. --Posterior cerebral arteries: Normal. ANTERIOR CIRCULATION: --Intracranial internal carotid arteries: Normal. --Anterior cerebral arteries (ACA): Normal. --Middle cerebral arteries (MCA): Normal. ANATOMIC VARIANTS: None MRA NECK FINDINGS The neck portion of the study is markedly motion degraded. The vertebral arteries are codominant and patent. No carotid artery stenosis is visible. IMPRESSION:  1. Normal MRI of the brain. 2. Normal MRA of the head 3. Motion degraded MRA of the neck without visible high grade stenosis. Electronically Signed   By: Deatra Robinson M.D.   On: 10/26/2021 01:31   CT HEAD CODE STROKE WO CONTRAST  Result Date: 10/25/2021 CLINICAL DATA:  Code stroke.  Right-sided numbness EXAM: CT HEAD WITHOUT CONTRAST TECHNIQUE: Contiguous axial images were obtained from the base of the skull through the vertex without intravenous contrast. COMPARISON:  None. FINDINGS: Brain: There is no mass, hemorrhage or extra-axial collection. The size and configuration of the ventricles and extra-axial CSF spaces are normal. The brain parenchyma is normal, without evidence of acute or chronic infarction. Vascular: No abnormal hyperdensity of the major intracranial arteries or dural venous sinuses. No intracranial atherosclerosis. Skull: The visualized  skull base, calvarium and extracranial soft tissues are normal. Sinuses/Orbits: No fluid levels or advanced mucosal thickening of the visualized paranasal sinuses. No mastoid or middle ear effusion. The orbits are normal. ASPECTS [facility] Stroke Program Early CT Score) - Ganglionic level infarction (caudate, lentiform nuclei, internal capsule, insula, M1-M3 cortex): 7 - Supraganglionic infarction (M4-M6 cortex): 3 Total score (0-10 with 10 being normal): 10 IMPRESSION: 1. Normal head CT. 2. ASPECTS is 10. Dr. Marisue Humble paged at 10 o'clock p.m. Electronically Signed   By: Deatra Robinson M.D.   On: 10/25/2021 22:02    Images viewed by me.    Dione Booze, MD 10/26/21 [phone]

## 2021-10-25 NOTE — ED Provider Notes (Addendum)
Emergency Medicine Provider Triage Evaluation Note  Ariana Rogers , a 35 y.o. female  was evaluated in triage.  Pt complains of sudden onset right-sided facial and upper extremity tingling and sensation of weakness since approximately 730 this evening.  Last known well is 730.  No history of similar symptoms in the past.  Patient anxious, concerned she may be having a stroke..  Review of Systems  Positive: Right upper extremity numbness, tingling, weakness, right-sided facial decree sensation Negative: Chest pain shortness of breath and palpitation, blurred or double vision, syncope  Physical Exam  BP (!) 157/112 (BP Location: Left Arm)    Pulse 85    Temp 98 F (36.7 C) (Oral)    Resp 17    Ht 5\' 5"  (1.651 m)    Wt (!) 140 kg    LMP 10/24/2021    SpO2 98%    BMI 51.36 kg/m  Gen:   Awake, no distress   Resp:  Normal effort  MSK:   Moves extremities without difficulty  Other:  Cranial nerves are intact with the exception of decreased sensation on the right half of the face.  PERRL, EOMI.  Decreased sensation in the right hand and all 5 digits with associated paresthesias.  Right-sided pronator drift, symmetric upper extremity strength in elbow flexion and extension as well as grip bilaterally.  Symmetric sensation and strength in lower extremities bilaterally. Normal RAM in bilateral hands, normal coordination. Normal finger-nose.   Medical Decision Making  Medically screening exam initiated at 9:33 PM.  Appropriate orders placed.  Ariana Rogers was informed that the remainder of the evaluation will be completed by another provider, this initial triage assessment does not replace that evaluation, and the importance of remaining in the ED until their evaluation is complete.  Patient at low risk for CVA, however given she is within the window and has a positive drift on her exam I have activated code stroke.  Charge RN made aware; patient to CT.  This chart was dictated using voice recognition  software, Dragon. Despite the best efforts of this provider to proofread and correct errors, errors may still occur which can change documentation meaning.     10/26/2021 10/25/21 2139    2140, MD 10/25/21 [phone]

## 2021-10-26 NOTE — Discharge Instructions (Addendum)
Return if your symptoms are getting worse. 

## 2021-11-17 ENCOUNTER — Encounter: Payer: Self-pay | Admitting: Nurse Practitioner

## 2021-11-17 ENCOUNTER — Other Ambulatory Visit: Payer: Self-pay

## 2021-11-17 ENCOUNTER — Other Ambulatory Visit (HOSPITAL_COMMUNITY): Payer: Self-pay

## 2021-11-17 ENCOUNTER — Ambulatory Visit: Payer: 59 | Admitting: Nurse Practitioner

## 2021-11-17 VITALS — BP 130/94 | HR 82 | Temp 98.8°F | Ht 65.0 in | Wt 288.6 lb

## 2021-11-17 DIAGNOSIS — Z13220 Encounter for screening for lipoid disorders: Secondary | ICD-10-CM

## 2021-11-17 DIAGNOSIS — I10 Essential (primary) hypertension: Secondary | ICD-10-CM | POA: Diagnosis not present

## 2021-11-17 DIAGNOSIS — E119 Type 2 diabetes mellitus without complications: Secondary | ICD-10-CM | POA: Diagnosis not present

## 2021-11-17 DIAGNOSIS — Z6841 Body Mass Index (BMI) 40.0 and over, adult: Secondary | ICD-10-CM

## 2021-11-17 LAB — POCT GLYCOSYLATED HEMOGLOBIN (HGB A1C)
HbA1c POC (<> result, manual entry): 6.6 % (ref 4.0–5.6)
HbA1c, POC (controlled diabetic range): 6.6 % (ref 0.0–7.0)
HbA1c, POC (prediabetic range): 6.6 % — AB (ref 5.7–6.4)
Hemoglobin A1C: 6.6 % — AB (ref 4.0–5.6)

## 2021-11-17 MED ORDER — AMLODIPINE BESYLATE 5 MG PO TABS
5.0000 mg | ORAL_TABLET | Freq: Every day | ORAL | 1 refills | Status: DC
Start: 2021-11-17 — End: 2022-02-13
  Filled 2021-11-17: qty 90, 90d supply, fill #0

## 2021-11-17 MED ORDER — BENZONATATE 100 MG PO CAPS
100.0000 mg | ORAL_CAPSULE | Freq: Three times a day (TID) | ORAL | 0 refills | Status: AC | PRN
Start: 2021-11-17 — End: 2021-11-27
  Filled 2021-11-17: qty 30, 10d supply, fill #0

## 2021-11-17 NOTE — Patient Instructions (Addendum)
Managing Your Hypertension °Hypertension, also called high blood pressure, is when the force of the blood pressing against the walls of the arteries is too strong. Arteries are blood vessels that carry blood from your heart throughout your body. Hypertension forces the heart to work harder to pump blood and may cause the arteries to become narrow or stiff. °Understanding blood pressure readings °Your personal target blood pressure may vary depending on your medical conditions, your age, and other factors. A blood pressure reading includes a higher number over a lower number. Ideally, your blood pressure should be below 120/80. You should know that: °The first, or top, number is called the systolic pressure. It is a measure of the pressure in your arteries as your heart beats. °The second, or bottom number, is called the diastolic pressure. It is a measure of the pressure in your arteries as the heart relaxes. °Blood pressure is classified into four stages. Based on your blood pressure reading, your health care provider may use the following stages to determine what type of treatment you need, if any. Systolic pressure and diastolic pressure are measured in a unit called mmHg. °Normal °Systolic pressure: below 120. °Diastolic pressure: below 80. °Elevated °Systolic pressure: 120-129. °Diastolic pressure: below 80. °Hypertension stage 1 °Systolic pressure: 130-139. °Diastolic pressure: 80-89. °Hypertension stage 2 °Systolic pressure: 140 or above. °Diastolic pressure: 90 or above. °How can this condition affect me? °Managing your hypertension is an important responsibility. Over time, hypertension can damage the arteries and decrease blood flow to important parts of the body, including the brain, heart, and kidneys. Having untreated or uncontrolled hypertension can lead to: °A heart attack. °A stroke. °A weakened blood vessel (aneurysm). °Heart failure. °Kidney damage. °Eye damage. °Metabolic syndrome. °Memory and  concentration problems. °Vascular dementia. °What actions can I take to manage this condition? °Hypertension can be managed by making lifestyle changes and possibly by taking medicines. Your health care provider will help you make a plan to bring your blood pressure within a normal range. °Nutrition ° °Eat a diet that is high in fiber and potassium, and low in salt (sodium), added sugar, and fat. An example eating plan is called the Dietary Approaches to Stop Hypertension (DASH) diet. To eat this way: °Eat plenty of fresh fruits and vegetables. Try to fill one-half of your plate at each meal with fruits and vegetables. °Eat whole grains, such as whole-wheat pasta, brown rice, or whole-grain bread. Fill about one-fourth of your plate with whole grains. °Eat low-fat dairy products. °Avoid fatty cuts of meat, processed or cured meats, and poultry with skin. Fill about one-fourth of your plate with lean proteins such as fish, chicken without skin, beans, eggs, and tofu. °Avoid pre-made and processed foods. These tend to be higher in sodium, added sugar, and fat. °Reduce your daily sodium intake. Most people with hypertension should eat less than 1,500 mg of sodium a day. °Lifestyle ° °Work with your health care provider to maintain a healthy body weight or to lose weight. Ask what an ideal weight is for you. °Get at least 30 minutes of exercise that causes your heart to beat faster (aerobic exercise) most days of the week. Activities may include walking, swimming, or biking. °Include exercise to strengthen your muscles (resistance exercise), such as weight lifting, as part of your weekly exercise routine. Try to do these types of exercises for 30 minutes at least 3 days a week. °Do not use any products that contain nicotine or tobacco, such as cigarettes, e-cigarettes,   and chewing tobacco. If you need help quitting, ask your health care provider. Control any long-term (chronic) conditions you have, such as high  cholesterol or diabetes. Identify your sources of stress and find ways to manage stress. This may include meditation, deep breathing, or making time for fun activities. Alcohol use Do not drink alcohol if: Your health care provider tells you not to drink. You are pregnant, may be pregnant, or are planning to become pregnant. If you drink alcohol: Limit how much you use to: 0-1 drink a day for women. 0-2 drinks a day for men. Be aware of how much alcohol is in your drink. In the [place] one drink equals one 12 oz bottle of beer (355 mL), one 5 oz glass of wine (148 mL), or one 1 oz glass of hard liquor (44 mL). Medicines Your health care provider may prescribe medicine if lifestyle changes are not enough to get your blood pressure under control and if: Your systolic blood pressure is 130 or higher. Your diastolic blood pressure is 80 or higher. Take medicines only as told by your health care provider. Follow the directions carefully. Blood pressure medicines must be taken as told by your health care provider. The medicine does not work as well when you skip doses. Skipping doses also puts you at risk for problems. Monitoring Before you monitor your blood pressure: Do not smoke, drink caffeinated beverages, or exercise within 30 minutes before taking a measurement. Use the bathroom and empty your bladder (urinate). Sit quietly for at least 5 minutes before taking measurements. Monitor your blood pressure at home as told by your health care provider. To do this: Sit with your back straight and supported. Place your feet flat on the floor. Do not cross your legs. Support your arm on a flat surface, such as a table. Make sure your upper arm is at heart level. Each time you measure, take two or three readings one minute apart and record the results. You may also need to have your blood pressure checked regularly by your health care provider. General information Talk with your health care  provider about your diet, exercise habits, and other lifestyle factors that may be contributing to hypertension. Review all the medicines you take with your health care provider because there may be side effects or interactions. Keep all visits as told by your health care provider. Your health care provider can help you create and adjust your plan for managing your high blood pressure. Where to find more information National Heart, Lung, and Blood Institute: https://wilson-eaton.com/ American Heart Association: www.heart.org Contact a health care provider if: You think you are having a reaction to medicines you have taken. You have repeated (recurrent) headaches. You feel dizzy. You have swelling in your ankles. You have trouble with your vision. Get help right away if: You develop a severe headache or confusion. You have unusual weakness or numbness, or you feel faint. You have severe pain in your chest or abdomen. You vomit repeatedly. You have trouble breathing. These symptoms may represent a serious problem that is an emergency. Do not wait to see if the symptoms will go away. Get medical help right away. Call your local emergency services (911 in the [place] Do not drive yourself to the hospital. Summary Hypertension is when the force of blood pumping through your arteries is too strong. If this condition is not controlled, it may put you at risk for serious complications. Your personal target blood pressure may vary depending on  your medical conditions, your age, and other factors. For most people, a normal blood pressure is less than 120/80. Hypertension is managed by lifestyle changes, medicines, or both. Lifestyle changes to help manage hypertension include losing weight, eating a healthy, low-sodium diet, exercising more, stopping smoking, and limiting alcohol. This information is not intended to replace advice given to you by your health care provider. Make sure you discuss any questions  you have with your health care provider. Document Revised: 11/03/2019 Document Reviewed: 09/16/2019 Elsevier Patient Education  2022 [place].   Breast Tenderness Breast tenderness is a common problem for women of all ages, but may also occur in men. Breast tenderness may range from mild discomfort to severe pain. In women, the pain usually comes and goes with the menstrual cycle, but it can also be constant. Breast tenderness has many possible causes, including hormone changes, infections, and taking certain medicines. You may have tests, such as a mammogram or an ultrasound, to check for any unusual findings. Having breast tenderness usually does not mean that you have breast cancer. Follow these instructions at home: Managing pain and discomfort  If directed, put ice to the painful area. To do this: Put ice in a plastic bag. Place a towel between your skin and the bag. Leave the ice on for 20 minutes, 2-3 times a day. Wear a supportive bra, especially during exercise. You may also want to wear a supportive bra while sleeping if your breasts are very tender. Medicines Take over-the-counter and prescription medicines only as told by your health care provider. If the cause of your pain is infection, you may be prescribed an antibiotic medicine. If you were prescribed an antibiotic, take it as told by your health care provider. Do not stop taking the antibiotic even if you start to feel better. Eating and drinking Your health care provider may recommend that you lessen the amount of fat in your diet. You can do this by: Limiting fried foods. Cooking foods using methods such as baking, boiling, grilling, and broiling. Decrease the amount of caffeine in your diet. Instead, drink more water and choose caffeine-free drinks. General instructions  Keep a log of the days and times when your breasts are most tender. Ask your health care provider how to do breast exams at home. This will help  you notice if you have an unusual growth or lump. Keep all follow-up visits as told by your health care provider. This is important. Contact a health care provider if: Any part of your breast is hard, red, and hot to the touch. This may be a sign of infection. You are a woman and: Not breastfeeding and you have fluid, especially blood or pus, coming out of your nipples. Have a new or painful lump in your breast that remains after your menstrual period ends. You have a fever. Your pain does not improve or it gets worse. Your pain is interfering with your daily activities. Summary Breast tenderness may range from mild discomfort to severe pain. Breast tenderness has many possible causes, including hormone changes, infections, and taking certain medicines. It can be treated with ice, wearing a supportive bra, and medicines. Make changes to your diet if told to by your health care provider. This information is not intended to replace advice given to you by your health care provider. Make sure you discuss any questions you have with your health care provider. Document Revised: 03/10/2019 Document Reviewed: 03/10/2019 Elsevier Patient Education  [place].  Vitamin  E oil for the breast pain

## 2021-11-17 NOTE — Progress Notes (Signed)
[place] [place], [place]  [id number] Phone:  [phone]   Fax:  [phone]   Established Patient Office Visit  Subjective:  Patient ID: Ariana Rogers, female    DOB: 1986-01-14  Age: 36 y.o. MRN: [id number]  CC:  Chief Complaint  Patient presents with   Follow-up    Pt is here today for her 3 month follow up visit. Pt states that her left nipple has been sore since last Sunday but no discharge from the nipple.     HPI Ariana Rogers presents for follow up. She  has a past medical history of Acute bronchitis, Cough (03/2019), GERD (gastroesophageal reflux disease) (03/2019), Left breast mass, Smoker, and Vitamin D deficiency (09/2020).   She ws seen in the ED. She was classified as a Code stroke and imaging was completed. Her BP was elevated and she was not provided any treatment.   She reports that she is getting overly angry. She is tired. She needs a break for the daily cares of life. She has tied lifestyle changes; stopped eating after 8 pm. She is trying to   She reports that her cough has returned. She reports a tingling feeling in her throat. She is using cough drops, hot tea and honey. This is effective for a little while. She does have allergies but does not feel like this is related. She has a history of swelling but she feels like it related to her past knee injures.   She has chest xray order but has not been completed. She denies any shortness of breath. Denies headache, dizziness, visual changes,  chest pain, nausea, vomiting or any edema.   She does experience some anxiety. She would like something to help her loss weight and stay focused. She hasleft nipple soreness since Sunday. She does not feel anything abnormal. She does have normal cycles; LMP 10/24/22.    Past Medical History:  Diagnosis Date   Acute bronchitis    Cough 03/2019   GERD (gastroesophageal reflux disease) 03/2019   Left breast mass    Smoker    Vitamin D  deficiency 09/2020    Past Surgical History:  Procedure Laterality Date   DENTAL SURGERY     TUBAL LIGATION  2 years ago    Family History  Problem Relation Age of Onset   Hypertension Mother    Arthritis Mother    Fibromyalgia Mother    Other Father        no health concern    Social History   Socioeconomic History   Marital status: Single    Spouse name: Not on file   Number of children: 4   Years of education: Not on file   Highest education level: Some college, no degree  Occupational History   Not on file  Tobacco Use   Smoking status: Former    Packs/day: 0.50    Types: Cigarettes    Quit date: 03/30/2021    Years since quitting: 0.6   Smokeless tobacco: Never  Vaping Use   Vaping Use: Never used  Substance and Sexual Activity   Alcohol use: Yes    Comment: occasionally   Drug use: Yes    Types: Marijuana    Comment: edibles   Sexual activity: Yes    Birth control/protection: Surgical  Other Topics Concern   Not on file  Social History Narrative   Not on file   Social Determinants of Radio broadcast assistant  Strain: Not on file  Food Insecurity: Not on file  Transportation Needs: Not on file  Physical Activity: Not on file  Stress: Not on file  Social Connections: Not on file  Intimate Partner Violence: Not on file    Outpatient Medications Prior to Visit  Medication Sig Dispense Refill   Blood Glucose Monitoring Suppl (BLOOD GLUCOSE MONITOR SYSTEM) w/Device KIT Use up to 4 (four) times daily as directed. 1 kit 0   cetirizine (ZYRTEC) 10 MG chewable tablet Chew 10 mg by mouth daily.     fluticasone (FLONASE) 50 MCG/ACT nasal spray Place 2 sprays into both nostrils daily. 16 g 6   glucose blood test strip Use up to 4 (four) times daily as directed. 100 each 0   Lancets (FREESTYLE) lancets Use up to 4 (four) times daily as directed. 100 each 0   No facility-administered medications prior to visit.    No Known Allergies  ROS Review of  Systems    Objective:    Physical Exam Constitutional:      Appearance: She is obese.  HENT:     Head: Normocephalic and atraumatic.  Cardiovascular:     Rate and Rhythm: Normal rate and regular rhythm.     Pulses: Normal pulses.     Heart sounds: Normal heart sounds.  Pulmonary:     Effort: Pulmonary effort is normal.     Comments: Diminished  Musculoskeletal:        General: Normal range of motion.     Cervical back: Normal range of motion.     Right lower leg: No edema.     Left lower leg: No edema.  Skin:    General: Skin is warm and dry.     Capillary Refill: Capillary refill takes less than 2 seconds.  Neurological:     General: No focal deficit present.     Mental Status: She is alert.    BP (!) 130/94    Pulse 82    Temp 98.8 F (37.1 C)    Ht '5\' 5"'  (1.651 m)    Wt 288 lb 9.6 oz (130.9 kg)    LMP 10/24/2021    BMI 48.03 kg/m  Wt Readings from Last 3 Encounters:  11/17/21 288 lb 9.6 oz (130.9 kg)  10/25/21 (!) 308 lb 10.3 oz (140 kg)  08/17/21 296 lb 12.8 oz (134.6 kg)     Health Maintenance Due  Topic Date Due   URINE MICROALBUMIN  Never done    There are no preventive care reminders to display for this patient.  Lab Results  Component Value Date   TSH 1.170 04/21/2019   Lab Results  Component Value Date   WBC 15.8 (H) 10/25/2021   HGB 15.0 10/25/2021   HCT 44.0 10/25/2021   MCV 81.4 10/25/2021   PLT 336 10/25/2021   Lab Results  Component Value Date   NA 141 10/25/2021   K 3.9 10/25/2021   CO2 24 10/25/2021   GLUCOSE 98 10/25/2021   BUN 10 10/25/2021   CREATININE 0.80 10/25/2021   BILITOT 0.8 10/25/2021   ALKPHOS 62 10/25/2021   AST 20 10/25/2021   ALT 20 10/25/2021   PROT 6.9 10/25/2021   ALBUMIN 3.9 10/25/2021   CALCIUM 8.4 (L) 10/25/2021   ANIONGAP 9 10/25/2021   Lab Results  Component Value Date   CHOL 155 04/21/2019   Lab Results  Component Value Date   HDL 39 (L) 04/21/2019   Lab Results  Component Value Date  [place] 98 04/21/2019   Lab Results  Component Value Date   TRIG 91 04/21/2019   Lab Results  Component Value Date   CHOLHDL 4.0 04/21/2019   Lab Results  Component Value Date   HGBA1C 6.6 (A) 11/17/2021   HGBA1C 6.6 11/17/2021   HGBA1C 6.6 (A) 11/17/2021   HGBA1C 6.6 11/17/2021      Assessment & Plan:   Problem List Items Addressed This Visit   None Visit Diagnoses     Primary hypertension    -  Primary Encouraged on going compliance with current medication regimen Encouraged home monitoring and recording BP <130/80 Eating a heart-healthy diet with less salt Encouraged regular physical activity  Recommend Weight loss     Relevant Medications   amLODipine (NORVASC) 5 MG tablet   Diabetes mellitus, new onset ([place])     Stable diet controlled Encourage regular CBG monitoring Encourage contacting office if excessive hyperglycemia and or hypoglycemia Lifestyle modification with healthy diet (fewer calories, more high fiber foods, whole grains and non-starchy vegetables, lower fat meat and fish, low-fat diary include healthy oils) regular exercise (physical activity) and weight loss Opthalmology exam discussed  Nutritional consult recommended Regular dental visits encouraged Home BP monitoring also encouraged goal <130/80    Relevant Orders   HgB A1c (Completed)   Microalbumin, urine   Morbid obesity with BMI of 45.0-49.9, adult ([place])   Obesity with BMI and comorbidities as noted above.  Discussed proper diet (low fat, low sodium, high fiber) with patient.   Discussed need for regular exercise (3 times per week, 20 minutes per session) with patient.     Screening for cholesterol level           Meds ordered this encounter  Medications   amLODipine (NORVASC) 5 MG tablet    Sig: Take 1 tablet (5 mg total) by mouth daily.    Dispense:  90 tablet    Refill:  1    Order Specific Question:   Supervising Provider    Answer:   Tresa Garter [[id number]]    benzonatate (TESSALON) 100 MG capsule    Sig: Take 1 capsule (100 mg total) by mouth 3 (three) times daily as needed for up to 10 days for cough. Never suck or chew on a benzonatate capsule.    Dispense:  30 capsule    Refill:  0    Do not place medication on "Automatic Refill". Please provide patient with medication counseling and recommendations.    Order Specific Question:   Supervising Provider    Answer:   Tresa Garter [[id number]]    Follow-up: Return in about 6 weeks (around 12/29/2021) for Follow up HTN [id number].    Vevelyn Francois, NP

## 2021-11-18 LAB — MICROALBUMIN, URINE: Microalbumin, Urine: 14.9 ug/mL

## 2021-11-23 ENCOUNTER — Ambulatory Visit: Payer: 59 | Admitting: Neurology

## 2021-12-13 ENCOUNTER — Encounter: Payer: Self-pay | Admitting: Neurology

## 2021-12-13 ENCOUNTER — Ambulatory Visit: Payer: 59 | Admitting: Neurology

## 2021-12-30 ENCOUNTER — Ambulatory Visit: Payer: Self-pay | Admitting: Nurse Practitioner

## 2022-02-13 ENCOUNTER — Other Ambulatory Visit (HOSPITAL_COMMUNITY): Payer: Self-pay

## 2022-02-13 ENCOUNTER — Telehealth: Payer: Self-pay | Admitting: Nurse Practitioner

## 2022-02-13 ENCOUNTER — Other Ambulatory Visit: Payer: Self-pay | Admitting: Nurse Practitioner

## 2022-02-13 MED ORDER — AMLODIPINE BESYLATE 5 MG PO TABS
5.0000 mg | ORAL_TABLET | Freq: Every day | ORAL | 0 refills | Status: DC
Start: 2022-02-13 — End: 2022-03-01
  Filled 2022-02-13: qty 10, 10d supply, fill #0

## 2022-02-13 NOTE — Telephone Encounter (Signed)
Great. Thanks. I sent her a few in to get to the appointment. ?

## 2022-02-13 NOTE — Telephone Encounter (Signed)
Pt is requesting bp meds refill request. She was a patient of Crystal's but I have her scheduled to come see you on Friday this week.  ?

## 2022-02-17 ENCOUNTER — Other Ambulatory Visit (HOSPITAL_COMMUNITY): Payer: Self-pay

## 2022-02-17 ENCOUNTER — Ambulatory Visit (INDEPENDENT_AMBULATORY_CARE_PROVIDER_SITE_OTHER): Payer: 59 | Admitting: Nurse Practitioner

## 2022-02-17 ENCOUNTER — Encounter: Payer: Self-pay | Admitting: Nurse Practitioner

## 2022-02-17 VITALS — BP 121/89 | HR 83 | Temp 98.8°F | Ht 65.0 in | Wt 291.4 lb

## 2022-02-17 DIAGNOSIS — Z1239 Encounter for other screening for malignant neoplasm of breast: Secondary | ICD-10-CM

## 2022-02-17 DIAGNOSIS — N921 Excessive and frequent menstruation with irregular cycle: Secondary | ICD-10-CM | POA: Diagnosis not present

## 2022-02-17 DIAGNOSIS — Z01419 Encounter for gynecological examination (general) (routine) without abnormal findings: Secondary | ICD-10-CM | POA: Diagnosis not present

## 2022-02-17 LAB — RESULTS CONSOLE HPV: CHL HPV: NEGATIVE

## 2022-02-17 LAB — HM PAP SMEAR

## 2022-02-17 NOTE — Patient Instructions (Addendum)
1. Visit for gynecologic examination ? 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 ?2. Encounter for screening breast examination ? ?Breast exam normal ? ?3. Heavy menstrual cycle ? ?Abdominal pain ? ?Referral placed to GYN ? ?Follow up: ? ?Follow up in 6 months or sooner if needed ? ? ? ?Health Maintenance, Female ?Adopting a healthy lifestyle and getting preventive care are important in promoting health and wellness. Ask your health care provider about: ?The right schedule for you to have regular tests and exams. ?Things you can do on your own to prevent diseases and keep yourself healthy. ?What should I know about diet, weight, and exercise? ?Eat a healthy diet ? ?Eat a diet that includes plenty of vegetables, fruits, low-fat dairy products, and lean protein. ?Do not eat a lot of foods that are high in solid fats, added sugars, or sodium. ?Maintain a healthy weight ?Body mass index (BMI) is used to identify weight problems. It estimates body fat based on height and weight. Your health care provider can help determine your BMI and help you achieve or maintain a healthy weight. ?Get regular exercise ?Get regular exercise. This is one of the most important things you can do for your health. Most adults should: ?Exercise for at least 150 minutes each week. The exercise should increase your heart rate and make you sweat (moderate-intensity exercise). ?Do strengthening exercises at least twice a week. This is in addition to the moderate-intensity exercise. ?Spend less time sitting. Even light physical activity can be beneficial. ?Watch cholesterol and blood lipids ?Have your blood tested for lipids and cholesterol at 37 years of age, then have this test every 5 years. ?Have your cholesterol levels checked more often if: ?Your lipid or cholesterol levels are high. ?You are older than 37 years of age. ?You are at high risk for heart disease. ?What should I know about cancer screening? ?Depending on  your health history and family history, you may need to have cancer screening at various ages. This may include screening for: ?Breast cancer. ?Cervical cancer. ?Colorectal cancer. ?Skin cancer. ?Lung cancer. ?What should I know about heart disease, diabetes, and high blood pressure? ?Blood pressure and heart disease ?High blood pressure causes heart disease and increases the risk of stroke. This is more likely to develop in people who have high blood pressure readings or are overweight. ?Have your blood pressure checked: ?Every 3-5 years if you are 64-70 years of age. ?Every year if you are 79 years old or older. ?Diabetes ?Have regular diabetes screenings. This checks your fasting blood sugar level. Have the screening done: ?Once every three years after age 65 if you are at a normal weight and have a low risk for diabetes. ?More often and at a younger age if you are overweight or have a high risk for diabetes. ?What should I know about preventing infection? ?Hepatitis B ?If you have a higher risk for hepatitis B, you should be screened for this virus. Talk with your health care provider to find out if you are at risk for hepatitis B infection. ?Hepatitis C ?Testing is recommended for: ?Everyone born from [birth year] through 1966. ?Anyone with known risk factors for hepatitis C. ?Sexually transmitted infections (STIs) ?Get screened for STIs, including gonorrhea and chlamydia, if: ?You are sexually active and are younger than 37 years of age. ?You are older than 37 years of age and your health care provider tells you that you are at risk for this type  of infection. ?Your sexual activity has changed since you were last screened, and you are at increased risk for chlamydia or gonorrhea. Ask your health care provider if you are at risk. ?Ask your health care provider about whether you are at high risk for HIV. Your health care provider may recommend a prescription medicine to help prevent HIV infection. If you choose to take  medicine to prevent HIV, you should first get tested for HIV. You should then be tested every 3 months for as long as you are taking the medicine. ?Pregnancy ?If you are about to stop having your period (premenopausal) and you may become pregnant, seek counseling before you get pregnant. ?Take 400 to 800 micrograms (mcg) of folic acid every day if you become pregnant. ?Ask for birth control (contraception) if you want to prevent pregnancy. ?Osteoporosis and menopause ?Osteoporosis is a disease in which the bones lose minerals and strength with aging. This can result in bone fractures. If you are 57 years old or older, or if you are at risk for osteoporosis and fractures, ask your health care provider if you should: ?Be screened for bone loss. ?Take a calcium or vitamin D supplement to lower your risk of fractures. ?Be given hormone replacement therapy (HRT) to treat symptoms of menopause. ?Follow these instructions at home: ?Alcohol use ?Do not drink alcohol if: ?Your health care provider tells you not to drink. ?You are pregnant, may be pregnant, or are planning to become pregnant. ?If you drink alcohol: ?Limit how much you have to: ?0-1 drink a day. ?Know how much alcohol is in your drink. In the [place] one drink equals one 12 oz bottle of beer (355 mL), one 5 oz glass of wine (148 mL), or one 1? oz glass of hard liquor (44 mL). ?Lifestyle ?Do not use any products that contain nicotine or tobacco. These products include cigarettes, chewing tobacco, and vaping devices, such as e-cigarettes. If you need help quitting, ask your health care provider. ?Do not use street drugs. ?Do not share needles. ?Ask your health care provider for help if you need support or information about quitting drugs. ?General instructions ?Schedule regular health, dental, and eye exams. ?Stay current with your vaccines. ?Tell your health care provider if: ?You often feel depressed. ?You have ever been abused or do not feel safe at  home. ?Summary ?Adopting a healthy lifestyle and getting preventive care are important in promoting health and wellness. ?Follow your health care provider's instructions about healthy diet, exercising, and getting tested or screened for diseases. ?Follow your health care provider's instructions on monitoring your cholesterol and blood pressure. ?This information is not intended to replace advice given to you by your health care provider. Make sure you discuss any questions you have with your health care provider. ?Document Revised: 03/07/2021 Document Reviewed: 03/07/2021 ?Elsevier Patient Education ? [place]. ? ? ?

## 2022-02-17 NOTE — Progress Notes (Signed)
'@Patient'  ID: Ariana Rogers, female    DOB: [birth year], 36 y.o.   MRN: [id number]  Chief Complaint  Patient presents with   Annual Exam    Patient is here today for her annual exam with pap smear and breast exam. Patient is needing her Amlodipine resent to her pharmacy as they never received it.    Referring provider: Vevelyn Francois, NP   HPI  Patient presents today for a physical with Pap smear.  She is also requesting breast exam.  Patient is requesting STD testing as well.  Patient is complaining of abnormally heavy menstrual cycles with abdominal pain.  We will refer her to OB/GYN for further evaluation. Denies f/c/s, n/v/d, hemoptysis, PND, leg swelling Denies chest pain or edema        No Known Allergies  Immunization History  Administered Date(s) Administered   Influenza,inj,Quad PF,6+ Mos 01/15/2018, 09/27/2020, 08/17/2021   PFIZER(Purple Top)SARS-COV-2 Vaccination 06/07/2020, 07/01/2020   PPD Test 03/26/2018   Pneumococcal Polysaccharide-23 08/17/2021   Tdap 01/15/2018    Past Medical History:  Diagnosis Date   Acute bronchitis    Cough 03/2019   GERD (gastroesophageal reflux disease) 03/2019   Left breast mass    Smoker    Vitamin D deficiency 09/2020    Tobacco History: Social History   Tobacco Use  Smoking Status Every Day   Packs/day: 0.50   Types: Cigarettes, Cigars   Last attempt to quit: 03/30/2021   Years since quitting: 1.3  Smokeless Tobacco Never  Tobacco Comments   Black and mild's     Ready to quit: Not Answered Counseling given: Not Answered Tobacco comments: Black and mild's     Outpatient Encounter Medications as of 02/17/2022  Medication Sig   Blood Glucose Monitoring Suppl (BLOOD GLUCOSE MONITOR SYSTEM) w/Device KIT Use up to 4 (four) times daily as directed.   cetirizine (ZYRTEC) 10 MG chewable tablet Chew 10 mg by mouth daily.   fluticasone (FLONASE) 50 MCG/ACT nasal spray Place 2 sprays into both nostrils daily.   glucose  blood test strip Use up to 4 (four) times daily as directed.   Lancets (FREESTYLE) lancets Use up to 4 (four) times daily as directed.   [DISCONTINUED] amLODipine (NORVASC) 5 MG tablet Take 1 tablet (5 mg total) by mouth daily for 10 days.   No facility-administered encounter medications on file as of 02/17/2022.     Review of Systems  Review of Systems  Constitutional: Negative.   HENT: Negative.    Cardiovascular: Negative.   Gastrointestinal: Negative.   Allergic/Immunologic: Negative.   Neurological: Negative.   Psychiatric/Behavioral: Negative.         Physical Exam  BP 121/89   Pulse 83   Temp 98.8 F (37.1 C)   Ht '5\' 5"'  (1.651 m)   Wt 291 lb 6.4 oz (132.2 kg)   SpO2 100%   BMI 48.49 kg/m   Wt Readings from Last 5 Encounters:  02/17/22 291 lb 6.4 oz (132.2 kg)  11/17/21 288 lb 9.6 oz (130.9 kg)  10/25/21 (!) 308 lb 10.3 oz (140 kg)  08/17/21 296 lb 12.8 oz (134.6 kg)  09/27/20 288 lb 3.2 oz (130.7 kg)     Physical Exam Vitals and nursing note reviewed. Exam conducted with a chaperone present.  Constitutional:      General: She is not in acute distress.    Appearance: She is well-developed.  Cardiovascular:     Rate and Rhythm: Normal rate and regular rhythm.  Pulmonary:  Effort: Pulmonary effort is normal.     Breath sounds: Normal breath sounds.  Chest:     Chest wall: No mass or tenderness.  Genitourinary:    General: Normal vulva.     Vagina: Normal.     Cervix: Normal.     Uterus: Normal.      Adnexa: Right adnexa normal and left adnexa normal.  Neurological:     Mental Status: She is alert and oriented to person, place, and time.      Lab Results:  CBC    Component Value Date/Time   WBC 15.8 (H) 10/25/2021 2137   RBC 5.15 (H) 10/25/2021 2137   HGB 15.0 10/25/2021 2151   HGB 13.3 09/27/2020 1504   HCT 44.0 10/25/2021 2151   HCT 39.9 09/27/2020 1504   PLT 336 10/25/2021 2137   PLT 336 09/27/2020 1504   MCV 81.4 10/25/2021 2137    MCV 81 09/27/2020 1504   MCH 25.4 (L) 10/25/2021 2137   MCHC 31.3 10/25/2021 2137   RDW 15.2 10/25/2021 2137   RDW 14.2 09/27/2020 1504   LYMPHSABS 5.6 (H) 10/25/2021 2137   LYMPHSABS 4.7 (H) 09/27/2020 1504   MONOABS 1.0 10/25/2021 2137   EOSABS 0.3 10/25/2021 2137   EOSABS 0.3 09/27/2020 1504   BASOSABS 0.1 10/25/2021 2137   BASOSABS 0.1 09/27/2020 1504    BMET    Component Value Date/Time   NA 141 10/25/2021 2151   NA 140 04/21/2019 1527   K 3.9 10/25/2021 2151   CL 106 10/25/2021 2151   CO2 24 10/25/2021 2137   GLUCOSE 98 10/25/2021 2151   BUN 10 10/25/2021 2151   BUN 10 04/21/2019 1527   CREATININE 0.80 10/25/2021 2151   CALCIUM 8.4 (L) 10/25/2021 2137   GFRNONAA >60 10/25/2021 2137   GFRAA 96 04/21/2019 1527    BNP No results found for: "BNP"  ProBNP No results found for: "PROBNP"  Imaging: No results found.   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 Encounter for screening breast examination  Breast exam normal  3. Heavy menstrual cycle  Abdominal pain  Referral placed to GYN  Follow up:  Follow up in 6 months or sooner if needed   Patient Instructions  1. 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 Encounter for screening breast examination  Breast exam normal  3. Heavy menstrual cycle  Abdominal pain  Referral placed to GYN  Follow up:  Follow up in 6 months or sooner if needed    Health Maintenance, Female Adopting a healthy lifestyle and getting preventive care are important in promoting health and wellness. Ask your health care provider about: The right schedule for you to have regular tests and exams. Things you can do on your own to prevent diseases and keep yourself healthy. What should I know about diet, weight, and exercise? Eat a healthy diet  Eat a diet that includes  plenty of vegetables, fruits, low-fat dairy products, and lean protein. Do not eat a lot of foods that are high in solid fats, added sugars, or sodium. Maintain a healthy weight Body mass index (BMI) is used to identify weight problems. It estimates body fat based on height and weight. Your health care provider can help determine your BMI and help you achieve or maintain a healthy weight. Get regular exercise Get  regular exercise. This is one of the most important things you can do for your health. Most adults should: Exercise for at least 150 minutes each week. The exercise should increase your heart rate and make you sweat (moderate-intensity exercise). Do strengthening exercises at least twice a week. This is in addition to the moderate-intensity exercise. Spend less time sitting. Even light physical activity can be beneficial. Watch cholesterol and blood lipids Have your blood tested for lipids and cholesterol at 37 years of age, then have this test every 5 years. Have your cholesterol levels checked more often if: Your lipid or cholesterol levels are high. You are older than 37 years of age. You are at high risk for heart disease. What should I know about cancer screening? Depending on your health history and family history, you may need to have cancer screening at various ages. This may include screening for: Breast cancer. Cervical cancer. Colorectal cancer. Skin cancer. Lung cancer. What should I know about heart disease, diabetes, and high blood pressure? Blood pressure and heart disease High blood pressure causes heart disease and increases the risk of stroke. This is more likely to develop in people who have high blood pressure readings or are overweight. Have your blood pressure checked: Every 3-5 years if you are 51-78 years of age. Every year if you are 46 years old or older. Diabetes Have regular diabetes screenings. This checks your fasting blood sugar level. Have the  screening done: Once every three years after age 84 if you are at a normal weight and have a low risk for diabetes. More often and at a younger age if you are overweight or have a high risk for diabetes. What should I know about preventing infection? Hepatitis B If you have a higher risk for hepatitis B, you should be screened for this virus. Talk with your health care provider to find out if you are at risk for hepatitis B infection. Hepatitis C Testing is recommended for: Everyone born from [birth year] through 1965. Anyone with known risk factors for hepatitis C. Sexually transmitted infections (STIs) Get screened for STIs, including gonorrhea and chlamydia, if: You are sexually active and are younger than 37 years of age. You are older than 37 years of age and your health care provider tells you that you are at risk for this type of infection. Your sexual activity has changed since you were last screened, and you are at increased risk for chlamydia or gonorrhea. Ask your health care provider if you are at risk. Ask your health care provider about whether you are at high risk for HIV. Your health care provider may recommend a prescription medicine to help prevent HIV infection. If you choose to take medicine to prevent HIV, you should first get tested for HIV. You should then be tested every 3 months for as long as you are taking the medicine. Pregnancy If you are about to stop having your period (premenopausal) and you may become pregnant, seek counseling before you get pregnant. Take 400 to 800 micrograms (mcg) of folic acid every day if you become pregnant. Ask for birth control (contraception) if you want to prevent pregnancy. Osteoporosis and menopause Osteoporosis is a disease in which the bones lose minerals and strength with aging. This can result in bone fractures. If you are 10 years old or older, or if you are at risk for osteoporosis and fractures, ask your health care provider if you  should: Be screened for bone loss. Take a calcium  or vitamin D supplement to lower your risk of fractures. Be given hormone replacement therapy (HRT) to treat symptoms of menopause. Follow these instructions at home: Alcohol use Do not drink alcohol if: Your health care provider tells you not to drink. You are pregnant, may be pregnant, or are planning to become pregnant. If you drink alcohol: Limit how much you have to: 0-1 drink a day. Know how much alcohol is in your drink. In the [place] one drink equals one 12 oz bottle of beer (355 mL), one 5 oz glass of wine (148 mL), or one 1 oz glass of hard liquor (44 mL). Lifestyle Do not use any products that contain nicotine or tobacco. These products include cigarettes, chewing tobacco, and vaping devices, such as e-cigarettes. If you need help quitting, ask your health care provider. Do not use street drugs. Do not share needles. Ask your health care provider for help if you need support or information about quitting drugs. General instructions Schedule regular health, dental, and eye exams. Stay current with your vaccines. Tell your health care provider if: You often feel depressed. You have ever been abused or do not feel safe at home. Summary Adopting a healthy lifestyle and getting preventive care are important in promoting health and wellness. Follow your health care provider's instructions about healthy diet, exercising, and getting tested or screened for diseases. Follow your health care provider's instructions on monitoring your cholesterol and blood pressure. This information is not intended to replace advice given to you by your health care provider. Make sure you discuss any questions you have with your health care provider. Document Revised: 03/07/2021 Document Reviewed: 03/07/2021 Elsevier Patient Education  [place], [place] 07/26/2022

## 2022-02-22 ENCOUNTER — Other Ambulatory Visit: Payer: Self-pay | Admitting: Nurse Practitioner

## 2022-02-22 ENCOUNTER — Other Ambulatory Visit (HOSPITAL_COMMUNITY): Payer: Self-pay

## 2022-02-22 DIAGNOSIS — N760 Acute vaginitis: Secondary | ICD-10-CM

## 2022-02-22 LAB — NUSWAB VAGINITIS PLUS (VG+)
Atopobium vaginae: HIGH Score — AB
BVAB 2: HIGH Score — AB
Candida albicans, NAA: NEGATIVE
Candida glabrata, NAA: NEGATIVE
Chlamydia trachomatis, NAA: POSITIVE — AB
Megasphaera 1: HIGH Score — AB
Neisseria gonorrhoeae, NAA: NEGATIVE
Trich vag by NAA: NEGATIVE

## 2022-02-22 MED ORDER — METRONIDAZOLE 500 MG PO TABS
500.0000 mg | ORAL_TABLET | Freq: Two times a day (BID) | ORAL | 0 refills | Status: DC
  Filled 2022-02-22: qty 14, 7d supply, fill #0

## 2022-02-27 ENCOUNTER — Other Ambulatory Visit: Payer: Self-pay | Admitting: Nurse Practitioner

## 2022-02-27 ENCOUNTER — Other Ambulatory Visit (HOSPITAL_COMMUNITY): Payer: Self-pay

## 2022-02-27 DIAGNOSIS — A749 Chlamydial infection, unspecified: Secondary | ICD-10-CM

## 2022-02-27 LAB — PAP IG, CT-NG TV HSV 1/2 NAA
Chlamydia, Nuc. Acid Amp: POSITIVE — AB
Gonococcus, Nuc. Acid Amp: NEGATIVE
HSV 1 NAA: NEGATIVE
HSV 2 NAA: NEGATIVE
Trich vag by NAA: NEGATIVE

## 2022-02-27 MED ORDER — DOXYCYCLINE HYCLATE 100 MG PO CAPS
100.0000 mg | ORAL_CAPSULE | Freq: Two times a day (BID) | ORAL | 0 refills | Status: AC
  Filled 2022-02-27: qty 14, 7d supply, fill #0

## 2022-03-01 ENCOUNTER — Other Ambulatory Visit: Payer: Self-pay

## 2022-03-01 ENCOUNTER — Other Ambulatory Visit (HOSPITAL_COMMUNITY): Payer: Self-pay

## 2022-03-01 DIAGNOSIS — I10 Essential (primary) hypertension: Secondary | ICD-10-CM

## 2022-03-01 MED ORDER — AMLODIPINE BESYLATE 5 MG PO TABS
5.0000 mg | ORAL_TABLET | Freq: Every day | ORAL | 0 refills | Status: DC
  Filled 2022-03-01 – 2022-03-23 (×2): qty 10, 10d supply, fill #0

## 2022-03-09 ENCOUNTER — Other Ambulatory Visit (HOSPITAL_COMMUNITY): Payer: Self-pay

## 2022-03-23 ENCOUNTER — Other Ambulatory Visit (HOSPITAL_COMMUNITY): Payer: Self-pay

## 2022-03-31 ENCOUNTER — Telehealth: Payer: Self-pay | Admitting: Nurse Practitioner

## 2022-03-31 ENCOUNTER — Other Ambulatory Visit (HOSPITAL_COMMUNITY): Payer: Self-pay

## 2022-03-31 ENCOUNTER — Other Ambulatory Visit: Payer: Self-pay | Admitting: Nurse Practitioner

## 2022-03-31 DIAGNOSIS — I10 Essential (primary) hypertension: Secondary | ICD-10-CM

## 2022-03-31 MED ORDER — AMLODIPINE BESYLATE 5 MG PO TABS
5.0000 mg | ORAL_TABLET | Freq: Every day | ORAL | 0 refills | Status: DC
  Filled 2022-03-31: qty 90, 90d supply, fill #0

## 2022-03-31 NOTE — Telephone Encounter (Signed)
Amlodipine refill request Pt also requesting a prescription that lasts longer than 10 days be sent in.

## 2022-03-31 NOTE — Telephone Encounter (Signed)
90 day supply sent to pharmacy

## 2022-07-17 ENCOUNTER — Telehealth: Payer: Self-pay

## 2022-07-17 NOTE — Telephone Encounter (Signed)
Amlodipine.

## 2022-07-18 ENCOUNTER — Other Ambulatory Visit: Payer: Self-pay | Admitting: Nurse Practitioner

## 2022-07-18 ENCOUNTER — Other Ambulatory Visit (HOSPITAL_COMMUNITY): Payer: Self-pay

## 2022-07-18 DIAGNOSIS — I10 Essential (primary) hypertension: Secondary | ICD-10-CM

## 2022-07-18 MED ORDER — AMLODIPINE BESYLATE 5 MG PO TABS
5.0000 mg | ORAL_TABLET | Freq: Every day | ORAL | 0 refills | Status: DC
  Filled 2022-07-18: qty 90, 90d supply, fill #0

## 2022-07-26 ENCOUNTER — Encounter: Payer: Self-pay | Admitting: Nurse Practitioner

## 2022-07-26 NOTE — Assessment & Plan Note (Signed)
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 Encounter for screening breast examination  Breast exam normal  3. Heavy menstrual cycle  Abdominal pain  Referral placed to GYN  Follow up:  Follow up in 6 months or sooner if needed

## 2022-08-09 ENCOUNTER — Encounter: Payer: Self-pay | Admitting: Nurse Practitioner

## 2022-08-09 ENCOUNTER — Ambulatory Visit (INDEPENDENT_AMBULATORY_CARE_PROVIDER_SITE_OTHER): Payer: Commercial Managed Care - HMO | Admitting: Nurse Practitioner

## 2022-08-09 VITALS — BP 112/79 | HR 94 | Temp 98.4°F | Wt 291.0 lb

## 2022-08-09 DIAGNOSIS — L709 Acne, unspecified: Secondary | ICD-10-CM | POA: Diagnosis not present

## 2022-08-09 DIAGNOSIS — R8290 Unspecified abnormal findings in urine: Secondary | ICD-10-CM | POA: Diagnosis not present

## 2022-08-09 DIAGNOSIS — M25561 Pain in right knee: Secondary | ICD-10-CM | POA: Diagnosis not present

## 2022-08-09 DIAGNOSIS — Z0000 Encounter for general adult medical examination without abnormal findings: Secondary | ICD-10-CM | POA: Diagnosis not present

## 2022-08-09 DIAGNOSIS — M25562 Pain in left knee: Secondary | ICD-10-CM

## 2022-08-09 DIAGNOSIS — G8929 Other chronic pain: Secondary | ICD-10-CM

## 2022-08-09 LAB — POCT URINALYSIS DIPSTICK
Bilirubin, UA: NEGATIVE
Glucose, UA: NEGATIVE
Ketones, UA: NEGATIVE
Leukocytes, UA: NEGATIVE
Nitrite, UA: NEGATIVE
Protein, UA: POSITIVE — AB
Spec Grav, UA: >=1.03 — AB (ref 1.010–1.025)
Urobilinogen, UA: 0.2 E.U./dL
pH, UA: 5.5 (ref 5.0–8.0)

## 2022-08-09 NOTE — Assessment & Plan Note (Signed)
-   Ambulatory referral to Dermatology  2. Bilateral chronic knee pain  - AMB referral to orthopedics  3. Routine adult health maintenance  - CBC - Comprehensive metabolic panel - Lipid Panel - TSH   Follow up:  Follow up in 6 months or sooner if needed

## 2022-08-09 NOTE — Patient Instructions (Addendum)
1. Acne, unspecified acne type  - Ambulatory referral to Dermatology  2. Bilateral chronic knee pain  - AMB referral to orthopedics  3. Routine adult health maintenance  - CBC - Comprehensive metabolic panel - Lipid Panel - TSH   Follow up:  Follow up in 6 months or sooner if needed

## 2022-08-09 NOTE — Progress Notes (Signed)
_0  ID: Ariana Rogers, female    DOB: 28-Jul-1986, 36 y.o.   MRN: [id number]  Chief Complaint  Patient presents with   Follow-up    Referral to derm for bumps. Referral to ortho for swelling in knees. Vaginal odor. Needs albuterol sent in for allergies.     Referring provider: Fenton Foy, NP   HPI  [facility] presents for follow up. She  has a past medical history of Acute bronchitis, Cough (03/2019), GERD (gastroesophageal reflux disease) (03/2019), Left breast mass, Smoker, and Vitamin D deficiency (09/2020).   Patient presents today for a follow-up visit.  Is compliant with meds.  Patient complains today of chronic bilateral knee pain.  She has been seen for this in the past and was referred to Ortho but did not follow-up with them.  We will place another referral today.  Patient also is requesting a referral to OB/GYN.  We discussed that this referral has already been placed.  We will discuss this with the referral coordinator.  Patient does have vaginal irritation and odor today.  We will defer vaginal swab today because patient did start her menstrual cycle today.  We will check urine sample. Denies f/c/s, n/v/d, hemoptysis, PND, leg swelling Denies chest pain or edema   No Known Allergies  Immunization History  Administered Date(s) Administered   Influenza,inj,Quad PF,6+ Mos 01/15/2018, 09/27/2020, 08/17/2021   PFIZER(Purple Top)SARS-COV-2 Vaccination 06/07/2020, 07/01/2020   PPD Test 03/26/2018   Pneumococcal Polysaccharide-23 08/17/2021   Tdap 01/15/2018    Past Medical History:  Diagnosis Date   Acute bronchitis    Cough 03/2019   GERD (gastroesophageal reflux disease) 03/2019   Left breast mass    Smoker    Vitamin D deficiency 09/2020    Tobacco History: Social History   Tobacco Use  Smoking Status Every Day   Packs/day: 0.50   Types: Cigarettes, Cigars   Last attempt to quit: 03/30/2021   Years since quitting: 1.3  Smokeless Tobacco Never   Tobacco Comments   Black and mild's     Ready to quit: Not Answered Counseling given: Not Answered Tobacco comments: Black and mild's     Outpatient Encounter Medications as of 08/09/2022  Medication Sig   amLODipine (NORVASC) 5 MG tablet Take 1 tablet (5 mg total) by mouth daily.   Blood Glucose Monitoring Suppl (BLOOD GLUCOSE MONITOR SYSTEM) w/Device KIT Use up to 4 (four) times daily as directed.   cetirizine (ZYRTEC) 10 MG chewable tablet Chew 10 mg by mouth daily.   fluticasone (FLONASE) 50 MCG/ACT nasal spray Place 2 sprays into both nostrils daily.   glucose blood test strip Use up to 4 (four) times daily as directed.   Lancets (FREESTYLE) lancets Use up to 4 (four) times daily as directed.   metroNIDAZOLE (FLAGYL) 500 MG tablet Take 1 tablet (500 mg total) by mouth 2 (two) times daily for 7 days   No facility-administered encounter medications on file as of 08/09/2022.     Review of Systems  Review of Systems  Constitutional: Negative.   HENT: Negative.    Cardiovascular: Negative.   Gastrointestinal: Negative.   Allergic/Immunologic: Negative.   Neurological: Negative.   Psychiatric/Behavioral: Negative.         Physical Exam  BP 112/79   Pulse 94   Temp 98.4 F (36.9 C) (Temporal)   Wt 291 lb (132 kg)   LMP 08/09/2022 (Exact Date) Comment: 1st day of cycle.  SpO2 100%   BMI 48.42 kg/m  Wt Readings from Last 5 Encounters:  08/09/22 291 lb (132 kg)  02/17/22 291 lb 6.4 oz (132.2 kg)  11/17/21 288 lb 9.6 oz (130.9 kg)  10/25/21 (!) 308 lb 10.3 oz (140 kg)  08/17/21 296 lb 12.8 oz (134.6 kg)     Physical Exam Vitals and nursing note reviewed.  Constitutional:      General: She is not in acute distress.    Appearance: She is well-developed.  Cardiovascular:     Rate and Rhythm: Normal rate and regular rhythm.  Pulmonary:     Effort: Pulmonary effort is normal.     Breath sounds: Normal breath sounds.  Neurological:     Mental Status: She is  alert and oriented to person, place, and time.      Lab Results:  CBC    Component Value Date/Time   WBC 15.8 (H) 10/25/2021 2137   RBC 5.15 (H) 10/25/2021 2137   HGB 15.0 10/25/2021 2151   HGB 13.3 09/27/2020 1504   HCT 44.0 10/25/2021 2151   HCT 39.9 09/27/2020 1504   PLT 336 10/25/2021 2137   PLT 336 09/27/2020 1504   MCV 81.4 10/25/2021 2137   MCV 81 09/27/2020 1504   MCH 25.4 (L) 10/25/2021 2137   MCHC 31.3 10/25/2021 2137   RDW 15.2 10/25/2021 2137   RDW 14.2 09/27/2020 1504   LYMPHSABS 5.6 (H) 10/25/2021 2137   LYMPHSABS 4.7 (H) 09/27/2020 1504   MONOABS 1.0 10/25/2021 2137   EOSABS 0.3 10/25/2021 2137   EOSABS 0.3 09/27/2020 1504   BASOSABS 0.1 10/25/2021 2137   BASOSABS 0.1 09/27/2020 1504    BMET    Component Value Date/Time   NA 141 10/25/2021 2151   NA 140 04/21/2019 1527   K 3.9 10/25/2021 2151   CL 106 10/25/2021 2151   CO2 24 10/25/2021 2137   GLUCOSE 98 10/25/2021 2151   BUN 10 10/25/2021 2151   BUN 10 04/21/2019 1527   CREATININE 0.80 10/25/2021 2151   CALCIUM 8.4 (L) 10/25/2021 2137   GFRNONAA >60 10/25/2021 2137   GFRAA 96 04/21/2019 1527     Assessment & Plan:   Acne - Ambulatory referral to Dermatology  2. Bilateral chronic knee pain  - AMB referral to orthopedics  3. Routine adult health maintenance  - CBC - Comprehensive metabolic panel - Lipid Panel - TSH   Follow up:  Follow up in 6 months or sooner if needed     Fenton Foy, NP 08/09/2022

## 2022-08-10 LAB — COMPREHENSIVE METABOLIC PANEL
ALT: 17 IU/L (ref 0–32)
AST: 16 IU/L (ref 0–40)
Albumin/Globulin Ratio: 1.8 (ref 1.2–2.2)
Albumin: 4.2 g/dL (ref 3.9–4.9)
Alkaline Phosphatase: 75 IU/L (ref 44–121)
BUN/Creatinine Ratio: 13 (ref 9–23)
BUN: 11 mg/dL (ref 6–20)
Bilirubin Total: 0.6 mg/dL (ref 0.0–1.2)
CO2: 22 mmol/L (ref 20–29)
Calcium: 9 mg/dL (ref 8.7–10.2)
Chloride: 105 mmol/L (ref 96–106)
Creatinine, Ser: 0.83 mg/dL (ref 0.57–1.00)
Globulin, Total: 2.4 g/dL (ref 1.5–4.5)
Glucose: 123 mg/dL — ABNORMAL HIGH (ref 70–99)
Potassium: 3.8 mmol/L (ref 3.5–5.2)
Sodium: 140 mmol/L (ref 134–144)
Total Protein: 6.6 g/dL (ref 6.0–8.5)
eGFR: 94 mL/min/{1.73_m2} (ref >59)

## 2022-08-10 LAB — CBC
Hematocrit: 39.3 % (ref 34.0–46.6)
Hemoglobin: 13.2 g/dL (ref 11.1–15.9)
MCH: 26.2 pg — ABNORMAL LOW (ref 26.6–33.0)
MCHC: 33.6 g/dL (ref 31.5–35.7)
MCV: 78 fL — ABNORMAL LOW (ref 79–97)
Platelets: 342 10*3/uL (ref 150–450)
RBC: 5.04 x10E6/uL (ref 3.77–5.28)
RDW: 15 % (ref 11.7–15.4)
WBC: 14.7 10*3/uL — ABNORMAL HIGH (ref 3.4–10.8)

## 2022-08-10 LAB — LIPID PANEL
Chol/HDL Ratio: 4.5 ratio — ABNORMAL HIGH (ref 0.0–4.4)
Cholesterol, Total: 179 mg/dL (ref 100–199)
HDL: 40 mg/dL (ref >39)
LDL Chol Calc (NIH): 111 mg/dL — ABNORMAL HIGH (ref 0–99)
Triglycerides: 160 mg/dL — ABNORMAL HIGH (ref 0–149)
VLDL Cholesterol Cal: 28 mg/dL (ref 5–40)

## 2022-08-10 LAB — TSH: TSH: 0.843 u[IU]/mL (ref 0.450–4.500)

## 2022-08-11 LAB — URINE CULTURE

## 2022-08-16 ENCOUNTER — Ambulatory Visit (INDEPENDENT_AMBULATORY_CARE_PROVIDER_SITE_OTHER): Payer: Commercial Managed Care - HMO

## 2022-08-16 ENCOUNTER — Ambulatory Visit: Payer: Self-pay

## 2022-08-16 ENCOUNTER — Ambulatory Visit (INDEPENDENT_AMBULATORY_CARE_PROVIDER_SITE_OTHER): Payer: Commercial Managed Care - HMO | Admitting: Orthopaedic Surgery

## 2022-08-16 ENCOUNTER — Encounter: Payer: Self-pay | Admitting: Orthopaedic Surgery

## 2022-08-16 DIAGNOSIS — M25561 Pain in right knee: Secondary | ICD-10-CM | POA: Diagnosis not present

## 2022-08-16 DIAGNOSIS — Z6841 Body Mass Index (BMI) 40.0 and over, adult: Secondary | ICD-10-CM | POA: Diagnosis not present

## 2022-08-16 DIAGNOSIS — M25562 Pain in left knee: Secondary | ICD-10-CM

## 2022-08-16 DIAGNOSIS — G8929 Other chronic pain: Secondary | ICD-10-CM | POA: Diagnosis not present

## 2022-08-16 MED ORDER — BUPIVACAINE HCL 0.5 % IJ SOLN
2.0000 mL | INTRAMUSCULAR | Status: AC | PRN
  Administered 2022-08-16: 2 mL via INTRA_ARTICULAR

## 2022-08-16 MED ORDER — METHYLPREDNISOLONE ACETATE 40 MG/ML IJ SUSP
40.0000 mg | INTRAMUSCULAR | Status: AC | PRN
  Administered 2022-08-16: 40 mg via INTRA_ARTICULAR

## 2022-08-16 MED ORDER — LIDOCAINE HCL 1 % IJ SOLN
2.0000 mL | INTRAMUSCULAR | Status: AC | PRN
  Administered 2022-08-16: 2 mL

## 2022-08-16 NOTE — Progress Notes (Signed)
Office Visit Note   Patient: Ariana Rogers           Date of Birth: 02-22-1986           MRN: [id number] Visit Date: 08/16/2022              Requested by: Ivonne Andrew, NP [id number] [address]. [address], [address] [place],  [place] [address] PCP: Ivonne Andrew, NP   Assessment & Plan: Visit Diagnoses:  1. Chronic pain of both knees     Plan: Impression is chronic bilateral knee pain.  Her clinical exam is relatively unremarkable.  We have discussed proceeding with diagnostic and hopefully therapeutic bilateral knee cortisone injections for which she is agreeable to.  She will let us know if her symptoms do not improve and we may look further into her back and hips.  Call with concerns or questions in the meantime.  Follow-Up Instructions: Return if symptoms worsen or fail to improve.   Orders:  Orders Placed This Encounter  Procedures   XR KNEE 3 VIEW LEFT   XR KNEE 3 VIEW RIGHT   No orders of the defined types were placed in this encounter.     Procedures: Large Joint Inj: bilateral knee on 08/16/2022 7:46 PM Indications: pain Details: 22 G needle  Arthrogram: No  Medications (Right): 2 mL lidocaine 1 %; 2 mL bupivacaine 0.5 %; 40 mg methylPREDNISolone acetate 40 MG/ML Medications (Left): 2 mL lidocaine 1 %; 2 mL bupivacaine 0.5 %; 40 mg methylPREDNISolone acetate 40 MG/ML Outcome: tolerated well, no immediate complications Patient was prepped and draped in the usual sterile fashion.       Clinical Data: No additional findings.   Subjective: Chief Complaint  Patient presents with   Left Knee - Pain   Right Knee - Pain    HPI patient is a pleasant 36 year old female who comes in today with bilateral knee pain both equally as bad.  The pain in the left knee started back in 2015 when she feels as though she fell and her knee shifted.  The right knee pain began in 2021 when she again fell and felt like her knee shifted.  She has had intermittent pain to the anterior knee  since.  She has associated swelling.  She denies any instability.  Occasional mechanical symptoms.  She denies any specific aggravators.  She has been taking ibuprofen which helps.  No previous cortisone injection to either knee.  Review of Systems as detailed in HPI.  All others reviewed and are negative.   Objective: Vital Signs: LMP 08/09/2022 (Exact Date) Comment: 1st day of cycle.  Physical Exam well-developed well-nourished female in no acute distress.  Alert and oriented x3.  Ortho Exam bilateral knee exam shows trace effusion.  Range of motion 0 to 125 degrees.  No joint line tenderness palpation.  No patellar apprehension or tenderness along the MPFL.  She is stable valgus varus stress.  Negative anterior drawer.  She is neurovascular intact distally.  Negative logroll negative FADIR.  Specialty Comments:  No specialty comments available.  Imaging: XR KNEE 3 VIEW LEFT  Result Date: 08/16/2022 No acute or structural abnormalities  XR KNEE 3 VIEW RIGHT  Result Date: 08/16/2022 No acute or structural abnormalities    PMFS History: Patient Active Problem List   Diagnosis Date Noted   Acne 08/09/2022   Visit for gynecologic examination 02/17/2022   Encounter for screening breast examination 02/17/2022   Gastroesophageal reflux disease without esophagitis 04/21/2019  Cough 04/21/2019   Acute bronchitis    Breast mass, left 01/28/2019   Painful lumpy left breast 01/28/2019   Maculopapular rash, generalized 12/30/2018   Pityriasis rosea 12/30/2018   Class 3 severe obesity due to excess calories without serious comorbidity with body mass index (BMI) of 40.0 to 44.9 in adult [facility]) 12/30/2018   Itching 12/30/2018   Leukocytosis 01/17/2018   Past Medical History:  Diagnosis Date   Acute bronchitis    Cough 03/2019   GERD (gastroesophageal reflux disease) 03/2019   Left breast mass    Smoker    Vitamin D deficiency 09/2020    Family History  Problem Relation Age of  Onset   Hypertension Mother    Arthritis Mother    Fibromyalgia Mother    Other Father        no health concern    Past Surgical History:  Procedure Laterality Date   DENTAL SURGERY     TUBAL LIGATION  2 years ago   Social History   Occupational History   Not on file  Tobacco Use   Smoking status: Every Day    Packs/day: 0.50    Types: Cigarettes, Cigars    Last attempt to quit: 03/30/2021    Years since quitting: 1.3   Smokeless tobacco: Never   Tobacco comments:    Black and mild's    Vaping Use   Vaping Use: Never used  Substance and Sexual Activity   Alcohol use: Yes    Comment: occasionally   Drug use: Yes    Types: Marijuana    Comment: edibles   Sexual activity: Yes    Birth control/protection: Surgical

## 2022-08-28 ENCOUNTER — Other Ambulatory Visit: Payer: Self-pay | Admitting: Nurse Practitioner

## 2022-08-29 ENCOUNTER — Other Ambulatory Visit (HOSPITAL_COMMUNITY): Payer: Self-pay

## 2022-08-30 ENCOUNTER — Other Ambulatory Visit (HOSPITAL_COMMUNITY): Payer: Self-pay

## 2022-08-30 MED ORDER — FLUTICASONE PROPIONATE 50 MCG/ACT NA SUSP
2.0000 | Freq: Every day | NASAL | 6 refills | Status: DC
  Filled 2022-08-30 – 2022-10-26 (×2): qty 16, 30d supply, fill #0

## 2022-09-08 ENCOUNTER — Other Ambulatory Visit (HOSPITAL_COMMUNITY): Payer: Self-pay

## 2022-10-26 ENCOUNTER — Other Ambulatory Visit (HOSPITAL_COMMUNITY): Payer: Self-pay

## 2022-10-26 ENCOUNTER — Other Ambulatory Visit: Payer: Self-pay | Admitting: Nurse Practitioner

## 2022-10-26 DIAGNOSIS — I10 Essential (primary) hypertension: Secondary | ICD-10-CM

## 2022-10-31 ENCOUNTER — Other Ambulatory Visit (HOSPITAL_COMMUNITY): Payer: Self-pay

## 2022-10-31 ENCOUNTER — Other Ambulatory Visit: Payer: Self-pay | Admitting: Nurse Practitioner

## 2022-10-31 DIAGNOSIS — I10 Essential (primary) hypertension: Secondary | ICD-10-CM

## 2022-11-01 ENCOUNTER — Other Ambulatory Visit: Payer: Self-pay | Admitting: *Deleted

## 2022-11-01 ENCOUNTER — Other Ambulatory Visit (HOSPITAL_COMMUNITY): Payer: Self-pay

## 2022-11-01 DIAGNOSIS — I10 Essential (primary) hypertension: Secondary | ICD-10-CM

## 2022-11-01 MED ORDER — AMLODIPINE BESYLATE 5 MG PO TABS
5.0000 mg | ORAL_TABLET | Freq: Every day | ORAL | 0 refills | Status: DC
  Filled 2022-11-01: qty 90, 90d supply, fill #0

## 2022-12-23 ENCOUNTER — Other Ambulatory Visit: Payer: Self-pay

## 2022-12-23 ENCOUNTER — Encounter (HOSPITAL_COMMUNITY): Payer: Self-pay

## 2022-12-23 ENCOUNTER — Emergency Department (HOSPITAL_COMMUNITY): Payer: Self-pay

## 2022-12-23 ENCOUNTER — Emergency Department (HOSPITAL_COMMUNITY)
Admission: EM | Admit: 2022-12-23 | Discharge: 2022-12-23 | Disposition: A | Discharge status: Home / Self Care | Payer: Self-pay | Attending: Emergency Medicine | Admitting: Emergency Medicine

## 2022-12-23 DIAGNOSIS — S8991XA Unspecified injury of right lower leg, initial encounter: Secondary | ICD-10-CM | POA: Insufficient documentation

## 2022-12-23 DIAGNOSIS — M25561 Pain in right knee: Secondary | ICD-10-CM

## 2022-12-23 DIAGNOSIS — X501XXA Overexertion from prolonged static or awkward postures, initial encounter: Secondary | ICD-10-CM | POA: Insufficient documentation

## 2022-12-23 MED ORDER — OXYCODONE-ACETAMINOPHEN 5-325 MG PO TABS: 1.0000 | ORAL_TABLET | ORAL | 0 refills | Status: DC | PRN

## 2022-12-23 MED ORDER — OXYCODONE-ACETAMINOPHEN 5-325 MG PO TABS
1.0000 | ORAL_TABLET | Freq: Once | ORAL | Status: AC
  Administered 2022-12-23: 1 via ORAL
  Filled 2022-12-23: qty 1

## 2022-12-23 NOTE — ED Provider Notes (Signed)
[name]   CSN: [id number] Arrival date & time: 12/23/22  1439     History  Chief Complaint  Patient presents with   Knee Pain    Ariana Rogers is a 37 y.o. female.  The history is provided by the patient and medical records. No language interpreter was used.  Knee Pain Location:  Knee Time since incident:  1 day Injury: yes   Mechanism of injury comment:  Twisting Knee location:  R knee Pain details:    Quality:  Sharp   Radiates to:  Does not radiate   Severity:  Severe   Onset quality:  Sudden Chronicity:  New Dislocation: no   Tetanus status:  Unknown Prior injury to area:  Yes Relieved by:  Nothing Worsened by:  Rotation, bearing weight, extension and flexion Ineffective treatments:  None tried Associated symptoms: decreased ROM (with pain) and swelling   Associated symptoms: no back pain, no fatigue, no fever, no itching, no muscle weakness, no neck pain, no numbness and no stiffness        Home Medications Prior to Admission medications   Medication Sig Start Date End Date Taking? Authorizing Provider  amLODipine (NORVASC) 5 MG tablet Take 1 tablet (5 mg total) by mouth daily. 11/01/22   Fenton Foy, NP  Blood Glucose Monitoring Suppl (BLOOD GLUCOSE MONITOR SYSTEM) w/Device KIT Use up to 4 (four) times daily as directed. 08/17/21   Vevelyn Francois, NP  cetirizine (ZYRTEC) 10 MG chewable tablet Chew 10 mg by mouth daily.    [provider]  fluticasone (FLONASE) 50 MCG/ACT nasal spray Place 2 sprays into both nostrils daily. 08/30/22   Fenton Foy, NP  glucose blood test strip Use up to 4 (four) times daily as directed. 08/17/21   Vevelyn Francois, NP  Lancets (FREESTYLE) lancets Use up to 4 (four) times daily as directed. 08/17/21   Vevelyn Francois, NP  metroNIDAZOLE (FLAGYL) 500 MG tablet Take 1 tablet (500 mg total) by mouth 2 (two) times daily for 7 days 02/22/22   Fenton Foy,  NP      Allergies    Patient has no known allergies.    Review of Systems   Review of Systems  Constitutional:  Negative for chills, fatigue and fever.  HENT:  Negative for congestion.   Respiratory:  Negative for cough, chest tightness, shortness of breath and wheezing.   Cardiovascular:  Negative for chest pain and palpitations.  Gastrointestinal:  Negative for constipation, diarrhea and nausea.  Genitourinary:  Negative for flank pain.  Musculoskeletal:  Positive for joint swelling. Negative for back pain, neck pain, neck stiffness and stiffness.  Skin:  Negative for itching, rash and wound.  Neurological:  Negative for weakness and light-headedness.  Psychiatric/Behavioral:  Negative for agitation.   All other systems reviewed and are negative.   Physical Exam Updated Vital Signs BP (!) 145/108 (BP Location: Left Arm)   Pulse 90   Temp 98.7 F (37.1 C) (Oral)   Resp 18   Ht '5\' 5"'$  (1.651 m)   Wt 132 kg   SpO2 93%   BMI 48.43 kg/m  Physical Exam Vitals and nursing note reviewed.  Constitutional:      General: She is not in acute distress.    Appearance: She is well-developed. She is not ill-appearing, toxic-appearing or diaphoretic.  HENT:     Head: Normocephalic and atraumatic.  Eyes:     Conjunctiva/sclera:  Conjunctivae normal.     Pupils: Pupils are equal, round, and reactive to light.  Cardiovascular:     Rate and Rhythm: Normal rate and regular rhythm.     Heart sounds: No murmur heard. Pulmonary:     Effort: Pulmonary effort is normal. No respiratory distress.     Breath sounds: Normal breath sounds. No wheezing, rhonchi or rales.  Chest:     Chest wall: No tenderness.  Abdominal:     Palpations: Abdomen is soft.     Tenderness: There is no abdominal tenderness. There is no guarding or rebound.  Musculoskeletal:        General: Swelling and tenderness present.     Cervical back: Neck supple.     Right knee: Swelling and effusion present. Tenderness  present.     Right lower leg: No edema.     Left lower leg: No edema.       Legs:     Comments: Tenderness to the medial aspect of the right knee.  Swelling.  No crepitance.  Limited range of motion due to pain.  Intact pulses, strength, sensation distally.  Skin:    General: Skin is warm and dry.     Capillary Refill: Capillary refill takes less than 2 seconds.     Findings: No erythema or rash.  Neurological:     General: No focal deficit present.     Mental Status: She is alert.     Sensory: No sensory deficit.     Motor: No weakness.  Psychiatric:        Mood and Affect: Mood normal.     ED Results / Procedures / Treatments   Labs (all labs ordered are listed, but only abnormal results are displayed) Labs Reviewed - No data to display  EKG None  Radiology DG Knee Complete 4 Views Right  Result Date: 12/23/2022 CLINICAL DATA:  Right knee pain. Medial pain and swelling. Turned to the right yesterday and felt pain in right knee. EXAM: RIGHT KNEE - COMPLETE 4+ VIEW COMPARISON:  Right knee radiographs 08/16/2022 FINDINGS: Normal bone mineralization. Mild patella alta is again seen. New small joint effusion. Joint spaces are preserved. No acute fracture is seen. No dislocation. IMPRESSION: 1. New small joint effusion. No acute fracture. 2. Mild patella alta. Electronically Signed   By: Yvonne Kendall M.D.   On: 12/23/2022 15:32    Procedures Procedures    Medications Ordered in ED Medications  oxyCODONE-acetaminophen (PERCOCET/ROXICET) 5-325 MG per tablet 1 tablet (1 tablet Oral Given 12/23/22 1538)    ED Course/ Medical Decision Making/ A&P                             Medical Decision Making Risk Prescription drug management.    Ariana Rogers is a 36 y.o. female with a past medical history significant for obesity and GERD who presents with right knee pain.  Cording to patient, she was walking last night around 10 PM when she turned to talk to someone else and felt a  sudden onset pain in her right knee that twisted.  She did not fall and there was no contact injury.  She reports years ago she injured this knee with a fall but never had a fracture or needed surgery.  She reports he was at her baseline before this onset of pain with no fevers, chills, congestion, cough, chest pain, shortness breath, nausea, vomiting, constipation, diarrhea, or urinary symptoms.  She reports the pain is severe especially stressed to bend it.  She reports that the swollen.  She denies numbness, tingling, or weakness distally and denies any hip pain or back pain.  Denies any skin changes otherwise with no rash or fevers.  No history of infected joints in the chart.  On exam, lungs clear and chest nontender.  Ab nontender.  Hips nontender.  Ankles nontender.  Intact pulses, strength, and sensation distally.  She had limited range of motion due to pain and tender on the medial aspect of the right knee.  Anterior posterior and lateral side of the knee was nontender.  It is swollen.  No rash seen.  Clinically I suspect a ligamentous or meniscus type injury to the medial right knee.  She will get x-rays to rule out acute bony injury but if this is reassuring, anticipate immobilization, crutches, pain medicine prescription, and orthopedics follow-up for likely suspected ligamentous tear from this noncontact twisting injury.  3:54 PM X-ray returned showing small right knee effusion but no bony injury.  As with previous plan, we will put in immobilization, crutches, and give prescription for pain medicine and close orthopedics follow-up.  Patient agrees with plan of care.  Patient other questions or concerns and will be discharged.         Final Clinical Impression(s) / ED Diagnoses Final diagnoses:  Injury of right knee, initial encounter  Acute pain of right knee    Rx / DC Orders ED Discharge Orders          Ordered    oxyCODONE-acetaminophen (PERCOCET/ROXICET) 5-325 MG  tablet  Every 4 hours PRN        12/23/22 1557            Clinical Impression: 1. Injury of right knee, initial encounter   2. Acute pain of right knee     Disposition: Discharge  Condition: Good  I have discussed the results, Dx and Tx plan with the pt(& family if present). He/she/they expressed understanding and agree(s) with the plan. Discharge instructions discussed at great length. Strict return precautions discussed and pt &/or family have verbalized understanding of the instructions. No further questions at time of discharge.    New Prescriptions   OXYCODONE-ACETAMINOPHEN (PERCOCET/ROXICET) 5-325 MG TABLET    Take 1 tablet by mouth every 4 (four) hours as needed for severe pain.    Follow Up: Phylliss Bob, MD [place] [id number] [phone]   with orthopedic surgery  [facility] Emergency Department at [facility] [address] [place]       Telesha Deguzman, Gwenyth Allegra, MD 12/23/22 1600

## 2022-12-23 NOTE — ED Triage Notes (Signed)
Pt states she was standing and when she turned her head and body to the right to look back she got a pain in her right knee yesterday. Pt has 2+ swelling of right knee. Pt has 2+ right pedal pulse, cap refill less than 3 sec, warm to touch, pt able to wiggle toes. Pt denies numbness and tingling.

## 2022-12-23 NOTE — ED Provider Triage Note (Signed)
Emergency Medicine Provider Triage Evaluation Note  Ariana Rogers , a 37 y.o. female  was evaluated in triage.  Pt complains of right knee pain that began last night.  Patient stated she was talking with a friend and turn and all of a sudden started experiencing pain on the medial aspect of her right knee.  Patient has tried a knee brace and elevating her knee however says the pain is worse today.  Patient does not know if she heard a pop.  Patient denied fevers, decreased sensation, overlying skin color changes  Review of Systems  Positive: See HPI Negative: See HPI  Physical Exam  BP (!) 145/108 (BP Location: Left Arm)   Pulse 90   Temp 98.7 F (37.1 C) (Oral)   Resp 18   Ht '5\' 5"'$  (1.651 m)   Wt 132 kg   SpO2 93%   BMI 48.43 kg/m  Gen:   Awake, no distress   Resp:  Normal effort  MSK:   Right knee appears edematous as compared to the left with slight warmth to the touch, no overlying skin color changes, 3 out of 5 right knee extension as compared to 5 out of 5 left knee, no step-off/crepitus/abnormalities palpated patient had pain with valgus stress test, negative anterior drawer, negative varus stress test, tender over medial aspect of knee Other:  2+ bilateral dorsalis pedis pulses with regular rate, sensation intact in both feet  Medical Decision Making  Medically screening exam initiated at 2:50 PM.  Appropriate orders placed.  Ariana Rogers was informed that the remainder of the evaluation will be completed by another provider, this initial triage assessment does not replace that evaluation, and the importance of remaining in the ED until their evaluation is complete.  Workup initiated, I suspect patient damaged her medial meniscus or MCL, patient stable at this time   Elvina Sidle 12/23/22 1453

## 2022-12-23 NOTE — Discharge Instructions (Signed)
Your history, exam, and evaluation today are concerning for a ligamentous or other soft tissue injury on the right knee.  The x-ray did not show acute bony fracture or injury however I am concerned about other structural injury to the knee.  Please use the knee immobilizer, crutches, pain medicine, and rest and follow-up with orthopedics.  If any symptoms change or worsen acutely, please return to the nearest emergency department.

## 2022-12-23 NOTE — ED Notes (Signed)
AVS reviewed with pt and significant other prior to discharge. Pt verbalizes understanding. Belongings with pt upon depart. Pt taken to POV by wheelchair. Significant other driving.

## 2022-12-23 NOTE — ED Notes (Signed)
Knee immobilizer applied to right knee at this time. Crutches provided.

## 2023-01-15 ENCOUNTER — Telehealth: Payer: Self-pay | Admitting: Nurse Practitioner

## 2023-01-15 NOTE — Telephone Encounter (Signed)
Caller & Relationship to patient:  MRN #  [id number]  PATIENT STATES SHE IS HAVING SURGERY NEXT WEEK AND WILL NOT BE ABLE TO PICK UP HR MEDICATIONS FOR A LITTLE AFTER THAT SO SHE IS HOPING TO BE ABLE TO PICK THEM UP BEFORE SURGERY  Call Back Number:   Date of Last Office Visit: 11/01/2022     Date of Next Office Visit: 02/08/2023    Medication(s) to be Refilled: Amlodipine and inhaler   Preferred Pharmacy:   ** Please notify patient to allow 48-72 hours to process** **Let patient know to contact pharmacy at the end of the day to make sure medication is ready. ** **If patient has not been seen in a year or longer, book an appointment **Advise to use MyChart for refill requests OR to contact their pharmacy

## 2023-01-16 ENCOUNTER — Other Ambulatory Visit: Payer: Self-pay

## 2023-01-16 ENCOUNTER — Other Ambulatory Visit (HOSPITAL_COMMUNITY): Payer: Self-pay

## 2023-01-16 DIAGNOSIS — I10 Essential (primary) hypertension: Secondary | ICD-10-CM

## 2023-01-16 MED ORDER — AMLODIPINE BESYLATE 5 MG PO TABS
5.0000 mg | ORAL_TABLET | Freq: Every day | ORAL | 0 refills | Status: DC
  Filled 2023-01-16 – 2023-01-22 (×2): qty 90, 90d supply, fill #0

## 2023-01-16 NOTE — Telephone Encounter (Signed)
Done KH 

## 2023-01-22 ENCOUNTER — Encounter: Payer: Self-pay | Admitting: Nurse Practitioner

## 2023-01-22 ENCOUNTER — Other Ambulatory Visit (HOSPITAL_COMMUNITY): Payer: Self-pay

## 2023-01-22 ENCOUNTER — Ambulatory Visit (INDEPENDENT_AMBULATORY_CARE_PROVIDER_SITE_OTHER): Payer: Self-pay | Admitting: Nurse Practitioner

## 2023-01-22 VITALS — BP 115/63 | HR 82 | Temp 97.5°F | Wt 280.4 lb

## 2023-01-22 DIAGNOSIS — J40 Bronchitis, not specified as acute or chronic: Secondary | ICD-10-CM

## 2023-01-22 DIAGNOSIS — Z6841 Body Mass Index (BMI) 40.0 and over, adult: Secondary | ICD-10-CM

## 2023-01-22 MED ORDER — PREDNISONE 10 MG PO TABS
ORAL_TABLET | ORAL | 0 refills | Status: DC
  Filled 2023-01-22: qty 20, 8d supply, fill #0

## 2023-01-22 MED ORDER — AZITHROMYCIN 250 MG PO TABS
ORAL_TABLET | ORAL | 0 refills | Status: AC
  Filled 2023-01-22: qty 6, 5d supply, fill #0

## 2023-01-22 MED ORDER — PROMETHAZINE-DM 6.25-15 MG/5ML PO SYRP
2.5000 mL | ORAL_SOLUTION | Freq: Four times a day (QID) | ORAL | 0 refills | Status: DC | PRN
  Filled 2023-01-22: qty 118, 12d supply, fill #0

## 2023-01-22 NOTE — Assessment & Plan Note (Signed)
-   Microalbumin / creatinine urine ratio  2. Bronchitis  - azithromycin (ZITHROMAX) 250 MG tablet; Take 2 tablets on day 1, then 1 tablet daily on days 2 through 5  Dispense: 6 tablet; Refill: 0 - predniSONE (DELTASONE) 10 MG tablet; Take 4 tabs for 2 days, then 3 tabs for 2 days, then 2 tabs for 2 days, then 1 tab for 2 days, then stop  Dispense: 20 tablet; Refill: 0 - promethazine-dextromethorphan (PROMETHAZINE-DM) 6.25-15 MG/5ML syrup; Take 2.5 mLs by mouth 4 (four) times daily as needed for cough.  Dispense: 118 mL; Refill: 0   Follow up:  Follow up in 3 month

## 2023-01-22 NOTE — Progress Notes (Signed)
@Patient  ID: Ariana Rogers, female    DOB: 1986/05/25, 37 y.o.   MRN: [id number]  Chief Complaint  Patient presents with   Cough    For about two weeks     Referring provider: Fenton Foy, NP   HPI  Patient presents today for an acute visit.  She states that she has been having a cough for the past 2 weeks.  Patient does have a history of chronic bronchitis.  She states that this feels like a typical flare.  She is also having congestion and postnasal drip.  She denies any significant fever. Denies f/c/s, n/v/d, hemoptysis, PND, leg swelling Denies chest pain or edema     No Known Allergies  Immunization History  Administered Date(s) Administered   Influenza,inj,Quad PF,6+ Mos 01/15/2018, 09/27/2020, 08/17/2021   PFIZER(Purple Top)SARS-COV-2 Vaccination 06/07/2020, 07/01/2020   PPD Test 03/26/2018   Pneumococcal Polysaccharide-23 08/17/2021   Tdap 01/15/2018    Past Medical History:  Diagnosis Date   Acute bronchitis    Cough 03/2019   GERD (gastroesophageal reflux disease) 03/2019   Left breast mass    Smoker    Vitamin D deficiency 09/2020    Tobacco History: Social History   Tobacco Use  Smoking Status Former   Packs/day: .5   Types: Cigarettes, Cigars   Quit date: 03/30/2021   Years since quitting: 1.8  Smokeless Tobacco Never  Tobacco Comments   Black and mild's     Counseling given: Not Answered Tobacco comments: Black and mild's     Outpatient Encounter Medications as of 01/22/2023  Medication Sig   amLODipine (NORVASC) 5 MG tablet Take 1 tablet (5 mg total) by mouth daily.   azithromycin (ZITHROMAX) 250 MG tablet Take 2 tablets on day 1, then 1 tablet daily on days 2 through 5   Blood Glucose Monitoring Suppl (BLOOD GLUCOSE MONITOR SYSTEM) w/Device KIT Use up to 4 (four) times daily as directed.   cetirizine (ZYRTEC) 10 MG chewable tablet Chew 10 mg by mouth daily.   fluticasone (FLONASE) 50 MCG/ACT nasal spray Place 2 sprays into both  nostrils daily.   glucose blood test strip Use up to 4 (four) times daily as directed.   Lancets (FREESTYLE) lancets Use up to 4 (four) times daily as directed.   oxyCODONE-acetaminophen (PERCOCET/ROXICET) 5-325 MG tablet Take 1 tablet by mouth every 4 (four) hours as needed for severe pain.   predniSONE (DELTASONE) 10 MG tablet Take 4 tabs for 2 days, then 3 tabs for 2 days, then 2 tabs for 2 days, then 1 tab for 2 days, then stop   promethazine-dextromethorphan (PROMETHAZINE-DM) 6.25-15 MG/5ML syrup Take 2.5 mLs by mouth 4 (four) times daily as needed for cough.   metroNIDAZOLE (FLAGYL) 500 MG tablet Take 1 tablet (500 mg total) by mouth 2 (two) times daily for 7 days (Patient not taking: Reported on 01/22/2023)   No facility-administered encounter medications on file as of 01/22/2023.     Review of Systems  Review of Systems  Constitutional: Negative.   HENT:  Positive for congestion, postnasal drip, sinus pressure and sinus pain.   Respiratory:  Positive for cough.   Cardiovascular: Negative.   Gastrointestinal: Negative.   Allergic/Immunologic: Negative.   Neurological: Negative.   Psychiatric/Behavioral: Negative.         Physical Exam  BP 115/63   Pulse 82   Temp (!) 97.5 F (36.4 C)   Wt 280 lb 6.4 oz (127.2 kg)   SpO2 99%   BMI  46.66 kg/m   Wt Readings from Last 5 Encounters:  01/22/23 280 lb 6.4 oz (127.2 kg)  12/23/22 291 lb 0.1 oz (132 kg)  08/09/22 291 lb (132 kg)  02/17/22 291 lb 6.4 oz (132.2 kg)  11/17/21 288 lb 9.6 oz (130.9 kg)     Physical Exam Vitals and nursing note reviewed.  Constitutional:      General: She is not in acute distress.    Appearance: She is well-developed.  HENT:     Nose: Congestion present.  Cardiovascular:     Rate and Rhythm: Normal rate and regular rhythm.  Pulmonary:     Effort: Pulmonary effort is normal.     Breath sounds: Normal breath sounds.  Neurological:     Mental Status: She is alert and oriented to person,  place, and time.      Lab Results:  CBC    Component Value Date/Time   WBC 14.7 (H) 08/09/2022 1434   WBC 15.8 (H) 10/25/2021 2137   RBC 5.04 08/09/2022 1434   RBC 5.15 (H) 10/25/2021 2137   HGB 13.2 08/09/2022 1434   HCT 39.3 08/09/2022 1434   PLT 342 08/09/2022 1434   MCV 78 (L) 08/09/2022 1434   MCH 26.2 (L) 08/09/2022 1434   MCH 25.4 (L) 10/25/2021 2137   MCHC 33.6 08/09/2022 1434   MCHC 31.3 10/25/2021 2137   RDW 15.0 08/09/2022 1434   LYMPHSABS 5.6 (H) 10/25/2021 2137   LYMPHSABS 4.7 (H) 09/27/2020 1504   MONOABS 1.0 10/25/2021 2137   EOSABS 0.3 10/25/2021 2137   EOSABS 0.3 09/27/2020 1504   BASOSABS 0.1 10/25/2021 2137   BASOSABS 0.1 09/27/2020 1504    BMET    Component Value Date/Time   NA 140 08/09/2022 1434   K 3.8 08/09/2022 1434   CL 105 08/09/2022 1434   CO2 22 08/09/2022 1434   GLUCOSE 123 (H) 08/09/2022 1434   GLUCOSE 98 10/25/2021 2151   BUN 11 08/09/2022 1434   CREATININE 0.83 08/09/2022 1434   CALCIUM 9.0 08/09/2022 1434   GFRNONAA >60 10/25/2021 2137   GFRAA 96 04/21/2019 1527    BNP No results found for: "BNP"  ProBNP No results found for: "PROBNP"  Imaging: DG Knee Complete 4 Views Right  Result Date: 12/23/2022 CLINICAL DATA:  Right knee pain. Medial pain and swelling. Turned to the right yesterday and felt pain in right knee. EXAM: RIGHT KNEE - COMPLETE 4+ VIEW COMPARISON:  Right knee radiographs 08/16/2022 FINDINGS: Normal bone mineralization. Mild patella alta is again seen. New small joint effusion. Joint spaces are preserved. No acute fracture is seen. No dislocation. IMPRESSION: 1. New small joint effusion. No acute fracture. 2. Mild patella alta. Electronically Signed   By: Yvonne Kendall M.D.   On: 12/23/2022 15:32     Assessment & Plan:   Class 3 severe obesity due to excess calories without serious comorbidity with body mass index (BMI) of 40.0 to 44.9 in adult (HCC) - Microalbumin / creatinine urine ratio  2.  Bronchitis  - azithromycin (ZITHROMAX) 250 MG tablet; Take 2 tablets on day 1, then 1 tablet daily on days 2 through 5  Dispense: 6 tablet; Refill: 0 - predniSONE (DELTASONE) 10 MG tablet; Take 4 tabs for 2 days, then 3 tabs for 2 days, then 2 tabs for 2 days, then 1 tab for 2 days, then stop  Dispense: 20 tablet; Refill: 0 - promethazine-dextromethorphan (PROMETHAZINE-DM) 6.25-15 MG/5ML syrup; Take 2.5 mLs by mouth 4 (four) times daily as  needed for cough.  Dispense: 118 mL; Refill: 0   Follow up:  Follow up in 3 month     Fenton Foy, NP 01/22/2023

## 2023-01-22 NOTE — Patient Instructions (Addendum)
1. Class 3 severe obesity due to excess calories without serious comorbidity with body mass index (BMI) of 40.0 to 44.9 in adult (HCC)  - Microalbumin / creatinine urine ratio  2. Bronchitis  - azithromycin (ZITHROMAX) 250 MG tablet; Take 2 tablets on day 1, then 1 tablet daily on days 2 through 5  Dispense: 6 tablet; Refill: 0 - predniSONE (DELTASONE) 10 MG tablet; Take 4 tabs for 2 days, then 3 tabs for 2 days, then 2 tabs for 2 days, then 1 tab for 2 days, then stop  Dispense: 20 tablet; Refill: 0 - promethazine-dextromethorphan (PROMETHAZINE-DM) 6.25-15 MG/5ML syrup; Take 2.5 mLs by mouth 4 (four) times daily as needed for cough.  Dispense: 118 mL; Refill: 0   Follow up:  Follow up in 3 month

## 2023-01-23 ENCOUNTER — Ambulatory Visit: Payer: BLUE CROSS/BLUE SHIELD | Admitting: Obstetrics and Gynecology

## 2023-01-24 LAB — MICROALBUMIN / CREATININE URINE RATIO
Creatinine, Urine: 357.4 mg/dL
Microalb/Creat Ratio: 3 mg/g creat (ref 0–29)
Microalbumin, Urine: 12.4 ug/mL

## 2023-01-25 ENCOUNTER — Other Ambulatory Visit (HOSPITAL_COMMUNITY): Payer: Self-pay

## 2023-01-25 MED ORDER — OXYCODONE-ACETAMINOPHEN 5-325 MG PO TABS
1.0000 | ORAL_TABLET | ORAL | 0 refills | Status: DC
  Filled 2023-01-25: qty 42, 7d supply, fill #0

## 2023-01-25 MED ORDER — CELECOXIB 200 MG PO CAPS
200.0000 mg | ORAL_CAPSULE | Freq: Two times a day (BID) | ORAL | 0 refills | Status: DC
  Filled 2023-01-25: qty 60, 30d supply, fill #0

## 2023-02-08 ENCOUNTER — Ambulatory Visit: Payer: BLUE CROSS/BLUE SHIELD | Admitting: Nurse Practitioner

## 2023-04-30 ENCOUNTER — Ambulatory Visit (INDEPENDENT_AMBULATORY_CARE_PROVIDER_SITE_OTHER): Payer: BLUE CROSS/BLUE SHIELD | Admitting: Nurse Practitioner

## 2023-04-30 ENCOUNTER — Other Ambulatory Visit (HOSPITAL_COMMUNITY): Payer: Self-pay

## 2023-04-30 ENCOUNTER — Encounter: Payer: Self-pay | Admitting: Nurse Practitioner

## 2023-04-30 VITALS — BP 123/79 | HR 80 | Temp 97.3°F | Wt 287.0 lb

## 2023-04-30 DIAGNOSIS — R600 Localized edema: Secondary | ICD-10-CM

## 2023-04-30 DIAGNOSIS — Z131 Encounter for screening for diabetes mellitus: Secondary | ICD-10-CM

## 2023-04-30 DIAGNOSIS — Z113 Encounter for screening for infections with a predominantly sexual mode of transmission: Secondary | ICD-10-CM

## 2023-04-30 DIAGNOSIS — E559 Vitamin D deficiency, unspecified: Secondary | ICD-10-CM

## 2023-04-30 DIAGNOSIS — R0683 Snoring: Secondary | ICD-10-CM | POA: Diagnosis not present

## 2023-04-30 DIAGNOSIS — L509 Urticaria, unspecified: Secondary | ICD-10-CM

## 2023-04-30 DIAGNOSIS — Z6841 Body Mass Index (BMI) 40.0 and over, adult: Secondary | ICD-10-CM

## 2023-04-30 DIAGNOSIS — R351 Nocturia: Secondary | ICD-10-CM | POA: Diagnosis not present

## 2023-04-30 DIAGNOSIS — E538 Deficiency of other specified B group vitamins: Secondary | ICD-10-CM

## 2023-04-30 DIAGNOSIS — E119 Type 2 diabetes mellitus without complications: Secondary | ICD-10-CM

## 2023-04-30 LAB — POCT URINALYSIS DIP (PROADVANTAGE DEVICE)
Bilirubin, UA: NEGATIVE
Blood, UA: NEGATIVE
Glucose, UA: NEGATIVE mg/dL
Ketones, POC UA: NEGATIVE mg/dL
Leukocytes, UA: NEGATIVE
Nitrite, UA: NEGATIVE
Protein Ur, POC: NEGATIVE mg/dL
Specific Gravity, Urine: >=1.03
Urobilinogen, Ur: 0.2
pH, UA: 6 (ref 5.0–8.0)

## 2023-04-30 LAB — POCT GLYCOSYLATED HEMOGLOBIN (HGB A1C): Hemoglobin A1C: 6.5 % — AB (ref 4.0–5.6)

## 2023-04-30 MED ORDER — SEMAGLUTIDE(0.25 OR 0.5MG/DOS) 2 MG/3ML ~~LOC~~ SOPN
0.2500 mg | PEN_INJECTOR | SUBCUTANEOUS | 2 refills | Status: DC
  Filled 2023-04-30 – 2023-08-02 (×3): qty 3, 28d supply, fill #0
  Filled 2023-08-22: qty 3, 56d supply, fill #0
  Filled 2023-10-13: qty 3, 56d supply, fill #1
  Filled 2023-12-17: qty 3, 56d supply, fill #2

## 2023-04-30 MED ORDER — HYDROXYZINE HCL 10 MG PO TABS
10.0000 mg | ORAL_TABLET | Freq: Three times a day (TID) | ORAL | 0 refills | Status: AC | PRN
  Filled 2023-04-30 – 2023-05-31 (×2): qty 30, 10d supply, fill #0

## 2023-04-30 NOTE — Progress Notes (Signed)
@Patient  ID: Ariana Rogers, female    DOB: [birth year], 37 y.o.   MRN: [id number]  Chief Complaint  Patient presents with   Hypertension    Follow up    Referring provider: Ivonne Andrew, NP   HPI  [facility] presents for follow up. She  has a past medical history of Acute bronchitis, Cough (03/2019), GERD (gastroesophageal reflux disease) (03/2019), Left breast mass, Smoker, and Vitamin D deficiency (09/2020).    Patient presents today for a follow-up and multiple complaints.  She states that she has been having issues with fatigue throughout the day waking up frequently at night to urinate.  She states that she does snore and wakes herself up at times snoring.  We discussed that we will send her for a sleep evaluation.  UA in office today was negative.  Patient also complains of cramps and muscle spasms as well as episodes of trigger finger to both hands.  Will check labs today.  Patient would also like STD screening.       No Known Allergies  Immunization History  Administered Date(s) Administered   Influenza,inj,Quad PF,6+ Mos 01/15/2018, 09/27/2020, 08/17/2021   PFIZER(Purple Top)SARS-COV-2 Vaccination 06/07/2020, 07/01/2020   PPD Test 03/26/2018   Pneumococcal Polysaccharide-23 08/17/2021   Tdap 01/15/2018    Past Medical History:  Diagnosis Date   Acute bronchitis    Cough 03/2019   GERD (gastroesophageal reflux disease) 03/2019   Left breast mass    Smoker    Vitamin D deficiency 09/2020    Tobacco History: Social History   Tobacco Use  Smoking Status Former   Packs/day: .5   Types: Cigarettes, Cigars   Quit date: 03/30/2021   Years since quitting: 2.0  Smokeless Tobacco Never  Tobacco Comments   Black and mild's     Counseling given: Not Answered Tobacco comments: Black and mild's     Outpatient Encounter Medications as of 04/30/2023  Medication Sig   amLODipine (NORVASC) 5 MG tablet Take 1 tablet (5 mg total) by mouth daily.   Blood Glucose  Monitoring Suppl (BLOOD GLUCOSE MONITOR SYSTEM) w/Device KIT Use up to 4 (four) times daily as directed.   cetirizine (ZYRTEC) 10 MG chewable tablet Chew 10 mg by mouth daily.   fluticasone (FLONASE) 50 MCG/ACT nasal spray Place 2 sprays into both nostrils daily.   glucose blood test strip Use up to 4 (four) times daily as directed.   hydrOXYzine (ATARAX) 10 MG tablet Take 1 tablet (10 mg total) by mouth 3 (three) times daily as needed.   Lancets (FREESTYLE) lancets Use up to 4 (four) times daily as directed.   metroNIDAZOLE (FLAGYL) 500 MG tablet Take 1 tablet (500 mg total) by mouth 2 (two) times daily for 7 days   Semaglutide,0.25 or 0.5MG /DOS, 2 MG/3ML SOPN Inject 0.25 mg into the skin once a week.   celecoxib (CELEBREX) 200 MG capsule Take 1 capsule (200 mg total) by mouth 2 (two) times daily. (Patient not taking: Reported on 04/30/2023)   oxyCODONE-acetaminophen (PERCOCET/ROXICET) 5-325 MG tablet Take 1 tablet by mouth every 4 (four) hours as needed for severe pain. (Patient not taking: Reported on 04/30/2023)   oxyCODONE-acetaminophen (PERCOCET/ROXICET) 5-325 MG tablet Take 1 tablet by mouth every 4 (four) hours as needed for pain (Patient not taking: Reported on 04/30/2023)   predniSONE (DELTASONE) 10 MG tablet Take 4 tabs for 2 days, then 3 tabs for 2 days, then 2 tabs for 2 days, then 1 tab for 2 days, then stop (  Patient not taking: Reported on 04/30/2023)   promethazine-dextromethorphan (PROMETHAZINE-DM) 6.25-15 MG/5ML syrup Take 2.5 mLs by mouth 4 (four) times daily as needed for cough. (Patient not taking: Reported on 04/30/2023)   No facility-administered encounter medications on file as of 04/30/2023.     Review of Systems  Review of Systems  Constitutional: Negative.   HENT: Negative.    Cardiovascular: Negative.   Gastrointestinal: Negative.   Allergic/Immunologic: Negative.   Neurological: Negative.   Psychiatric/Behavioral: Negative.         Physical Exam  BP 123/79   Pulse  80   Temp (!) 97.3 F (36.3 C)   Wt 287 lb (130.2 kg)   SpO2 100%   BMI 47.76 kg/m   Wt Readings from Last 5 Encounters:  04/30/23 287 lb (130.2 kg)  01/22/23 280 lb 6.4 oz (127.2 kg)  12/23/22 291 lb 0.1 oz (132 kg)  08/09/22 291 lb (132 kg)  02/17/22 291 lb 6.4 oz (132.2 kg)     Physical Exam Vitals and nursing note reviewed.  Constitutional:      General: She is not in acute distress.    Appearance: She is well-developed.  Cardiovascular:     Rate and Rhythm: Normal rate and regular rhythm.  Pulmonary:     Effort: Pulmonary effort is normal.     Breath sounds: Normal breath sounds.  Neurological:     Mental Status: She is alert and oriented to person, place, and time.      Lab Results:  CBC    Component Value Date/Time   WBC 14.7 (H) 08/09/2022 1434   WBC 15.8 (H) 10/25/2021 2137   RBC 5.04 08/09/2022 1434   RBC 5.15 (H) 10/25/2021 2137   HGB 13.2 08/09/2022 1434   HCT 39.3 08/09/2022 1434   PLT 342 08/09/2022 1434   MCV 78 (L) 08/09/2022 1434   MCH 26.2 (L) 08/09/2022 1434   MCH 25.4 (L) 10/25/2021 2137   MCHC 33.6 08/09/2022 1434   MCHC 31.3 10/25/2021 2137   RDW 15.0 08/09/2022 1434   LYMPHSABS 5.6 (H) 10/25/2021 2137   LYMPHSABS 4.7 (H) 09/27/2020 1504   MONOABS 1.0 10/25/2021 2137   EOSABS 0.3 10/25/2021 2137   EOSABS 0.3 09/27/2020 1504   BASOSABS 0.1 10/25/2021 2137   BASOSABS 0.1 09/27/2020 1504    BMET    Component Value Date/Time   NA 140 08/09/2022 1434   K 3.8 08/09/2022 1434   CL 105 08/09/2022 1434   CO2 22 08/09/2022 1434   GLUCOSE 123 (H) 08/09/2022 1434   GLUCOSE 98 10/25/2021 2151   BUN 11 08/09/2022 1434   CREATININE 0.83 08/09/2022 1434   CALCIUM 9.0 08/09/2022 1434   GFRNONAA >60 10/25/2021 2137   GFRAA 96 04/21/2019 1527     Assessment & Plan:   Snoring - Ambulatory referral to Pulmonology  2. Diabetes mellitus screening  - POCT glycosylated hemoglobin (Hb A1C)  3. Nocturia  - POCT Urinalysis DIP  (Proadvantage Device)  4. Screen for STD (sexually transmitted disease)  - Chlamydia/Gonococcus/Trichomonas, NAA - NuSwab Vaginitis Plus (VG+) - RPR+HIV+GC+CT Panel  5. Vitamin D deficiency  - Vitamin D, 25-hydroxy  6. Vitamin B12 deficiency  - Vitamin B12  7. Class 3 severe obesity due to excess calories without serious comorbidity with body mass index (BMI) of 40.0 to 44.9 in adult (HCC)  - CBC - Comprehensive metabolic panel - Lipid Panel  8. Peripheral edema  - Brain natriuretic peptide  -elevate legs  -wear compression hose  -  low salt diet  9. Hives  - hydrOXYzine (ATARAX) 10 MG tablet; Take 1 tablet (10 mg total) by mouth 3 (three) times daily as needed.  Dispense: 30 tablet; Refill: 0  Follow up:  Follow up in 6 months     Ivonne Andrew, NP 04/30/2023

## 2023-04-30 NOTE — Assessment & Plan Note (Signed)
-   Ambulatory referral to Pulmonology  2. Diabetes mellitus screening  - POCT glycosylated hemoglobin (Hb A1C)  3. Nocturia  - POCT Urinalysis DIP (Proadvantage Device)  4. Screen for STD (sexually transmitted disease)  - Chlamydia/Gonococcus/Trichomonas, NAA - NuSwab Vaginitis Plus (VG+) - RPR+HIV+GC+CT Panel  5. Vitamin D deficiency  - Vitamin D, 25-hydroxy  6. Vitamin B12 deficiency  - Vitamin B12  7. Class 3 severe obesity due to excess calories without serious comorbidity with body mass index (BMI) of 40.0 to 44.9 in adult (HCC)  - CBC - Comprehensive metabolic panel - Lipid Panel  8. Peripheral edema  - Brain natriuretic peptide  -elevate legs  -wear compression hose  -low salt diet  9. Hives  - hydrOXYzine (ATARAX) 10 MG tablet; Take 1 tablet (10 mg total) by mouth 3 (three) times daily as needed.  Dispense: 30 tablet; Refill: 0  Follow up:  Follow up in 6 months

## 2023-04-30 NOTE — Patient Instructions (Signed)
1. Snoring  - Ambulatory referral to Pulmonology  2. Diabetes mellitus screening  - POCT glycosylated hemoglobin (Hb A1C)  3. Nocturia  - POCT Urinalysis DIP (Proadvantage Device)  4. Screen for STD (sexually transmitted disease)  - Chlamydia/Gonococcus/Trichomonas, NAA - NuSwab Vaginitis Plus (VG+) - RPR+HIV+GC+CT Panel  5. Vitamin D deficiency  - Vitamin D, 25-hydroxy  6. Vitamin B12 deficiency  - Vitamin B12  7. Class 3 severe obesity due to excess calories without serious comorbidity with body mass index (BMI) of 40.0 to 44.9 in adult (HCC)  - CBC - Comprehensive metabolic panel - Lipid Panel  8. Peripheral edema  - Brain natriuretic peptide  -elevate legs  -wear compression hose  -low salt diet  9. Hives  - hydrOXYzine (ATARAX) 10 MG tablet; Take 1 tablet (10 mg total) by mouth 3 (three) times daily as needed.  Dispense: 30 tablet; Refill: 0  Follow up:  Follow up in 6 months

## 2023-05-01 ENCOUNTER — Other Ambulatory Visit (HOSPITAL_COMMUNITY): Payer: Self-pay

## 2023-05-01 ENCOUNTER — Telehealth: Payer: Self-pay | Admitting: Nurse Practitioner

## 2023-05-01 ENCOUNTER — Other Ambulatory Visit: Payer: Self-pay

## 2023-05-01 DIAGNOSIS — I10 Essential (primary) hypertension: Secondary | ICD-10-CM

## 2023-05-01 LAB — COMPREHENSIVE METABOLIC PANEL
ALT: 11 IU/L (ref 0–32)
AST: 14 IU/L (ref 0–40)
Albumin: 3.7 g/dL — ABNORMAL LOW (ref 3.9–4.9)
Alkaline Phosphatase: 66 IU/L (ref 44–121)
BUN/Creatinine Ratio: 11 (ref 9–23)
BUN: 10 mg/dL (ref 6–20)
Bilirubin Total: 0.3 mg/dL (ref 0.0–1.2)
CO2: 23 mmol/L (ref 20–29)
Calcium: 8.6 mg/dL — ABNORMAL LOW (ref 8.7–10.2)
Chloride: 107 mmol/L — ABNORMAL HIGH (ref 96–106)
Creatinine, Ser: 0.87 mg/dL (ref 0.57–1.00)
Globulin, Total: 1.9 g/dL (ref 1.5–4.5)
Glucose: 109 mg/dL — ABNORMAL HIGH (ref 70–99)
Potassium: 4.3 mmol/L (ref 3.5–5.2)
Sodium: 143 mmol/L (ref 134–144)
Total Protein: 5.6 g/dL — ABNORMAL LOW (ref 6.0–8.5)
eGFR: 88 mL/min/{1.73_m2} (ref >59)

## 2023-05-01 LAB — LIPID PANEL
Chol/HDL Ratio: 4 ratio (ref 0.0–4.4)
Cholesterol, Total: 151 mg/dL (ref 100–199)
HDL: 38 mg/dL — ABNORMAL LOW (ref >39)
LDL Chol Calc (NIH): 70 mg/dL (ref 0–99)
Triglycerides: 267 mg/dL — ABNORMAL HIGH (ref 0–149)
VLDL Cholesterol Cal: 43 mg/dL — ABNORMAL HIGH (ref 5–40)

## 2023-05-01 LAB — VITAMIN D 25 HYDROXY (VIT D DEFICIENCY, FRACTURES): Vit D, 25-Hydroxy: <4 ng/mL — ABNORMAL LOW (ref 30.0–100.0)

## 2023-05-01 LAB — CBC
Hematocrit: 35.9 % (ref 34.0–46.6)
Hemoglobin: 11.6 g/dL (ref 11.1–15.9)
MCH: 25.3 pg — ABNORMAL LOW (ref 26.6–33.0)
MCHC: 32.3 g/dL (ref 31.5–35.7)
MCV: 78 fL — ABNORMAL LOW (ref 79–97)
Platelets: 302 10*3/uL (ref 150–450)
RBC: 4.58 x10E6/uL (ref 3.77–5.28)
RDW: 14.2 % (ref 11.7–15.4)
WBC: 13.7 10*3/uL — ABNORMAL HIGH (ref 3.4–10.8)

## 2023-05-01 LAB — VITAMIN B12: Vitamin B-12: 279 pg/mL (ref 232–1245)

## 2023-05-01 LAB — BRAIN NATRIURETIC PEPTIDE: BNP: 18.6 pg/mL (ref 0.0–100.0)

## 2023-05-01 MED ORDER — AMLODIPINE BESYLATE 5 MG PO TABS
5.0000 mg | ORAL_TABLET | Freq: Every day | ORAL | 0 refills | Status: DC
  Filled 2023-05-01: qty 90, 90d supply, fill #0

## 2023-05-01 NOTE — Telephone Encounter (Signed)
Done KH 

## 2023-05-01 NOTE — Telephone Encounter (Signed)
Caller & Relationship to patient:  MRN #  [id number]   Call Back Number:   Date of Last Office Visit: 04/30/2023     Date of Next Office Visit: 05/28/2023    Medication(s) to be Refilled: Amlodipine   Preferred Pharmacy:   ** Please notify patient to allow 48-72 hours to process** **Let patient know to contact pharmacy at the end of the day to make sure medication is ready. ** **If patient has not been seen in a year or longer, book an appointment **Advise to use MyChart for refill requests OR to contact their pharmacy

## 2023-05-02 ENCOUNTER — Other Ambulatory Visit: Payer: Self-pay | Admitting: Nurse Practitioner

## 2023-05-02 ENCOUNTER — Other Ambulatory Visit (HOSPITAL_COMMUNITY): Payer: Self-pay

## 2023-05-02 DIAGNOSIS — B9689 Other specified bacterial agents as the cause of diseases classified elsewhere: Secondary | ICD-10-CM

## 2023-05-02 LAB — CHLAMYDIA/GONOCOCCUS/TRICHOMONAS, NAA
Chlamydia by NAA: NEGATIVE
Gonococcus by NAA: NEGATIVE
Trich vag by NAA: NEGATIVE

## 2023-05-02 LAB — RPR+HIV+GC+CT PANEL
Chlamydia trachomatis, NAA: NEGATIVE
HIV Screen 4th Generation wRfx: NONREACTIVE
Neisseria Gonorrhoeae by PCR: NEGATIVE
RPR Ser Ql: NONREACTIVE

## 2023-05-02 LAB — NUSWAB VAGINITIS PLUS (VG+)
Atopobium vaginae: HIGH Score — AB
BVAB 2: HIGH Score — AB
Candida albicans, NAA: NEGATIVE
Candida glabrata, NAA: NEGATIVE
Chlamydia trachomatis, NAA: NEGATIVE
Megasphaera 1: HIGH Score — AB
Neisseria gonorrhoeae, NAA: NEGATIVE
Trich vag by NAA: NEGATIVE

## 2023-05-02 MED ORDER — METRONIDAZOLE 500 MG PO TABS
500.0000 mg | ORAL_TABLET | Freq: Two times a day (BID) | ORAL | 0 refills | Status: DC
  Filled 2023-05-02: qty 14, 7d supply, fill #0

## 2023-05-02 MED ORDER — VITAMIN D (ERGOCALCIFEROL) 1.25 MG (50000 UNIT) PO CAPS
50000.0000 [IU] | ORAL_CAPSULE | ORAL | 2 refills | Status: DC
  Filled 2023-05-02: qty 5, 35d supply, fill #0
  Filled 2023-06-03 – 2023-07-25 (×2): qty 5, 35d supply, fill #1
  Filled 2023-10-13: qty 5, 35d supply, fill #2

## 2023-05-04 ENCOUNTER — Other Ambulatory Visit (HOSPITAL_COMMUNITY): Payer: Self-pay

## 2023-05-09 ENCOUNTER — Other Ambulatory Visit (HOSPITAL_COMMUNITY): Payer: Self-pay

## 2023-05-14 ENCOUNTER — Other Ambulatory Visit (HOSPITAL_COMMUNITY): Payer: Self-pay

## 2023-05-24 ENCOUNTER — Other Ambulatory Visit (HOSPITAL_COMMUNITY): Payer: Self-pay

## 2023-05-28 ENCOUNTER — Ambulatory Visit: Payer: Self-pay | Admitting: Nurse Practitioner

## 2023-05-31 ENCOUNTER — Other Ambulatory Visit (HOSPITAL_COMMUNITY): Payer: Self-pay

## 2023-06-01 ENCOUNTER — Other Ambulatory Visit (HOSPITAL_COMMUNITY): Payer: Self-pay

## 2023-06-14 ENCOUNTER — Other Ambulatory Visit (HOSPITAL_COMMUNITY): Payer: Self-pay

## 2023-06-18 ENCOUNTER — Other Ambulatory Visit (HOSPITAL_COMMUNITY): Payer: Self-pay

## 2023-07-25 ENCOUNTER — Ambulatory Visit: Payer: BLUE CROSS/BLUE SHIELD | Admitting: Adult Health

## 2023-07-25 ENCOUNTER — Encounter: Payer: Self-pay | Admitting: Adult Health

## 2023-07-25 ENCOUNTER — Encounter: Payer: Self-pay | Admitting: *Deleted

## 2023-07-25 VITALS — BP 118/90 | HR 84 | Ht 65.38 in | Wt 283.6 lb

## 2023-07-25 DIAGNOSIS — G4700 Insomnia, unspecified: Secondary | ICD-10-CM | POA: Diagnosis not present

## 2023-07-25 DIAGNOSIS — R0683 Snoring: Secondary | ICD-10-CM | POA: Diagnosis not present

## 2023-07-25 DIAGNOSIS — Z6841 Body Mass Index (BMI) 40.0 and over, adult: Secondary | ICD-10-CM | POA: Diagnosis not present

## 2023-07-25 NOTE — Patient Instructions (Signed)
Set up for home sleep study  Work on healthy weight  Healthy sleep regimen as discussed  Follow up in 6 weeks to discuss results and treatment plan

## 2023-07-25 NOTE — Progress Notes (Unsigned)
@Patient  ID: Ariana Rogers, female    DOB: May 18, 1986, 37 y.o.   MRN: [id number]  Chief Complaint  Patient presents with   Consult    Referring provider: Ivonne Andrew, NP  HPI: 37 year old female seen for sleep consult July 25, 2023 for restless sleep, snoring, daytime sleepiness, fatigue and chronic insomnia   TEST/EVENTS :   07/25/2023 Sleep consult  Patient presents today for a sleep consult.  Kindly referred by primary care provider Angus Seller, NP.  Patient complains of loud snoring, restless sleep, fatigue, low energy, daytime sleepiness.  Also complains of chronic insomnia.  Says she has trouble going to sleep at times can take hours to go to sleep and has trouble turning off her mind at night.  Also has trouble with waking up frequently.  Typically goes to bed between 11 PM and 2 AM.  Gets up at 7:30 AM.  Weight is up a few pounds.  Current weight is at 283 pounds with a BMI at 46.  Patient has had no previous sleep study.  Has no history of congestive heart failure or stroke.  She does not take any sleep aids.  She has no removable dental work.  Typically will take 1 nap a day for around 1 hour.  Drinks 3 caffeinated sodas daily.  Epworth score is 12 out of 24.  Typically gets sleepy if she sits down to rest, watch TV, in the evening hours.  No symptoms suspicious for cataplexy or sleep paralysis.  She says she is active and works full-time but does not exercise on a regular basis.  Feels she is tired all the time and wakes up tired.  Also very concerned that she never seems to sleep for more than a couple hours at a time.  Medical history significant for hypertension, chronic allergies, obesity with BMI at 46, GERD,. DM   Social history patient is single.  Has 3 children aged 76, 79 and 27.  She works Water engineer.  She smokes 2 cigars daily.  Social alcohol.  Occasionally uses marijuana.  Family history is positive for allergies, asthma, heart disease, cancer, rheumatoid  arthritis  Past Surgical History:  Procedure Laterality Date   DENTAL SURGERY     TUBAL LIGATION  2 years ago       No Known Allergies  Immunization History  Administered Date(s) Administered   Influenza,inj,Quad PF,6+ Mos 01/15/2018, 09/27/2020, 08/17/2021   PFIZER(Purple Top)SARS-COV-2 Vaccination 06/07/2020, 07/01/2020   PPD Test 03/26/2018   Pneumococcal Polysaccharide-23 08/17/2021   Tdap 01/15/2018    Past Medical History:  Diagnosis Date   Acute bronchitis    Cough 03/2019   GERD (gastroesophageal reflux disease) 03/2019   Left breast mass    Smoker    Vitamin D deficiency 09/2020    Tobacco History: Social History   Tobacco Use  Smoking Status Former   Current packs/day: 0.00   Types: Cigarettes, Cigars   Quit date: 03/30/2021   Years since quitting: 2.3  Smokeless Tobacco Never  Tobacco Comments   Black and mild's     Counseling given: Not Answered Tobacco comments: Black and mild's     Outpatient Medications Prior to Visit  Medication Sig Dispense Refill   amLODipine (NORVASC) 5 MG tablet Take 1 tablet (5 mg total) by mouth daily. 90 tablet 0   Blood Glucose Monitoring Suppl (BLOOD GLUCOSE MONITOR SYSTEM) w/Device KIT Use up to 4 (four) times daily as directed. 1 kit 0   cetirizine (ZYRTEC) 10 MG chewable  tablet Chew 10 mg by mouth daily as needed.     fluticasone (FLONASE) 50 MCG/ACT nasal spray Place 2 sprays into both nostrils daily. 16 g 6   glucose blood test strip Use up to 4 (four) times daily as directed. 100 each 0   hydrOXYzine (ATARAX) 10 MG tablet Take 1 tablet (10 mg total) by mouth 3 (three) times daily as needed. 30 tablet 0   Lancets (FREESTYLE) lancets Use up to 4 (four) times daily as directed. 100 each 0   Vitamin D, Ergocalciferol, (DRISDOL) 1.25 MG (50000 UNIT) CAPS capsule Take 1 capsule (50,000 Units total) by mouth every 7 (seven) days. 5 capsule 2   Semaglutide,0.25 or 0.5MG /DOS, 2 MG/3ML SOPN Inject 0.25 mg into the skin  once a week. (Patient not taking: Reported on 07/25/2023) 3 mL 2   celecoxib (CELEBREX) 200 MG capsule Take 1 capsule (200 mg total) by mouth 2 (two) times daily. (Patient not taking: Reported on 07/25/2023) 60 capsule 0   metroNIDAZOLE (FLAGYL) 500 MG tablet Take 1 tablet (500 mg total) by mouth 2 (two) times daily for 7 days (Patient not taking: Reported on 07/25/2023) 14 tablet 0   oxyCODONE-acetaminophen (PERCOCET/ROXICET) 5-325 MG tablet Take 1 tablet by mouth every 4 (four) hours as needed for severe pain. (Patient not taking: Reported on 04/30/2023) 15 tablet 0   oxyCODONE-acetaminophen (PERCOCET/ROXICET) 5-325 MG tablet Take 1 tablet by mouth every 4 (four) hours as needed for pain (Patient not taking: Reported on 04/30/2023) 42 tablet 0   predniSONE (DELTASONE) 10 MG tablet Take 4 tabs for 2 days, then 3 tabs for 2 days, then 2 tabs for 2 days, then 1 tab for 2 days, then stop (Patient not taking: Reported on 07/25/2023) 20 tablet 0   promethazine-dextromethorphan (PROMETHAZINE-DM) 6.25-15 MG/5ML syrup Take 2.5 mLs by mouth 4 (four) times daily as needed for cough. (Patient not taking: Reported on 04/30/2023) 118 mL 0   No facility-administered medications prior to visit.     Review of Systems:   Constitutional:   No  weight loss, night sweats,  Fevers, chills, + fatigue, or  lassitude.  HEENT:   No headaches,  Difficulty swallowing,  Tooth/dental problems, or  Sore throat,                No sneezing, itching, ear ache, nasal congestion, post nasal drip,   CV:  No chest pain,  Orthopnea, PND, swelling in lower extremities, anasarca, dizziness, palpitations, syncope.   GI  No heartburn, indigestion, abdominal pain, nausea, vomiting, diarrhea, change in bowel habits, loss of appetite, bloody stools.   Resp:   No chest wall deformity  Skin: no rash or lesions.  GU: no dysuria, change in color of urine, no urgency or frequency.  No flank pain, no hematuria   MS:  No joint pain or swelling.   No decreased range of motion.  No back pain.    Physical Exam    GEN: A/Ox3; pleasant , NAD, well nourished    HEENT:  [place]/AT,  , NOSE-clear, THROAT-clear, no lesions, no postnasal drip or exudate noted. Class 3-4 MP airway   NECK:  Supple w/ fair ROM; no JVD; normal carotid impulses w/o bruits; no thyromegaly or nodules palpated; no lymphadenopathy.    RESP  Clear  P & A; w/o, wheezes/ rales/ or rhonchi. no accessory muscle use, no dullness to percussion  CARD:  RRR, no m/r/g, no peripheral edema, pulses intact, no cyanosis or clubbing.  GI:  Soft & nt; nml bowel sounds; no organomegaly or masses detected.   Musco: Warm bil, no deformities or joint swelling noted.   Neuro: alert, no focal deficits noted.    Skin: Warm, no lesions or rashes         ProBNP No results found for: "PROBNP"  Imaging: No results found.  Administration History     None           No data to display          No results found for: "NITRICOXIDE"      Assessment & Plan:   No problem-specific Assessment & Plan notes found for this encounter.     Rubye Oaks, NP 07/25/2023

## 2023-07-26 ENCOUNTER — Other Ambulatory Visit (HOSPITAL_COMMUNITY): Payer: Self-pay

## 2023-07-26 ENCOUNTER — Other Ambulatory Visit: Payer: Self-pay

## 2023-07-26 DIAGNOSIS — G4700 Insomnia, unspecified: Secondary | ICD-10-CM | POA: Insufficient documentation

## 2023-07-26 NOTE — Assessment & Plan Note (Signed)
Chronic insomnia.  Patient has difficulty going to sleep and staying asleep.  We discussed a healthy sleep regimen.  Advised to decreased caffeine intake.  Increase activity/exercise during the daytime as able/tolerated.  Will set up for home sleep study to rule out sleep apnea.  Pending these results we will decide if short-term use of sleep aids such as Ambien or Lunesta might be indicated.

## 2023-07-26 NOTE — Assessment & Plan Note (Signed)
Morbid obesity with a BMI of 46.  We discussed healthy weight loss.

## 2023-07-26 NOTE — Assessment & Plan Note (Signed)
Snoring, restless sleep, daytime sleepiness, fatigue, BMI 46 are all concerning for underlying sleep apnea.  Patient education given on sleep apnea and potential complications of untreated sleep apnea.  Will set patient up for a home sleep study. - discussed how weight can impact sleep and risk for sleep disordered breathing - discussed options to assist with weight loss: combination of diet modification, cardiovascular and strength training exercises   - had an extensive discussion regarding the adverse health consequences related to untreated sleep disordered breathing - specifically discussed the risks for hypertension, coronary artery disease, cardiac dysrhythmias, cerebrovascular disease, and diabetes - lifestyle modification discussed   - discussed how sleep disruption can increase risk of accidents, particularly when driving - safe driving practices were discussed   Plan  Patient Instructions  Set up for home sleep study  Work on healthy weight  Healthy sleep regimen as discussed  Follow up in 6 weeks to discuss results and treatment plan

## 2023-07-31 ENCOUNTER — Ambulatory Visit: Payer: Commercial Managed Care - HMO | Admitting: Dermatology

## 2023-08-02 ENCOUNTER — Ambulatory Visit (INDEPENDENT_AMBULATORY_CARE_PROVIDER_SITE_OTHER): Payer: BLUE CROSS/BLUE SHIELD | Admitting: Nurse Practitioner

## 2023-08-02 ENCOUNTER — Other Ambulatory Visit (INDEPENDENT_AMBULATORY_CARE_PROVIDER_SITE_OTHER): Payer: BLUE CROSS/BLUE SHIELD | Admitting: Pharmacist

## 2023-08-02 ENCOUNTER — Other Ambulatory Visit (HOSPITAL_COMMUNITY): Payer: Self-pay

## 2023-08-02 ENCOUNTER — Encounter: Payer: Self-pay | Admitting: Nurse Practitioner

## 2023-08-02 VITALS — BP 124/82 | HR 80 | Temp 97.6°F | Wt 289.0 lb

## 2023-08-02 DIAGNOSIS — Z113 Encounter for screening for infections with a predominantly sexual mode of transmission: Secondary | ICD-10-CM | POA: Diagnosis not present

## 2023-08-02 DIAGNOSIS — I10 Essential (primary) hypertension: Secondary | ICD-10-CM

## 2023-08-02 DIAGNOSIS — M25562 Pain in left knee: Secondary | ICD-10-CM | POA: Diagnosis not present

## 2023-08-02 DIAGNOSIS — L0291 Cutaneous abscess, unspecified: Secondary | ICD-10-CM | POA: Diagnosis not present

## 2023-08-02 MED ORDER — PREDNISONE 20 MG PO TABS
20.0000 mg | ORAL_TABLET | Freq: Every day | ORAL | 0 refills | Status: AC
  Filled 2023-08-02: qty 5, 5d supply, fill #0

## 2023-08-02 MED ORDER — CLINDAMYCIN PHOSPHATE 1 % EX GEL
Freq: Two times a day (BID) | CUTANEOUS | 0 refills | Status: DC
  Filled 2023-08-02: qty 30, 30d supply, fill #0

## 2023-08-02 MED ORDER — AMLODIPINE BESYLATE 5 MG PO TABS
5.0000 mg | ORAL_TABLET | Freq: Every day | ORAL | 0 refills | Status: DC
  Filled 2023-08-02: qty 90, 90d supply, fill #0

## 2023-08-02 MED ORDER — CEPHALEXIN 500 MG PO CAPS
500.0000 mg | ORAL_CAPSULE | Freq: Three times a day (TID) | ORAL | 0 refills | Status: DC
  Filled 2023-08-02: qty 30, 10d supply, fill #0

## 2023-08-02 NOTE — Progress Notes (Signed)
Subjective   Patient ID: Ariana Rogers, female    DOB: Nov 05, 1985, 37 y.o.   MRN: [id number]  Chief Complaint  Patient presents with   Knee Pain   Mass    Pelvic area  and on vagina lip.   std  screening    Referring provider: Ivonne Andrew, NP  Ariana Rogers is a 37 y.o. female with Past Medical History: No date: Acute bronchitis 03/2019: Cough 03/2019: GERD (gastroesophageal reflux disease) No date: Left breast mass No date: Smoker 09/2020: Vitamin D deficiency   HPI  Patient presents today for an acute visit.  She states that she does have a cutaneous abscess to her buttock.  She is also concerned about pain and swelling to left labia.  Upon exam it does appear to be Bartholin cyst/abscess.  We will trial Keflex.  We will order clindamycin gel.  The patient is also concerned about left knee pain.  She states that she did injure her knee a few years ago and does have pain on and off from this.  Recently her knee has started swelling and she is feeling a lot of tightness behind the knee.  Upon exam there is concern for Baker's cyst.  We will place a referral to orthopedics for further evaluation and treatment.  Denies f/c/s, n/v/d, hemoptysis, PND, leg swelling Denies chest pain or edema      No Known Allergies  Immunization History  Administered Date(s) Administered   Influenza,inj,Quad PF,6+ Mos 01/15/2018, 09/27/2020, 08/17/2021   PFIZER(Purple Top)SARS-COV-2 Vaccination 06/07/2020, 07/01/2020   PPD Test 03/26/2018   Pneumococcal Polysaccharide-23 08/17/2021   Tdap 01/15/2018    Tobacco History: Social History   Tobacco Use  Smoking Status Every Day   Current packs/day: 0.00   Types: Cigarettes, Cigars   Last attempt to quit: 03/30/2021   Years since quitting: 2.3  Smokeless Tobacco Never  Tobacco Comments   Black and mild's  2 per day for >10 years.  HFB 07/25/2023   Ready to quit: Not Answered Counseling given: Not Answered Tobacco comments: Black  and mild's  2 per day for >10 years.  HFB 07/25/2023   Outpatient Encounter Medications as of 08/02/2023  Medication Sig   Blood Glucose Monitoring Suppl (BLOOD GLUCOSE MONITOR SYSTEM) w/Device KIT Use up to 4 (four) times daily as directed.   cephALEXin (KEFLEX) 500 MG capsule Take 1 capsule (500 mg total) by mouth 3 (three) times daily.   cetirizine (ZYRTEC) 10 MG chewable tablet Chew 10 mg by mouth daily as needed.   clindamycin (CLINDAGEL) 1 % gel Apply topically 2 (two) times daily.   fluticasone (FLONASE) 50 MCG/ACT nasal spray Place 2 sprays into both nostrils daily.   glucose blood test strip Use up to 4 (four) times daily as directed.   hydrOXYzine (ATARAX) 10 MG tablet Take 1 tablet (10 mg total) by mouth 3 (three) times daily as needed.   Lancets (FREESTYLE) lancets Use up to 4 (four) times daily as directed.   predniSONE (DELTASONE) 20 MG tablet Take 1 tablet (20 mg total) by mouth daily with breakfast for 5 days.   Vitamin D, Ergocalciferol, (DRISDOL) 1.25 MG (50000 UNIT) CAPS capsule Take 1 capsule (50,000 Units total) by mouth every 7 (seven) days.   [DISCONTINUED] amLODipine (NORVASC) 5 MG tablet Take 1 tablet (5 mg total) by mouth daily.   amLODipine (NORVASC) 5 MG tablet Take 1 tablet (5 mg total) by mouth daily.   Semaglutide,0.25 or 0.5MG /DOS, 2 MG/3ML SOPN Inject 0.25  mg into the skin once a week. (Patient not taking: Reported on 07/25/2023)   No facility-administered encounter medications on file as of 08/02/2023.    Review of Systems  Review of Systems  Constitutional: Negative.   HENT: Negative.    Cardiovascular: Negative.   Gastrointestinal: Negative.   Genitourinary:  Positive for genital sores and vaginal pain.  Musculoskeletal:  Positive for arthralgias, joint swelling and myalgias.  Allergic/Immunologic: Negative.   Neurological: Negative.   Psychiatric/Behavioral: Negative.       Objective:   BP 124/82   Pulse 80   Temp 97.6 F (36.4 C)   Wt 289 lb  (131.1 kg)   SpO2 100%   BMI 47.54 kg/m   Wt Readings from Last 5 Encounters:  08/02/23 289 lb (131.1 kg)  07/25/23 283 lb 9.6 oz (128.6 kg)  04/30/23 287 lb (130.2 kg)  01/22/23 280 lb 6.4 oz (127.2 kg)  12/23/22 291 lb 0.1 oz (132 kg)     Physical Exam Vitals and nursing note reviewed.  Constitutional:      General: She is not in acute distress.    Appearance: She is well-developed.  Cardiovascular:     Rate and Rhythm: Normal rate and regular rhythm.  Pulmonary:     Effort: Pulmonary effort is normal.     Breath sounds: Normal breath sounds.  Genitourinary:      Comments: Raised area consistent with bartholin cyst to left labia.  Neurological:     Mental Status: She is alert and oriented to person, place, and time.       Assessment & Plan:   Cutaneous abscess, unspecified site -     Cephalexin; Take 1 capsule (500 mg total) by mouth 3 (three) times daily.  Dispense: 30 capsule; Refill: 0 -     Clindamycin Phosphate; Apply topically 2 (two) times daily.  Dispense: 30 g; Refill: 0  Primary hypertension -     amLODIPine Besylate; Take 1 tablet (5 mg total) by mouth daily.  Dispense: 90 tablet; Refill: 0  Acute pain of left knee -     predniSONE; Take 1 tablet (20 mg total) by mouth daily with breakfast for 5 days.  Dispense: 5 tablet; Refill: 0 -     Ambulatory referral to Orthopedics  Screen for STD (sexually transmitted disease) -     NuSwab Vaginitis Plus (VG+)     Return if symptoms worsen or fail to improve.   Ivonne Andrew, NP 08/02/2023

## 2023-08-02 NOTE — Progress Notes (Signed)
Care Coordination Call  Received message from pharmacy that PA is required for Ozempic. Started PA via Cover My Meds. Denies history of alternative pharmacotherapy for management of diabetes.   PA Key [id number]  Janeice Robinson, PharmD, BCACP, CPP Clinical Pharmacist [facility] [phone]

## 2023-08-02 NOTE — Patient Instructions (Addendum)
1. Primary hypertension  - amLODipine (NORVASC) 5 MG tablet; Take 1 tablet (5 mg total) by mouth daily.  Dispense: 90 tablet; Refill: 0  2. Cutaneous abscess, unspecified site  - cephALEXin (KEFLEX) 500 MG capsule; Take 1 capsule (500 mg total) by mouth 3 (three) times daily.  Dispense: 30 capsule; Refill: 0 - clindamycin (CLINDAGEL) 1 % gel; Apply topically 2 (two) times daily.  Dispense: 30 g; Refill: 0  3. Acute pain of left knee  - predniSONE (DELTASONE) 20 MG tablet; Take 1 tablet (20 mg total) by mouth daily with breakfast for 5 days.  Dispense: 5 tablet; Refill: 0 - AMB referral to orthopedics  4. Screen for STD (sexually transmitted disease)  - NuSwab Vaginitis Plus (VG+)   Bartholin's Cyst  A Bartholin's cyst is a fluid-filled sac that forms on a Bartholin's gland. Bartholin's glands are small glands in the folds of skin near the opening of the vagina (labia). This type of cyst causes a bulge or lump near the opening of the vagina. If you have a cyst that is small and not infected, you may be able to take care of it at home. If your cyst gets infected, it may cause pain and your doctor may need to drain it. What are the causes? This condition may be caused by a blocked Bartholin's gland. Germs (bacteria) inside of the cyst can cause an infection. What are the signs or symptoms? A bulge or lump near the opening of the vagina. Discomfort or pain. Redness, swelling, or fluid draining from the area. How is this treated? You may not need treatment if your cyst is not causing symptoms. The cyst can go away on its own with home care. Home care includes hot baths or heat therapy. Large cysts or cysts that are infected may be treated with: Antibiotic medicine. A procedure to drain the fluid. Cysts that keep coming back will need to be drained many times. Your doctor may talk to you about surgery to remove the cyst. Follow these instructions at home: Medicines Take  over-the-counter and prescription medicines only as told by your doctor. If you were prescribed an antibiotic medicine, take it as told by your doctor. Do not stop taking it even if you start to feel better. Managing pain and swelling Try sitz baths to help with pain and swelling. A sitz bath is a warm water bath in which the water only comes up to your hips and should cover your buttocks. You may take sitz baths a few times a day. If told, put heat on the affected area as often as needed. Use the heat source that your doctor recommends, such as a moist heat pack or a heating pad. Place a towel between your skin and the heat source. Leave the heat on for 20-30 minutes. Take off the heat if your skin turns bright red. This is very important. If you cannot feel pain, heat, or cold, you have a greater risk of getting burned. General instructions If your cyst was drained: Follow instructions from your doctor about how to take care of your wound. Use feminine pads to absorb any fluid. Do not push on or squeeze your cyst. Do not have sex until the cyst has gone away or your wound from drainage has healed. Take these steps to help prevent a cyst from returning, and to prevent other cysts from forming: Take a bath or shower once a day. Clean the area around your vagina with mild soap and water  when you bathe. Practice safe sex to prevent STIs. Talk with your doctor about how to prevent STIs and which forms of birth control to use. Keep all follow-up visits. Contact a doctor if: You have a fever. You get more redness, swelling, or pain around your cyst. You have fluid, blood, pus, or a bad smell coming from your cyst. You have a cyst that gets larger or a cyst that comes back. Summary A Bartholin's cyst is a fluid-filled sac that forms on a Bartholin's gland. These small glands are found in the folds of skin near the opening of the vagina (labia). This type of cyst causes a bulge or lump near the  opening of the vagina. Try sitz baths a few times a day to help with pain and swelling. Do not push on or squeeze your cyst. This information is not intended to replace advice given to you by your health care provider. Make sure you discuss any questions you have with your health care provider. Document Revised: 03/15/2020 Document Reviewed: 03/15/2020 Elsevier Patient Education  2024 [employer].

## 2023-08-08 ENCOUNTER — Other Ambulatory Visit: Payer: Self-pay | Admitting: Nurse Practitioner

## 2023-08-08 ENCOUNTER — Telehealth: Payer: Self-pay

## 2023-08-08 ENCOUNTER — Other Ambulatory Visit (HOSPITAL_COMMUNITY): Payer: Self-pay

## 2023-08-08 LAB — NUSWAB VAGINITIS PLUS (VG+)
Atopobium vaginae: HIGH {score} — AB
BVAB 2: HIGH {score} — AB
Candida albicans, NAA: NEGATIVE
Candida glabrata, NAA: NEGATIVE
Megasphaera 1: HIGH {score} — AB

## 2023-08-08 MED ORDER — METRONIDAZOLE 500 MG PO TABS
500.0000 mg | ORAL_TABLET | Freq: Two times a day (BID) | ORAL | 0 refills | Status: AC
  Filled 2023-08-08: qty 14, 7d supply, fill #0

## 2023-08-08 NOTE — Telephone Encounter (Signed)
-----   Message from Ivonne Andrew sent at 08/08/2023  8:06 AM EDT ----- Vaginal swab came back positive for BV. Flagyl has been sent to the pharmacy.

## 2023-08-08 NOTE — Telephone Encounter (Signed)
Patient aware of results and recommendations. °

## 2023-08-10 ENCOUNTER — Ambulatory Visit: Payer: BLUE CROSS/BLUE SHIELD | Admitting: Orthopaedic Surgery

## 2023-08-20 ENCOUNTER — Other Ambulatory Visit: Payer: Self-pay

## 2023-08-22 ENCOUNTER — Other Ambulatory Visit: Payer: Self-pay

## 2023-08-22 ENCOUNTER — Encounter (HOSPITAL_COMMUNITY): Payer: Self-pay

## 2023-08-22 ENCOUNTER — Other Ambulatory Visit (HOSPITAL_COMMUNITY): Payer: Self-pay

## 2023-09-04 ENCOUNTER — Ambulatory Visit (HOSPITAL_COMMUNITY): Payer: BLUE CROSS/BLUE SHIELD

## 2023-09-04 ENCOUNTER — Other Ambulatory Visit (HOSPITAL_COMMUNITY): Payer: Self-pay

## 2023-09-04 ENCOUNTER — Encounter (HOSPITAL_COMMUNITY): Payer: Self-pay

## 2023-09-04 ENCOUNTER — Ambulatory Visit (HOSPITAL_COMMUNITY)
Admission: EM | Admit: 2023-09-04 | Discharge: 2023-09-04 | Disposition: A | Discharge status: Home / Self Care | Payer: BLUE CROSS/BLUE SHIELD | Attending: Family Medicine | Admitting: Family Medicine

## 2023-09-04 DIAGNOSIS — J208 Acute bronchitis due to other specified organisms: Secondary | ICD-10-CM

## 2023-09-04 DIAGNOSIS — J209 Acute bronchitis, unspecified: Secondary | ICD-10-CM | POA: Diagnosis not present

## 2023-09-04 LAB — POC COVID19/FLU A&B COMBO
Covid Antigen, POC: NEGATIVE
Influenza A Antigen, POC: NEGATIVE
Influenza B Antigen, POC: NEGATIVE

## 2023-09-04 MED ORDER — PREDNISONE 20 MG PO TABS
40.0000 mg | ORAL_TABLET | Freq: Every day | ORAL | 0 refills | Status: AC
  Filled 2023-09-04 (×2): qty 10, 5d supply, fill #0

## 2023-09-04 MED ORDER — BENZONATATE 200 MG PO CAPS
200.0000 mg | ORAL_CAPSULE | Freq: Three times a day (TID) | ORAL | 0 refills | Status: DC | PRN
  Filled 2023-09-04: qty 21, 7d supply, fill #0

## 2023-09-04 MED ORDER — ALBUTEROL SULFATE HFA 108 (90 BASE) MCG/ACT IN AERS
2.0000 | INHALATION_SPRAY | RESPIRATORY_TRACT | 0 refills | Status: DC | PRN
  Filled 2023-09-04: qty 6.7, 30d supply, fill #0

## 2023-09-04 MED ORDER — GUAIFENESIN-CODEINE 100-10 MG/5ML PO SYRP
5.0000 mL | ORAL_SOLUTION | Freq: Three times a day (TID) | ORAL | 0 refills | Status: AC | PRN
  Filled 2023-09-04 (×2): qty 120, 8d supply, fill #0

## 2023-09-04 NOTE — ED Provider Notes (Addendum)
[facility]    CSN: [id number] Arrival date & time: 09/04/23  1019      History   Chief Complaint Chief Complaint  Patient presents with   Cough    HPI Ariana Rogers is a 37 y.o. female.    Cough Associated symptoms: chills, myalgias and sore throat   Associated symptoms: no fever and no wheezing    Patient is here for Uri symptoms since yesterday.  Having a cough, chest congestion, lost voice.  Mouth and nose feels dry, no drainage, congestion per se.  Feels cold, no fevers.  Chest is painful, no other body aches.  No n/v.  No known sick contacts. No home covid testing.  H/o bronchitis, no asthma.  She does smokes cigars daily          Past Medical History:  Diagnosis Date   Acute bronchitis    Cough 03/2019   GERD (gastroesophageal reflux disease) 03/2019   Left breast mass    Smoker    Vitamin D deficiency 09/2020    Patient Active Problem List   Diagnosis Date Noted   Insomnia 07/26/2023   Snoring 04/30/2023   Acne 08/09/2022   Visit for gynecologic examination 02/17/2022   Encounter for screening breast examination 02/17/2022   Gastroesophageal reflux disease without esophagitis 04/21/2019   Cough 04/21/2019   Acute bronchitis    Breast mass, left 01/28/2019   Painful lumpy left breast 01/28/2019   Maculopapular rash, generalized 12/30/2018   Pityriasis rosea 12/30/2018   Morbid obesity with BMI of 45.0-49.9, adult (HCC) 12/30/2018   Itching 12/30/2018   Leukocytosis 01/17/2018    Past Surgical History:  Procedure Laterality Date   DENTAL SURGERY     TUBAL LIGATION  2 years ago    OB History     Gravida  4   Para      Term      Preterm      AB      Living  4      SAB      IAB      Ectopic      Multiple      Live Births  4            Home Medications    Prior to Admission medications   Medication Sig Start Date End Date Taking? Authorizing Provider  amLODipine (NORVASC) 5 MG tablet Take 1  tablet (5 mg total) by mouth daily. 08/02/23   Ivonne Andrew, NP  Blood Glucose Monitoring Suppl (BLOOD GLUCOSE MONITOR SYSTEM) w/Device KIT Use up to 4 (four) times daily as directed. 08/17/21   Barbette Merino, NP  cephALEXin (KEFLEX) 500 MG capsule Take 1 capsule (500 mg total) by mouth 3 (three) times daily. 08/02/23   Ivonne Andrew, NP  cetirizine (ZYRTEC) 10 MG chewable tablet Chew 10 mg by mouth daily as needed.    [provider]  clindamycin (CLINDAGEL) 1 % gel Apply topically 2 (two) times daily. 08/02/23   Ivonne Andrew, NP  fluticasone (FLONASE) 50 MCG/ACT nasal spray Place 2 sprays into both nostrils daily. 08/30/22   Ivonne Andrew, NP  glucose blood test strip Use up to 4 (four) times daily as directed. 08/17/21   Barbette Merino, NP  hydrOXYzine (ATARAX) 10 MG tablet Take 1 tablet (10 mg total) by mouth 3 (three) times daily as needed. 04/30/23   Ivonne Andrew, NP  Lancets (FREESTYLE) lancets Use up to 4 (four) times  daily as directed. 08/17/21   Barbette Merino, NP  Semaglutide,0.25 or 0.5MG /DOS, 2 MG/3ML SOPN Inject 0.25 mg into the skin once a week. Patient not taking: Reported on 07/25/2023 04/30/23   Ivonne Andrew, NP  Vitamin D, Ergocalciferol, (DRISDOL) 1.25 MG (50000 UNIT) CAPS capsule Take 1 capsule (50,000 Units total) by mouth every 7 (seven) days. 05/02/23   Ivonne Andrew, NP    Family History Family History  Problem Relation Age of Onset   Hypertension Mother    Arthritis Mother    Fibromyalgia Mother    Other Father        no health concern    Social History Social History   Tobacco Use   Smoking status: Every Day    Current packs/day: 0.00    Types: Cigarettes, Cigars    Last attempt to quit: 03/30/2021    Years since quitting: 2.4   Smokeless tobacco: Never   Tobacco comments:    Black and mild's  2 per day for >10 years.  HFB 07/25/2023  Vaping Use   Vaping status: Never Used  Substance Use Topics   Alcohol use: Yes    Comment:  occasionally   Drug use: Yes    Types: Marijuana    Comment: edibles     Allergies   Patient has no known allergies.   Review of Systems Review of Systems  Constitutional:  Positive for chills and fatigue. Negative for fever.  HENT:  Positive for sore throat and voice change.   Respiratory:  Positive for cough. Negative for wheezing.   Cardiovascular: Negative.   Gastrointestinal: Negative.   Genitourinary: Negative.   Musculoskeletal:  Positive for myalgias.  Psychiatric/Behavioral: Negative.       Physical Exam Triage Vital Signs ED Triage Vitals  Encounter Vitals Group     BP 09/04/23 1115 126/60     Systolic BP Percentile --      Diastolic BP Percentile --      Pulse Rate 09/04/23 1115 79     Resp 09/04/23 1115 16     Temp 09/04/23 1115 98 F (36.7 C)     Temp Source 09/04/23 1115 Oral     SpO2 09/04/23 1115 95 %     Weight --      Height --      Head Circumference --      Peak Flow --      Pain Score 09/04/23 1116 0     Pain Loc --      Pain Education --      Exclude from Growth Chart --    No data found.  Updated Vital Signs BP 126/60 (BP Location: Left Arm)   Pulse 79   Temp 98 F (36.7 C) (Oral)   Resp 16   LMP 08/08/2023 (Approximate)   SpO2 95%   Visual Acuity Right Eye Distance:   Left Eye Distance:   Bilateral Distance:    Right Eye Near:   Left Eye Near:    Bilateral Near:     Physical Exam Constitutional:      General: She is not in acute distress.    Appearance: Normal appearance. She is ill-appearing.  HENT:     Nose: Nose normal.     Mouth/Throat:     Mouth: Mucous membranes are moist.     Pharynx: Posterior oropharyngeal erythema present. No oropharyngeal exudate.  Cardiovascular:     Rate and Rhythm: Normal rate and regular rhythm.  Pulmonary:  Effort: Pulmonary effort is normal.     Breath sounds: Normal breath sounds.  Musculoskeletal:     Cervical back: Normal range of motion and neck supple. Tenderness present.   Skin:    General: Skin is warm.  Neurological:     General: No focal deficit present.     Mental Status: She is alert.  Psychiatric:        Mood and Affect: Mood normal.      UC Treatments / Results  Labs (all labs ordered are listed, but only abnormal results are displayed) Labs Reviewed  POC COVID19/FLU A&B COMBO   Flu/covid negative  EKG   Radiology No results found.  Procedures Procedures (including critical care time)  Medications Ordered in UC Medications - No data to display  Initial Impression / Assessment and Plan / UC Course  I have reviewed the triage vital signs and the nursing notes.  Pertinent labs & imaging results that were available during my care of the patient were reviewed by me and considered in my medical decision making (see chart for details).  Patient has requested cough syrup with codeine as this has been helpful in the past.  Tessalon does not work for her.  Medications adjusted  as requested.   Final Clinical Impressions(s) / UC Diagnoses   Final diagnoses:  Acute bronchitis, unspecified organism     Discharge Instructions      You were seen today for upper respiratory symptoms.  Your flu and covid test were negative today.  Your xray appears to show inflammation consistent with bronchitis.  I have sent out a steroid, inhaler, and cough medication for you today.  If the radiologist sees something different we will call to notify you.  In the mean time I recommend you get plenty of rest and fluids.  Please return if not improving or worsening.     ED Prescriptions     Medication Sig Dispense Auth. Provider   benzonatate (TESSALON) 200 MG capsule  (Status: Discontinued) Take 1 capsule (200 mg total) by mouth 3 (three) times daily as needed for cough. 21 capsule Eros Montour, MD   albuterol (VENTOLIN HFA) 108 (90 Base) MCG/ACT inhaler Inhale 2 puffs into the lungs every 4 (four) hours as needed for wheezing or shortness of  breath. 1 each Zeriah Baysinger, MD   predniSONE (DELTASONE) 20 MG tablet Take 2 tablets (40 mg total) by mouth daily for 5 days. 10 tablet Rabab Currington, MD   guaiFENesin-codeine (ROBITUSSIN AC) 100-10 MG/5ML syrup Take 5 mLs by mouth 3 (three) times daily as needed for cough. 120 mL Jannifer Franklin, MD      PDMP not reviewed this encounter.   Jannifer Franklin, MD 09/04/23 1159    Jannifer Franklin, MD 09/04/23 1205    Jannifer Franklin, MD 09/04/23 1248

## 2023-09-04 NOTE — Discharge Instructions (Addendum)
You were seen today for upper respiratory symptoms.  Your flu and covid test were negative today.  Your xray appears to show inflammation consistent with bronchitis.  I have sent out a steroid, inhaler, and cough medication for you today.  If the radiologist sees something different we will call to notify you.  In the mean time I recommend you get plenty of rest and fluids.  Please return if not improving or worsening.

## 2023-09-04 NOTE — ED Triage Notes (Signed)
Pt states cough,chest congestion and laryngitis since yesterday.

## 2023-09-11 ENCOUNTER — Encounter: Payer: Self-pay | Admitting: Adult Health

## 2023-09-11 ENCOUNTER — Ambulatory Visit: Payer: BLUE CROSS/BLUE SHIELD | Admitting: Adult Health

## 2023-10-04 ENCOUNTER — Ambulatory Visit (HOSPITAL_COMMUNITY)
Admission: EM | Admit: 2023-10-04 | Discharge: 2023-10-04 | Disposition: A | Discharge status: Home / Self Care | Payer: BLUE CROSS/BLUE SHIELD

## 2023-10-04 ENCOUNTER — Other Ambulatory Visit: Payer: Self-pay

## 2023-10-04 ENCOUNTER — Encounter (HOSPITAL_COMMUNITY): Payer: Self-pay

## 2023-10-04 ENCOUNTER — Telehealth: Payer: Self-pay | Admitting: Nurse Practitioner

## 2023-10-04 ENCOUNTER — Emergency Department (HOSPITAL_COMMUNITY)
Admission: EM | Admit: 2023-10-04 | Discharge: 2023-10-04 | Discharge status: Against Medical Advice | Payer: BLUE CROSS/BLUE SHIELD | Attending: Emergency Medicine | Admitting: Emergency Medicine

## 2023-10-04 ENCOUNTER — Encounter (HOSPITAL_COMMUNITY): Payer: Self-pay | Admitting: Emergency Medicine

## 2023-10-04 DIAGNOSIS — R519 Headache, unspecified: Secondary | ICD-10-CM | POA: Insufficient documentation

## 2023-10-04 DIAGNOSIS — Z5321 Procedure and treatment not carried out due to patient leaving prior to being seen by health care provider: Secondary | ICD-10-CM | POA: Insufficient documentation

## 2023-10-04 NOTE — Discharge Instructions (Signed)
Please go to the emergency department as I feel that you need a CT scan of your head.

## 2023-10-04 NOTE — ED Notes (Signed)
Turned lights out in treatment room for patient comfort

## 2023-10-04 NOTE — ED Triage Notes (Signed)
Pt is coming in for headache that stared yesterday, was seen at [facility] for the same and they sent her here for a CT san of her head. She has no neurological symptoms at this time and no neuro deficits only sensitivity to light. Pt is otherwise stable at this time.

## 2023-10-04 NOTE — ED Notes (Signed)
Patient is being discharged from the Urgent Care and sent to the Emergency Department via POV . Per Ervin Knack, NP, patient is in need of higher level of care due to limited resources and c/o significant headache. Patient is aware and verbalizes understanding of plan of care.  Vitals:   10/04/23 1203  BP: 120/85  Pulse: 78  Resp: 18  Temp: 98.9 F (37.2 C)  SpO2: 97%

## 2023-10-04 NOTE — ED Provider Notes (Signed)
[facility]    CSN: [id number] Arrival date & time: 10/04/23  1029      History   Chief Complaint Chief Complaint  Patient presents with   Headache    HPI Ariana Rogers is a 37 y.o. female.   Patient presents with severe headache on the left side of her head that started yesterday.  She states it is constant and rates it as a 9/10 on pain scale.  States that her vision on the left side has been "abnormal".  Denies any associated dizziness, nausea, vomiting.  Pain seems to radiate down into her neck.  She denies fever, chills, upper respiratory symptoms.  She took ibuprofen and Tylenol yesterday with no improvement.  Denies history of migraines.  Denies any recent falls or head injuries.  States that their head seems sore to the touch.   Headache   Past Medical History:  Diagnosis Date   Acute bronchitis    Cough 03/2019   GERD (gastroesophageal reflux disease) 03/2019   Left breast mass    Smoker    Vitamin D deficiency 09/2020    Patient Active Problem List   Diagnosis Date Noted   Insomnia 07/26/2023   Snoring 04/30/2023   Acne 08/09/2022   Visit for gynecologic examination 02/17/2022   Encounter for screening breast examination 02/17/2022   Gastroesophageal reflux disease without esophagitis 04/21/2019   Cough 04/21/2019   Acute bronchitis    Breast mass, left 01/28/2019   Painful lumpy left breast 01/28/2019   Maculopapular rash, generalized 12/30/2018   Pityriasis rosea 12/30/2018   Morbid obesity with BMI of 45.0-49.9, adult (HCC) 12/30/2018   Itching 12/30/2018   Leukocytosis 01/17/2018    Past Surgical History:  Procedure Laterality Date   DENTAL SURGERY     TUBAL LIGATION  2 years ago    OB History     Gravida  4   Para      Term      Preterm      AB      Living  4      SAB      IAB      Ectopic      Multiple      Live Births  4            Home Medications    Prior to Admission medications   Medication  Sig Start Date End Date Taking? Authorizing Provider  albuterol (VENTOLIN HFA) 108 (90 Base) MCG/ACT inhaler Inhale 2 puffs into the lungs every 4 (four) hours as needed for wheezing or shortness of breath. 09/04/23   Piontek, Denny Peon, MD  amLODipine (NORVASC) 5 MG tablet Take 1 tablet (5 mg total) by mouth daily. 08/02/23   Ivonne Andrew, NP  Blood Glucose Monitoring Suppl (BLOOD GLUCOSE MONITOR SYSTEM) w/Device KIT Use up to 4 (four) times daily as directed. 08/17/21   Barbette Merino, NP  cephALEXin (KEFLEX) 500 MG capsule Take 1 capsule (500 mg total) by mouth 3 (three) times daily. Patient not taking: Reported on 10/04/2023 08/02/23   Ivonne Andrew, NP  cetirizine (ZYRTEC) 10 MG chewable tablet Chew 10 mg by mouth daily as needed. Patient not taking: Reported on 10/04/2023    [provider]  clindamycin (CLINDAGEL) 1 % gel Apply topically 2 (two) times daily. 08/02/23   Ivonne Andrew, NP  fluticasone (FLONASE) 50 MCG/ACT nasal spray Place 2 sprays into both nostrils daily. Patient not taking: Reported on 10/04/2023 08/30/22  Ivonne Andrew, NP  glucose blood test strip Use up to 4 (four) times daily as directed. 08/17/21   Barbette Merino, NP  guaiFENesin-codeine (ROBITUSSIN AC) 100-10 MG/5ML syrup Take 5 mLs by mouth 3 (three) times daily as needed for cough. Patient not taking: Reported on 10/04/2023 09/04/23   Jannifer Franklin, MD  hydrOXYzine (ATARAX) 10 MG tablet Take 1 tablet (10 mg total) by mouth 3 (three) times daily as needed. 04/30/23   Ivonne Andrew, NP  Lancets (FREESTYLE) lancets Use up to 4 (four) times daily as directed. 08/17/21   Barbette Merino, NP  Semaglutide,0.25 or 0.5MG /DOS, 2 MG/3ML SOPN Inject 0.25 mg into the skin once a week. Patient not taking: Reported on 07/25/2023 04/30/23   Ivonne Andrew, NP  Vitamin D, Ergocalciferol, (DRISDOL) 1.25 MG (50000 UNIT) CAPS capsule Take 1 capsule (50,000 Units total) by mouth every 7 (seven) days. Patient not taking:  Reported on 10/04/2023 05/02/23   Ivonne Andrew, NP    Family History Family History  Problem Relation Age of Onset   Hypertension Mother    Arthritis Mother    Fibromyalgia Mother    Other Father        no health concern    Social History Social History   Tobacco Use   Smoking status: Former    Current packs/day: 0.00    Types: Cigarettes, Cigars    Quit date: 03/30/2021    Years since quitting: 2.5   Smokeless tobacco: Never   Tobacco comments:    Black and mild's  2 per day for >10 years.  HFB 07/25/2023  Vaping Use   Vaping status: Never Used  Substance Use Topics   Alcohol use: Yes    Comment: occasionally   Drug use: Yes    Types: Marijuana    Comment: edibles     Allergies   Patient has no known allergies.   Review of Systems Review of Systems Per HPI  Physical Exam Triage Vital Signs ED Triage Vitals  Encounter Vitals Group     BP 10/04/23 1203 120/85     Systolic BP Percentile --      Diastolic BP Percentile --      Pulse Rate 10/04/23 1203 78     Resp 10/04/23 1203 18     Temp 10/04/23 1203 98.9 F (37.2 C)     Temp Source 10/04/23 1203 Oral     SpO2 10/04/23 1203 97 %     Weight --      Height --      Head Circumference --      Peak Flow --      Pain Score 10/04/23 1200 9     Pain Loc --      Pain Education --      Exclude from Growth Chart --    No data found.  Updated Vital Signs BP 120/85 (BP Location: Right Arm) Comment (BP Location): large cuff  Pulse 78   Temp 98.9 F (37.2 C) (Oral)   Resp 18   LMP 09/04/2023 (Approximate)   SpO2 97%   Visual Acuity Right Eye Distance:   Left Eye Distance:   Bilateral Distance:    Right Eye Near:   Left Eye Near:    Bilateral Near:     Physical Exam Constitutional:      General: She is not in acute distress.    Appearance: Normal appearance. She is not toxic-appearing or diaphoretic.  HENT:  Head: Normocephalic and atraumatic.     Comments: No obvious abnormality noted to  scalp. Eyes:     Extraocular Movements: Extraocular movements intact.     Conjunctiva/sclera: Conjunctivae normal.     Pupils: Pupils are equal, round, and reactive to light.  Pulmonary:     Effort: Pulmonary effort is normal.  Neurological:     General: No focal deficit present.     Mental Status: She is alert and oriented to person, place, and time. Mental status is at baseline.     Cranial Nerves: Cranial nerves 2-12 are intact.     Sensory: Sensation is intact.     Motor: Motor function is intact.     Coordination: Coordination is intact.     Gait: Gait is intact.  Psychiatric:        Mood and Affect: Mood normal.        Behavior: Behavior normal.        Thought Content: Thought content normal.        Judgment: Judgment normal.      UC Treatments / Results  Labs (all labs ordered are listed, but only abnormal results are displayed) Labs Reviewed - No data to display  EKG   Radiology No results found.  Procedures Procedures (including critical care time)  Medications Ordered in UC Medications - No data to display  Initial Impression / Assessment and Plan / UC Course  I have reviewed the triage vital signs and the nursing notes.  Pertinent labs & imaging results that were available during my care of the patient were reviewed by me and considered in my medical decision making (see chart for details).     I suspect patient could simply have migraine headache but given that she does not have a history of migraine headaches and headache is severe, I do think that CT imaging would be reasonable which cannot be provided here at urgent care.  Therefore, patient was advised to go to the ER for further evaluation and management and she was agreeable with plan.  Vital signs and neuroexam stable at discharge.  Agree with patient self transport to the ER. Final Clinical Impressions(s) / UC Diagnoses   Final diagnoses:  Severe headache     Discharge Instructions       Please go to the emergency department as I feel that you need a CT scan of your head.    ED Prescriptions   None    PDMP not reviewed this encounter.   Gustavus Bryant, [place] 10/04/23 1252

## 2023-10-04 NOTE — Telephone Encounter (Signed)
Copied from CRM [id number]. Topic: Clinical - Request for Lab/Test Order >> Oct 04, 2023  2:39 PM Chantha C wrote: Reason for CRM: Pt states went to urgent care this morning and pt is to have a ct scan of the head. Pt is at ER now and the wait time is long, pt is asking if her provider can order CT scan so she can be see sooner. Pls advise and c/b [phone]. Advised pt, per clinic office. It's up to pt to leave ER today, CT order has to be submitted to provider and scheduling will contact pt which will not be done today and can take a few days. Pt verified understanding and wants provider to order a CT scan of head.

## 2023-10-04 NOTE — ED Notes (Signed)
Pt wanted to be taken out, seen leaving ED.

## 2023-10-04 NOTE — ED Triage Notes (Signed)
Reports headache started yesterday.  States pain is left side of head and neck.  Reports scalp is sore to touch.  Denies any new uri symptoms.    Took 2 ibuprofen yesterday.

## 2023-10-13 ENCOUNTER — Other Ambulatory Visit: Payer: Self-pay | Admitting: Nurse Practitioner

## 2023-10-13 DIAGNOSIS — L0291 Cutaneous abscess, unspecified: Secondary | ICD-10-CM

## 2023-10-15 ENCOUNTER — Other Ambulatory Visit (HOSPITAL_COMMUNITY): Payer: Self-pay

## 2023-10-15 ENCOUNTER — Other Ambulatory Visit: Payer: Self-pay

## 2023-10-15 MED ORDER — CLINDAMYCIN PHOSPHATE 1 % EX GEL
Freq: Two times a day (BID) | CUTANEOUS | 0 refills | Status: AC
  Filled 2023-10-15: qty 30, 30d supply, fill #0

## 2023-11-09 ENCOUNTER — Other Ambulatory Visit: Payer: Self-pay

## 2023-11-09 ENCOUNTER — Other Ambulatory Visit (HOSPITAL_COMMUNITY): Payer: Self-pay

## 2023-11-09 ENCOUNTER — Other Ambulatory Visit: Payer: Self-pay | Admitting: Nurse Practitioner

## 2023-11-09 DIAGNOSIS — I10 Essential (primary) hypertension: Secondary | ICD-10-CM

## 2023-11-09 MED ORDER — VITAMIN D (ERGOCALCIFEROL) 1.25 MG (50000 UNIT) PO CAPS
50000.0000 [IU] | ORAL_CAPSULE | ORAL | 2 refills | Status: DC
  Filled 2023-11-09 – 2023-12-09 (×3): qty 5, 35d supply, fill #0

## 2023-11-09 MED ORDER — AMLODIPINE BESYLATE 5 MG PO TABS
5.0000 mg | ORAL_TABLET | Freq: Every day | ORAL | 0 refills | Status: DC
  Filled 2023-11-09: qty 90, 90d supply, fill #0

## 2023-11-20 ENCOUNTER — Other Ambulatory Visit: Payer: Self-pay | Admitting: Nurse Practitioner

## 2023-11-20 DIAGNOSIS — L0291 Cutaneous abscess, unspecified: Secondary | ICD-10-CM

## 2023-11-21 ENCOUNTER — Other Ambulatory Visit: Payer: Self-pay

## 2023-11-21 ENCOUNTER — Other Ambulatory Visit (HOSPITAL_COMMUNITY): Payer: Self-pay

## 2023-11-21 MED ORDER — CEPHALEXIN 500 MG PO CAPS
500.0000 mg | ORAL_CAPSULE | Freq: Three times a day (TID) | ORAL | 0 refills | Status: DC
  Filled 2023-11-21 – 2023-12-09 (×2): qty 30, 10d supply, fill #0

## 2023-12-03 ENCOUNTER — Other Ambulatory Visit (HOSPITAL_COMMUNITY): Payer: Self-pay

## 2023-12-09 ENCOUNTER — Other Ambulatory Visit: Payer: Self-pay

## 2023-12-10 ENCOUNTER — Other Ambulatory Visit (HOSPITAL_COMMUNITY): Payer: Self-pay

## 2023-12-10 ENCOUNTER — Other Ambulatory Visit: Payer: Self-pay

## 2023-12-17 ENCOUNTER — Other Ambulatory Visit (HOSPITAL_COMMUNITY): Payer: Self-pay

## 2024-01-31 ENCOUNTER — Other Ambulatory Visit (HOSPITAL_COMMUNITY): Payer: Self-pay

## 2024-01-31 ENCOUNTER — Encounter: Payer: Self-pay | Admitting: Nurse Practitioner

## 2024-01-31 ENCOUNTER — Ambulatory Visit (INDEPENDENT_AMBULATORY_CARE_PROVIDER_SITE_OTHER): Payer: Self-pay | Admitting: Nurse Practitioner

## 2024-01-31 VITALS — BP 127/86 | HR 87 | Temp 97.9°F | Wt 286.4 lb

## 2024-01-31 DIAGNOSIS — J40 Bronchitis, not specified as acute or chronic: Secondary | ICD-10-CM | POA: Diagnosis not present

## 2024-01-31 DIAGNOSIS — I10 Essential (primary) hypertension: Secondary | ICD-10-CM

## 2024-01-31 MED ORDER — SEMAGLUTIDE(0.25 OR 0.5MG/DOS) 2 MG/3ML ~~LOC~~ SOPN
0.5000 mg | PEN_INJECTOR | SUBCUTANEOUS | 2 refills | Status: AC
  Filled 2024-01-31 – 2024-03-11 (×3): qty 3, 28d supply, fill #0
  Filled 2024-04-21 – 2024-08-13 (×4): qty 3, 28d supply, fill #1
  Filled 2024-09-16: qty 3, 28d supply, fill #2

## 2024-01-31 MED ORDER — METHYLPREDNISOLONE SODIUM SUCC 40 MG IJ SOLR
40.0000 mg | Freq: Once | INTRAMUSCULAR | Status: AC
  Administered 2024-01-31: 40 mg via INTRAMUSCULAR

## 2024-01-31 MED ORDER — VITAMIN D (ERGOCALCIFEROL) 1.25 MG (50000 UNIT) PO CAPS
50000.0000 [IU] | ORAL_CAPSULE | ORAL | 2 refills | Status: DC
  Filled 2024-01-31: qty 5, 35d supply, fill #0
  Filled 2024-03-02: qty 10, 5d supply, fill #1

## 2024-01-31 MED ORDER — PROMETHAZINE-DM 6.25-15 MG/5ML PO SYRP
5.0000 mL | ORAL_SOLUTION | Freq: Four times a day (QID) | ORAL | 0 refills | Status: DC | PRN
  Filled 2024-01-31: qty 118, 6d supply, fill #0

## 2024-01-31 MED ORDER — AMLODIPINE BESYLATE 5 MG PO TABS
5.0000 mg | ORAL_TABLET | Freq: Every day | ORAL | 0 refills | Status: DC
  Filled 2024-01-31: qty 90, 90d supply, fill #0

## 2024-01-31 MED ORDER — AMOXICILLIN-POT CLAVULANATE 875-125 MG PO TABS
1.0000 | ORAL_TABLET | Freq: Two times a day (BID) | ORAL | 0 refills | Status: DC
  Filled 2024-01-31: qty 20, 10d supply, fill #0

## 2024-01-31 NOTE — Progress Notes (Signed)
 Subjective   Patient ID: Ariana Rogers, female    DOB: 1985-12-05, 38 y.o.   MRN: [id number]  Chief Complaint  Patient presents with   Cough    Referring provider: Ivonne Andrew, NP  Ariana Rogers is a 38 y.o. female with Past Medical History: No date: Acute bronchitis 03/2019: Cough 03/2019: GERD (gastroesophageal reflux disease) No date: Left breast mass No date: Smoker 09/2020: Vitamin D deficiency   HPI  Patient presents today for routine follow-up.  She does need refills today.  She is also sick today.  She feels like over the past few days her bronchitis is flared up.  She is having chronic cough but also states that she is having deep chest congestion and is coughing up thick mucus.  We will give Solu-Medrol in office today we will order Augmentin and cough syrup. Denies f/c/s, n/v/d, hemoptysis, PND, leg swelling Denies chest pain or edema    No Known Allergies  Immunization History  Administered Date(s) Administered   Influenza,inj,Quad PF,6+ Mos 01/15/2018, 09/27/2020, 08/17/2021   PFIZER(Purple Top)SARS-COV-2 Vaccination 06/07/2020, 07/01/2020   PPD Test 03/26/2018   Pneumococcal Polysaccharide-23 08/17/2021   Tdap 01/15/2018    Tobacco History: Social History   Tobacco Use  Smoking Status Former   Current packs/day: 0.00   Types: Cigarettes, Cigars   Quit date: 03/30/2021   Years since quitting: 2.8  Smokeless Tobacco Never  Tobacco Comments   Black and mild's  2 per day for >10 years.  HFB 07/25/2023   Counseling given: Not Answered Tobacco comments: Black and mild's  2 per day for >10 years.  HFB 07/25/2023   Outpatient Encounter Medications as of 01/31/2024  Medication Sig   albuterol (VENTOLIN HFA) 108 (90 Base) MCG/ACT inhaler Inhale 2 puffs into the lungs every 4 (four) hours as needed for wheezing or shortness of breath.   amoxicillin-clavulanate (AUGMENTIN) 875-125 MG tablet Take 1 tablet by mouth 2 (two) times daily.   Blood Glucose  Monitoring Suppl (BLOOD GLUCOSE MONITOR SYSTEM) w/Device KIT Use up to 4 (four) times daily as directed.   cephALEXin (KEFLEX) 500 MG capsule Take 1 capsule (500 mg total) by mouth 3 (three) times daily.   clindamycin (CLINDAGEL) 1 % gel Apply topically 2 (two) times daily.   glucose blood test strip Use up to 4 (four) times daily as directed.   hydrOXYzine (ATARAX) 10 MG tablet Take 1 tablet (10 mg total) by mouth 3 (three) times daily as needed.   Lancets (FREESTYLE) lancets Use up to 4 (four) times daily as directed.   promethazine-dextromethorphan (PROMETHAZINE-DM) 6.25-15 MG/5ML syrup Take 5 mLs by mouth 4 (four) times daily as needed for cough.   Semaglutide,0.25 or 0.5MG /DOS, 2 MG/3ML SOPN Inject 0.5 mg into the skin once a week.   [DISCONTINUED] amLODipine (NORVASC) 5 MG tablet Take 1 tablet (5 mg total) by mouth daily.   [DISCONTINUED] Vitamin D, Ergocalciferol, (DRISDOL) 1.25 MG (50000 UNIT) CAPS capsule Take 1 capsule (50,000 Units total) by mouth every 7 (seven) days.   amLODipine (NORVASC) 5 MG tablet Take 1 tablet (5 mg total) by mouth daily.   cetirizine (ZYRTEC) 10 MG chewable tablet Chew 10 mg by mouth daily as needed. (Patient not taking: Reported on 10/04/2023)   fluticasone (FLONASE) 50 MCG/ACT nasal spray Place 2 sprays into both nostrils daily. (Patient not taking: Reported on 01/31/2024)   guaiFENesin-codeine (ROBITUSSIN AC) 100-10 MG/5ML syrup Take 5 mLs by mouth 3 (three) times daily as needed for cough. (Patient not  taking: Reported on 01/31/2024)   Vitamin D, Ergocalciferol, (DRISDOL) 1.25 MG (50000 UNIT) CAPS capsule Take 1 capsule (50,000 Units total) by mouth every 7 (seven) days.   [DISCONTINUED] Semaglutide,0.25 or 0.5MG /DOS, 2 MG/3ML SOPN Inject 0.25 mg into the skin once a week. (Patient not taking: Reported on 07/25/2023)   Facility-Administered Encounter Medications as of 01/31/2024  Medication   methylPREDNISolone sodium succinate (SOLU-MEDROL) 40 mg/mL injection 40 mg     Review of Systems  Review of Systems  Constitutional: Negative.   HENT:  Positive for congestion.   Respiratory:  Positive for cough and wheezing.   Cardiovascular: Negative.   Gastrointestinal: Negative.   Allergic/Immunologic: Negative.   Neurological: Negative.   Psychiatric/Behavioral: Negative.       Objective:   BP 127/86   Pulse 87   Temp 97.9 F (36.6 C) (Oral)   Wt 286 lb 6.4 oz (129.9 kg)   SpO2 99%   BMI 47.11 kg/m   Wt Readings from Last 5 Encounters:  01/31/24 286 lb 6.4 oz (129.9 kg)  08/02/23 289 lb (131.1 kg)  07/25/23 283 lb 9.6 oz (128.6 kg)  04/30/23 287 lb (130.2 kg)  01/22/23 280 lb 6.4 oz (127.2 kg)     Physical Exam Vitals and nursing note reviewed.  Constitutional:      General: She is not in acute distress.    Appearance: She is well-developed.  Cardiovascular:     Rate and Rhythm: Normal rate and regular rhythm.  Pulmonary:     Effort: Pulmonary effort is normal.     Breath sounds: Wheezing present.  Neurological:     Mental Status: She is alert and oriented to person, place, and time.       Assessment & Plan:   Primary hypertension -     CBC -     Comprehensive metabolic panel with GFR -     amLODIPine Besylate; Take 1 tablet (5 mg total) by mouth daily.  Dispense: 90 tablet; Refill: 0  Bronchitis -     Amoxicillin-Pot Clavulanate; Take 1 tablet by mouth 2 (two) times daily.  Dispense: 20 tablet; Refill: 0 -     Promethazine-DM; Take 5 mLs by mouth 4 (four) times daily as needed for cough.  Dispense: 118 mL; Refill: 0 -     methylPREDNISolone Sodium Succ  Other orders -     Vitamin D (Ergocalciferol); Take 1 capsule (50,000 Units total) by mouth every 7 (seven) days.  Dispense: 5 capsule; Refill: 2 -     Semaglutide(0.25 or 0.5MG /DOS); Inject 0.5 mg into the skin once a week.  Dispense: 3 mL; Refill: 2     Return in about 6 months (around 08/01/2024).   Ivonne Andrew, NP 01/31/2024

## 2024-01-31 NOTE — Patient Instructions (Signed)
 1. Primary hypertension (Primary)  - CBC - Comprehensive metabolic panel with GFR - amLODipine (NORVASC) 5 MG tablet; Take 1 tablet (5 mg total) by mouth daily.  Dispense: 90 tablet; Refill: 0  2. Bronchitis  - amoxicillin-clavulanate (AUGMENTIN) 875-125 MG tablet; Take 1 tablet by mouth 2 (two) times daily.  Dispense: 20 tablet; Refill: 0 - promethazine-dextromethorphan (PROMETHAZINE-DM) 6.25-15 MG/5ML syrup; Take 5 mLs by mouth 4 (four) times daily as needed for cough.  Dispense: 118 mL; Refill: 0 - methylPREDNISolone sodium succinate (SOLU-MEDROL) 40 mg/mL injection 40 mg

## 2024-02-01 ENCOUNTER — Other Ambulatory Visit: Payer: Self-pay

## 2024-02-01 LAB — COMPREHENSIVE METABOLIC PANEL WITH GFR
ALT: 14 IU/L (ref 0–32)
AST: 14 IU/L (ref 0–40)
Albumin: 4 g/dL (ref 3.9–4.9)
Alkaline Phosphatase: 77 IU/L (ref 44–121)
BUN/Creatinine Ratio: 21 (ref 9–23)
BUN: 16 mg/dL (ref 6–20)
Bilirubin Total: 0.2 mg/dL (ref 0.0–1.2)
CO2: 21 mmol/L (ref 20–29)
Calcium: 9.2 mg/dL (ref 8.7–10.2)
Chloride: 103 mmol/L (ref 96–106)
Creatinine, Ser: 0.78 mg/dL (ref 0.57–1.00)
Globulin, Total: 2.7 g/dL (ref 1.5–4.5)
Glucose: 153 mg/dL — ABNORMAL HIGH (ref 70–99)
Potassium: 4.4 mmol/L (ref 3.5–5.2)
Sodium: 142 mmol/L (ref 134–144)
Total Protein: 6.7 g/dL (ref 6.0–8.5)
eGFR: 100 mL/min/{1.73_m2} (ref >59)

## 2024-02-01 LAB — CBC
Hematocrit: 37.6 % (ref 34.0–46.6)
Hemoglobin: 12.1 g/dL (ref 11.1–15.9)
MCH: 24.7 pg — ABNORMAL LOW (ref 26.6–33.0)
MCHC: 32.2 g/dL (ref 31.5–35.7)
MCV: 77 fL — ABNORMAL LOW (ref 79–97)
Platelets: 370 10*3/uL (ref 150–450)
RBC: 4.89 x10E6/uL (ref 3.77–5.28)
RDW: 15.7 % — ABNORMAL HIGH (ref 11.7–15.4)
WBC: 18.9 10*3/uL — ABNORMAL HIGH (ref 3.4–10.8)

## 2024-02-04 ENCOUNTER — Ambulatory Visit: Payer: Self-pay

## 2024-02-04 ENCOUNTER — Other Ambulatory Visit: Payer: Self-pay

## 2024-03-03 ENCOUNTER — Other Ambulatory Visit (HOSPITAL_COMMUNITY): Payer: Self-pay

## 2024-03-05 ENCOUNTER — Other Ambulatory Visit: Payer: Self-pay | Admitting: Nurse Practitioner

## 2024-03-05 DIAGNOSIS — J40 Bronchitis, not specified as acute or chronic: Secondary | ICD-10-CM

## 2024-03-06 ENCOUNTER — Other Ambulatory Visit: Payer: Self-pay

## 2024-03-06 ENCOUNTER — Other Ambulatory Visit (HOSPITAL_COMMUNITY): Payer: Self-pay

## 2024-03-06 MED ORDER — PROMETHAZINE-DM 6.25-15 MG/5ML PO SYRP
5.0000 mL | ORAL_SOLUTION | Freq: Four times a day (QID) | ORAL | 0 refills | Status: DC | PRN
Start: 2024-03-06 — End: 2024-07-21
  Filled 2024-03-06: qty 118, 6d supply, fill #0

## 2024-03-07 ENCOUNTER — Other Ambulatory Visit: Payer: Self-pay

## 2024-03-11 ENCOUNTER — Other Ambulatory Visit (HOSPITAL_COMMUNITY): Payer: Self-pay

## 2024-03-25 ENCOUNTER — Encounter: Payer: Self-pay | Admitting: Nurse Practitioner

## 2024-03-25 ENCOUNTER — Ambulatory Visit (INDEPENDENT_AMBULATORY_CARE_PROVIDER_SITE_OTHER): Admitting: Nurse Practitioner

## 2024-03-25 ENCOUNTER — Other Ambulatory Visit (HOSPITAL_COMMUNITY): Payer: Self-pay

## 2024-03-25 VITALS — BP 133/89 | HR 74 | Temp 97.9°F | Wt 286.8 lb

## 2024-03-25 DIAGNOSIS — Z113 Encounter for screening for infections with a predominantly sexual mode of transmission: Secondary | ICD-10-CM | POA: Diagnosis not present

## 2024-03-25 DIAGNOSIS — L0291 Cutaneous abscess, unspecified: Secondary | ICD-10-CM

## 2024-03-25 MED ORDER — CEPHALEXIN 500 MG PO CAPS
500.0000 mg | ORAL_CAPSULE | Freq: Three times a day (TID) | ORAL | 0 refills | Status: DC
  Filled 2024-03-25 – 2024-04-21 (×2): qty 30, 10d supply, fill #0

## 2024-03-25 MED ORDER — CLINDAMYCIN PHOS (TWICE-DAILY) 1 % EX GEL
Freq: Two times a day (BID) | CUTANEOUS | 0 refills | Status: DC
  Filled 2024-03-25 – 2024-04-21 (×2): qty 30, 15d supply, fill #0

## 2024-03-25 NOTE — Patient Instructions (Signed)
 1. Screen for STD (sexually transmitted disease) (Primary)  - NuSwab Vaginitis Plus (VG+) - RPR+HIV+GC+CT Panel - Chlamydia/Gonococcus/Trichomonas, NAA

## 2024-03-25 NOTE — Progress Notes (Unsigned)
 Subjective   Patient ID: Ariana Rogers, female    DOB: 02-08-1986, 38 y.o.   MRN: [id number]  Chief Complaint  Patient presents with   Exposure to STD    Patient would like to get checked for STD    Referring provider: Jerrlyn Morel, NP  Ariana Rogers is a 38 y.o. female with Past Medical History: No date: Acute bronchitis 03/2019: Cough 03/2019: GERD (gastroesophageal reflux disease) No date: Left breast mass No date: Smoker 09/2020: Vitamin D  deficiency   HPI  Patient presents today for STD screening.  She thinks she could have been exposed to an STD and would like to be checked.  No other issues or concerns today.  Patient is not currently having symptoms. Denies f/c/s, n/v/d, hemoptysis, PND, leg swelling Denies chest pain or edema    No Known Allergies  Immunization History  Administered Date(s) Administered   Influenza,inj,Quad PF,6+ Mos 01/15/2018, 09/27/2020, 08/17/2021   PFIZER(Purple Top)SARS-COV-2 Vaccination 06/07/2020, 07/01/2020   PPD Test 03/26/2018   Pneumococcal Polysaccharide-23 08/17/2021   Tdap 01/15/2018    Tobacco History: Social History   Tobacco Use  Smoking Status Former   Current packs/day: 0.00   Types: Cigarettes, Cigars   Quit date: 03/30/2021   Years since quitting: 2.9  Smokeless Tobacco Never  Tobacco Comments   Black and mild's  2 per day for >10 years.  HFB 07/25/2023   Counseling given: Not Answered Tobacco comments: Black and mild's  2 per day for >10 years.  HFB 07/25/2023   Outpatient Encounter Medications as of 03/25/2024  Medication Sig   albuterol  (VENTOLIN  HFA) 108 (90 Base) MCG/ACT inhaler Inhale 2 puffs into the lungs every 4 (four) hours as needed for wheezing or shortness of breath.   amLODipine  (NORVASC ) 5 MG tablet Take 1 tablet (5 mg total) by mouth daily.   Blood Glucose Monitoring Suppl (BLOOD GLUCOSE MONITOR SYSTEM) w/Device KIT Use up to 4 (four) times daily as directed.   cetirizine  (ZYRTEC ) 10 MG  chewable tablet Chew 10 mg by mouth daily as needed.   clindamycin  (CLINDAGEL ) 1 % gel Apply topically 2 (two) times daily.   fluticasone  (FLONASE ) 50 MCG/ACT nasal spray Place 2 sprays into both nostrils daily.   glucose blood test strip Use up to 4 (four) times daily as directed.   guaiFENesin -codeine  (ROBITUSSIN AC) 100-10 MG/5ML syrup Take 5 mLs by mouth 3 (three) times daily as needed for cough.   hydrOXYzine  (ATARAX ) 10 MG tablet Take 1 tablet (10 mg total) by mouth 3 (three) times daily as needed.   Lancets (FREESTYLE) lancets Use up to 4 (four) times daily as directed.   promethazine -dextromethorphan (PROMETHAZINE -DM) 6.25-15 MG/5ML syrup Take 5 mLs by mouth 4 (four) times daily as needed for cough.   Semaglutide ,0.25 or 0.5MG /DOS, 2 MG/3ML SOPN Inject 0.5 mg into the skin once a week.   Vitamin D , Ergocalciferol , (DRISDOL ) 1.25 MG (50000 UNIT) CAPS capsule Take 1 capsule (50,000 Units total) by mouth every 7 (seven) days.   amoxicillin -clavulanate (AUGMENTIN ) 875-125 MG tablet Take 1 tablet by mouth 2 (two) times daily. (Patient not taking: Reported on 03/25/2024)   cephALEXin  (KEFLEX ) 500 MG capsule Take 1 capsule (500 mg total) by mouth 3 (three) times daily. (Patient not taking: Reported on 03/25/2024)   No facility-administered encounter medications on file as of 03/25/2024.    Review of Systems  Review of Systems  Constitutional: Negative.   HENT: Negative.    Cardiovascular: Negative.   Gastrointestinal: Negative.  Allergic/Immunologic: Negative.   Neurological: Negative.   Psychiatric/Behavioral: Negative.       Objective:   BP 133/89   Pulse 74   Temp 97.9 F (36.6 C) (Oral)   Wt 286 lb 12.8 oz (130.1 kg)   SpO2 98%   BMI 47.18 kg/m   Wt Readings from Last 5 Encounters:  03/25/24 286 lb 12.8 oz (130.1 kg)  01/31/24 286 lb 6.4 oz (129.9 kg)  08/02/23 289 lb (131.1 kg)  07/25/23 283 lb 9.6 oz (128.6 kg)  04/30/23 287 lb (130.2 kg)     Physical Exam Vitals  and nursing note reviewed.  Constitutional:      General: She is not in acute distress.    Appearance: She is well-developed.  Cardiovascular:     Rate and Rhythm: Normal rate and regular rhythm.  Pulmonary:     Effort: Pulmonary effort is normal.     Breath sounds: Normal breath sounds.  Neurological:     Mental Status: She is alert and oriented to person, place, and time.       Assessment & Plan:   Screen for STD (sexually transmitted disease) -     NuSwab Vaginitis Plus (VG+) -     RPR+HIV+GC+CT Panel -     Chlamydia/Gonococcus/Trichomonas, NAA     Return in about 3 months (around 06/25/2024), or if symptoms worsen or fail to improve.   Jerrlyn Morel, NP 03/25/2024

## 2024-03-26 LAB — NUSWAB VAGINITIS PLUS (VG+)
Candida albicans, NAA: NEGATIVE
Candida glabrata, NAA: NEGATIVE
Chlamydia trachomatis, NAA: NEGATIVE
Neisseria gonorrhoeae, NAA: NEGATIVE
Trich vag by NAA: NEGATIVE

## 2024-03-27 ENCOUNTER — Ambulatory Visit: Payer: Self-pay | Admitting: Nurse Practitioner

## 2024-03-27 LAB — RPR+HIV+GC+CT PANEL
HIV Screen 4th Generation wRfx: NONREACTIVE
RPR Ser Ql: NONREACTIVE

## 2024-03-27 LAB — CHLAMYDIA/GONOCOCCUS/TRICHOMONAS, NAA
Chlamydia by NAA: NEGATIVE
Gonococcus by NAA: NEGATIVE
Trich vag by NAA: NEGATIVE

## 2024-04-04 ENCOUNTER — Other Ambulatory Visit (HOSPITAL_COMMUNITY): Payer: Self-pay

## 2024-04-21 ENCOUNTER — Other Ambulatory Visit: Payer: Self-pay

## 2024-04-21 ENCOUNTER — Other Ambulatory Visit (HOSPITAL_COMMUNITY): Payer: Self-pay

## 2024-05-01 ENCOUNTER — Other Ambulatory Visit (HOSPITAL_COMMUNITY): Payer: Self-pay

## 2024-05-03 ENCOUNTER — Other Ambulatory Visit: Payer: Self-pay | Admitting: Nurse Practitioner

## 2024-05-03 DIAGNOSIS — I10 Essential (primary) hypertension: Secondary | ICD-10-CM

## 2024-05-05 ENCOUNTER — Other Ambulatory Visit (HOSPITAL_COMMUNITY): Payer: Self-pay

## 2024-05-05 ENCOUNTER — Other Ambulatory Visit: Payer: Self-pay

## 2024-05-05 MED ORDER — AMLODIPINE BESYLATE 5 MG PO TABS
5.0000 mg | ORAL_TABLET | Freq: Every day | ORAL | 0 refills | Status: DC
  Filled 2024-05-05 – 2024-05-16 (×2): qty 90, 90d supply, fill #0

## 2024-05-15 ENCOUNTER — Other Ambulatory Visit (HOSPITAL_COMMUNITY): Payer: Self-pay

## 2024-05-16 ENCOUNTER — Other Ambulatory Visit (HOSPITAL_COMMUNITY): Payer: Self-pay

## 2024-06-26 ENCOUNTER — Encounter: Payer: Self-pay | Admitting: Nurse Practitioner

## 2024-06-26 ENCOUNTER — Other Ambulatory Visit (HOSPITAL_COMMUNITY): Payer: Self-pay

## 2024-06-26 ENCOUNTER — Ambulatory Visit: Payer: Self-pay | Admitting: Nurse Practitioner

## 2024-06-26 VITALS — BP 130/80 | HR 84 | Temp 98.0°F | Wt 283.8 lb

## 2024-06-26 DIAGNOSIS — H6690 Otitis media, unspecified, unspecified ear: Secondary | ICD-10-CM

## 2024-06-26 DIAGNOSIS — R600 Localized edema: Secondary | ICD-10-CM

## 2024-06-26 DIAGNOSIS — I10 Essential (primary) hypertension: Secondary | ICD-10-CM | POA: Diagnosis not present

## 2024-06-26 MED ORDER — HYDROCHLOROTHIAZIDE 12.5 MG PO CAPS
12.5000 mg | ORAL_CAPSULE | Freq: Every day | ORAL | 2 refills | Status: DC
  Filled 2024-06-26: qty 10, 10d supply, fill #0
  Filled 2024-06-26: qty 20, 20d supply, fill #0
  Filled 2024-07-21 – 2024-08-04 (×2): qty 30, 30d supply, fill #1
  Filled 2024-09-16 – 2024-10-06 (×2): qty 30, 30d supply, fill #2

## 2024-06-26 MED ORDER — TRIAMCINOLONE ACETONIDE 0.1 % EX CREA
1.0000 | TOPICAL_CREAM | Freq: Two times a day (BID) | CUTANEOUS | 0 refills | Status: DC
  Filled 2024-06-26: qty 30, 15d supply, fill #0

## 2024-06-26 MED ORDER — VITAMIN D (ERGOCALCIFEROL) 1.25 MG (50000 UNIT) PO CAPS
50000.0000 [IU] | ORAL_CAPSULE | ORAL | 2 refills | Status: DC
  Filled 2024-06-26: qty 5, 35d supply, fill #0
  Filled 2024-07-21: qty 5, 35d supply, fill #1
  Filled 2024-07-22: qty 4, 28d supply, fill #1
  Filled 2024-08-04: qty 5, 35d supply, fill #1
  Filled 2024-09-16 – 2024-10-06 (×2): qty 4, 28d supply, fill #2
  Filled 2024-11-03: qty 4, 28d supply, fill #3

## 2024-06-26 MED ORDER — FLUTICASONE PROPIONATE 50 MCG/ACT NA SUSP
2.0000 | Freq: Every day | NASAL | 6 refills | Status: AC
  Filled 2024-06-26: qty 16, 30d supply, fill #0
  Filled 2024-09-16 – 2024-11-03 (×2): qty 16, 30d supply, fill #1

## 2024-06-26 MED ORDER — ALBUTEROL SULFATE HFA 108 (90 BASE) MCG/ACT IN AERS
2.0000 | INHALATION_SPRAY | RESPIRATORY_TRACT | 0 refills | Status: AC | PRN
  Filled 2024-06-26: qty 6.7, 30d supply, fill #0

## 2024-06-26 MED ORDER — AMOXICILLIN-POT CLAVULANATE 875-125 MG PO TABS
1.0000 | ORAL_TABLET | Freq: Two times a day (BID) | ORAL | 0 refills | Status: DC
  Filled 2024-06-26: qty 20, 10d supply, fill #0

## 2024-06-26 NOTE — Progress Notes (Signed)
 Subjective   Patient ID: Ariana Rogers, female    DOB: Apr 11, 1986, 38 y.o.   MRN: [id number]  Chief Complaint  Patient presents with   Medical Management of Chronic Issues   Rash    Patient stated that she has had a rash on lower legs   Ear Pain    Right ear pain    Referring provider: Oley Bascom RAMAN, NP  Ariana Rogers is a 38 y.o. female with Past Medical History: No date: Acute bronchitis 03/2019: Cough 03/2019: GERD (gastroesophageal reflux disease) No date: Left breast mass No date: Smoker 09/2020: Vitamin D  deficiency   HPI  Patient presents today for follow-up.  She states that she does have a rash to both lower legs and swelling..  She has been experiencing right ear pain recently. Denies f/c/s, n/v/d, hemoptysis, PND, leg swelling Denies chest pain or edema     No Known Allergies  Immunization History  Administered Date(s) Administered   Influenza,inj,Quad PF,6+ Mos 01/15/2018, 09/27/2020, 08/17/2021   PFIZER(Purple Top)SARS-COV-2 Vaccination 06/07/2020, 07/01/2020   PPD Test 03/26/2018   Pneumococcal Polysaccharide-23 08/17/2021   Tdap 01/15/2018    Tobacco History: Social History   Tobacco Use  Smoking Status Former   Current packs/day: 0.00   Types: Cigarettes, Cigars   Quit date: 03/30/2021   Years since quitting: 3.2  Smokeless Tobacco Never  Tobacco Comments   Black and mild's  2 per day for >10 years.  HFB 07/25/2023   Counseling given: Not Answered Tobacco comments: Black and mild's  2 per day for >10 years.  HFB 07/25/2023   Outpatient Encounter Medications as of 06/26/2024  Medication Sig   albuterol  (VENTOLIN  HFA) 108 (90 Base) MCG/ACT inhaler Inhale 2 puffs into the lungs every 4 (four) hours as needed for wheezing or shortness of breath.   amLODipine  (NORVASC ) 5 MG tablet Take 1 tablet (5 mg total) by mouth daily.   Blood Glucose Monitoring Suppl (BLOOD GLUCOSE MONITOR SYSTEM) w/Device KIT Use up to 4 (four) times daily as directed.    cephALEXin  (KEFLEX ) 500 MG capsule Take 1 capsule (500 mg total) by mouth 3 (three) times daily.   cetirizine  (ZYRTEC ) 10 MG chewable tablet Chew 10 mg by mouth daily as needed.   clindamycin  (CLINDAGEL ) 1 % gel Apply topically 2 (two) times daily.   clindamycin  (CLINDAGEL ) 1 % gel Apply topically 2 (two) times daily.   fluticasone  (FLONASE ) 50 MCG/ACT nasal spray Place 2 sprays into both nostrils daily.   glucose blood test strip Use up to 4 (four) times daily as directed.   guaiFENesin -codeine  (ROBITUSSIN AC) 100-10 MG/5ML syrup Take 5 mLs by mouth 3 (three) times daily as needed for cough.   hydrOXYzine  (ATARAX ) 10 MG tablet Take 1 tablet (10 mg total) by mouth 3 (three) times daily as needed.   Lancets (FREESTYLE) lancets Use up to 4 (four) times daily as directed.   promethazine -dextromethorphan (PROMETHAZINE -DM) 6.25-15 MG/5ML syrup Take 5 mLs by mouth 4 (four) times daily as needed for cough.   Semaglutide ,0.25 or 0.5MG /DOS, 2 MG/3ML SOPN Inject 0.5 mg into the skin once a week.   Vitamin D , Ergocalciferol , (DRISDOL ) 1.25 MG (50000 UNIT) CAPS capsule Take 1 capsule (50,000 Units total) by mouth every 7 (seven) days.   amoxicillin -clavulanate (AUGMENTIN ) 875-125 MG tablet Take 1 tablet by mouth 2 (two) times daily. (Patient not taking: Reported on 03/25/2024)   No facility-administered encounter medications on file as of 06/26/2024.    Review of Systems  Review of Systems  Constitutional: Negative.   HENT: Negative.    Cardiovascular: Negative.   Gastrointestinal: Negative.   Allergic/Immunologic: Negative.   Neurological: Negative.   Psychiatric/Behavioral: Negative.       Objective:   BP 130/80   Pulse 84   Temp 98 F (36.7 C) (Oral)   Wt 283 lb 12.8 oz (128.7 kg)   SpO2 98%   BMI 46.69 kg/m   Wt Readings from Last 5 Encounters:  06/26/24 283 lb 12.8 oz (128.7 kg)  03/25/24 286 lb 12.8 oz (130.1 kg)  01/31/24 286 lb 6.4 oz (129.9 kg)  08/02/23 289 lb (131.1 kg)   07/25/23 283 lb 9.6 oz (128.6 kg)     Physical Exam Vitals and nursing note reviewed.  Constitutional:      General: She is not in acute distress.    Appearance: She is well-developed.  Cardiovascular:     Rate and Rhythm: Normal rate and regular rhythm.  Pulmonary:     Effort: Pulmonary effort is normal.     Breath sounds: Normal breath sounds.  Neurological:     Mental Status: She is alert and oriented to person, place, and time.       Assessment & Plan:   There are no diagnoses linked to this encounter.   No follow-ups on file.   Bascom GORMAN Borer, NP 06/26/2024

## 2024-06-27 ENCOUNTER — Ambulatory Visit: Payer: Self-pay | Admitting: Nurse Practitioner

## 2024-06-27 DIAGNOSIS — D72829 Elevated white blood cell count, unspecified: Secondary | ICD-10-CM

## 2024-06-27 LAB — COMPREHENSIVE METABOLIC PANEL WITH GFR
ALT: 12 IU/L (ref 0–32)
AST: 17 IU/L (ref 0–40)
Albumin: 3.7 g/dL — ABNORMAL LOW (ref 3.9–4.9)
Alkaline Phosphatase: 59 IU/L (ref 44–121)
BUN/Creatinine Ratio: 9 (ref 9–23)
BUN: 7 mg/dL (ref 6–20)
Bilirubin Total: 0.6 mg/dL (ref 0.0–1.2)
CO2: 21 mmol/L (ref 20–29)
Calcium: 8.9 mg/dL (ref 8.7–10.2)
Chloride: 107 mmol/L — ABNORMAL HIGH (ref 96–106)
Creatinine, Ser: 0.81 mg/dL (ref 0.57–1.00)
Globulin, Total: 2 g/dL (ref 1.5–4.5)
Glucose: 87 mg/dL (ref 70–99)
Potassium: 4.2 mmol/L (ref 3.5–5.2)
Sodium: 143 mmol/L (ref 134–144)
Total Protein: 5.7 g/dL — ABNORMAL LOW (ref 6.0–8.5)
eGFR: 95 mL/min/1.73 (ref >59)

## 2024-06-27 LAB — CBC
Hematocrit: 41.9 % (ref 34.0–46.6)
Hemoglobin: 12.8 g/dL (ref 11.1–15.9)
MCH: 24.5 pg — ABNORMAL LOW (ref 26.6–33.0)
MCHC: 30.5 g/dL — ABNORMAL LOW (ref 31.5–35.7)
MCV: 80 fL (ref 79–97)
Platelets: 331 x10E3/uL (ref 150–450)
RBC: 5.23 x10E6/uL (ref 3.77–5.28)
RDW: 15.1 % (ref 11.7–15.4)
WBC: 14.2 x10E3/uL — ABNORMAL HIGH (ref 3.4–10.8)

## 2024-06-27 LAB — LIPID PANEL
Chol/HDL Ratio: 3.9 ratio (ref 0.0–4.4)
Cholesterol, Total: 163 mg/dL (ref 100–199)
HDL: 42 mg/dL (ref >39)
LDL Chol Calc (NIH): 100 mg/dL — ABNORMAL HIGH (ref 0–99)
Triglycerides: 114 mg/dL (ref 0–149)
VLDL Cholesterol Cal: 21 mg/dL (ref 5–40)

## 2024-06-27 LAB — BRAIN NATRIURETIC PEPTIDE: BNP: 14.5 pg/mL (ref 0.0–100.0)

## 2024-07-21 ENCOUNTER — Other Ambulatory Visit (HOSPITAL_COMMUNITY): Payer: Self-pay

## 2024-07-21 ENCOUNTER — Other Ambulatory Visit: Payer: Self-pay | Admitting: Nurse Practitioner

## 2024-07-21 ENCOUNTER — Other Ambulatory Visit: Payer: Self-pay

## 2024-07-21 DIAGNOSIS — H6690 Otitis media, unspecified, unspecified ear: Secondary | ICD-10-CM

## 2024-07-21 DIAGNOSIS — I10 Essential (primary) hypertension: Secondary | ICD-10-CM

## 2024-07-21 DIAGNOSIS — J40 Bronchitis, not specified as acute or chronic: Secondary | ICD-10-CM

## 2024-07-21 MED ORDER — AMOXICILLIN-POT CLAVULANATE 875-125 MG PO TABS
1.0000 | ORAL_TABLET | Freq: Two times a day (BID) | ORAL | 0 refills | Status: AC
  Filled 2024-07-21 – 2024-10-06 (×3): qty 20, 10d supply, fill #0

## 2024-07-21 MED ORDER — AMLODIPINE BESYLATE 5 MG PO TABS
5.0000 mg | ORAL_TABLET | Freq: Every day | ORAL | 0 refills | Status: DC
  Filled 2024-07-21 – 2024-08-13 (×2): qty 90, 90d supply, fill #0

## 2024-07-21 MED ORDER — CLINDAMYCIN PHOS (TWICE-DAILY) 1 % EX GEL
Freq: Two times a day (BID) | CUTANEOUS | 0 refills | Status: DC
  Filled 2024-07-21: qty 30, 15d supply, fill #0
  Filled 2024-08-13: qty 30, 14d supply, fill #0

## 2024-07-21 MED ORDER — PROMETHAZINE-DM 6.25-15 MG/5ML PO SYRP
5.0000 mL | ORAL_SOLUTION | Freq: Four times a day (QID) | ORAL | 0 refills | Status: DC | PRN
  Filled 2024-07-21: qty 118, 6d supply, fill #0

## 2024-07-22 ENCOUNTER — Other Ambulatory Visit (HOSPITAL_COMMUNITY): Payer: Self-pay

## 2024-07-25 ENCOUNTER — Inpatient Hospital Stay: Admitting: Hematology and Oncology

## 2024-07-25 ENCOUNTER — Inpatient Hospital Stay: Attending: Hematology and Oncology

## 2024-07-25 VITALS — BP 119/78 | HR 73 | Temp 98.0°F | Resp 20 | Ht 65.0 in | Wt 279.2 lb

## 2024-07-25 DIAGNOSIS — D72829 Elevated white blood cell count, unspecified: Secondary | ICD-10-CM

## 2024-07-25 DIAGNOSIS — D72825 Bandemia: Secondary | ICD-10-CM

## 2024-07-25 LAB — CMP (CANCER CENTER ONLY)
ALT: 12 U/L (ref 0–44)
AST: 13 U/L — ABNORMAL LOW (ref 15–41)
Albumin: 3.9 g/dL (ref 3.5–5.0)
Alkaline Phosphatase: 53 U/L (ref 38–126)
Anion gap: 4 — ABNORMAL LOW (ref 5–15)
BUN: 10 mg/dL (ref 6–20)
CO2: 28 mmol/L (ref 22–32)
Calcium: 8.5 mg/dL — ABNORMAL LOW (ref 8.9–10.3)
Chloride: 109 mmol/L (ref 98–111)
Creatinine: 0.83 mg/dL (ref 0.44–1.00)
GFR, Estimated: >60 mL/min (ref >60)
Glucose, Bld: 85 mg/dL (ref 70–99)
Potassium: 3.8 mmol/L (ref 3.5–5.1)
Sodium: 141 mmol/L (ref 135–145)
Total Bilirubin: 0.6 mg/dL (ref 0.0–1.2)
Total Protein: 6.4 g/dL — ABNORMAL LOW (ref 6.5–8.1)

## 2024-07-25 LAB — CBC WITH DIFFERENTIAL/PLATELET
Abs Immature Granulocytes: 0.06 K/uL (ref 0.00–0.07)
Basophils Absolute: 0.1 K/uL (ref 0.0–0.1)
Basophils Relative: 1 %
Eosinophils Absolute: 0.2 K/uL (ref 0.0–0.5)
Eosinophils Relative: 1 %
HCT: 39.9 % (ref 36.0–46.0)
Hemoglobin: 12.5 g/dL (ref 12.0–15.0)
Immature Granulocytes: 1 %
Lymphocytes Relative: 31 %
Lymphs Abs: 4.1 K/uL — ABNORMAL HIGH (ref 0.7–4.0)
MCH: 24.1 pg — ABNORMAL LOW (ref 26.0–34.0)
MCHC: 31.3 g/dL (ref 30.0–36.0)
MCV: 77 fL — ABNORMAL LOW (ref 80.0–100.0)
Monocytes Absolute: 0.8 K/uL (ref 0.1–1.0)
Monocytes Relative: 6 %
Neutro Abs: 8 K/uL — ABNORMAL HIGH (ref 1.7–7.7)
Neutrophils Relative %: 60 %
Platelets: 358 K/uL (ref 150–400)
RBC: 5.18 MIL/uL — ABNORMAL HIGH (ref 3.87–5.11)
RDW: 15.6 % — ABNORMAL HIGH (ref 11.5–15.5)
WBC: 13.3 K/uL — ABNORMAL HIGH (ref 4.0–10.5)
nRBC: 0 % (ref 0.0–0.2)

## 2024-07-25 LAB — C-REACTIVE PROTEIN: CRP: 0.5 mg/dL (ref <1.0)

## 2024-07-25 LAB — SURGICAL PATHOLOGY

## 2024-07-25 NOTE — Progress Notes (Signed)
  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 Possible causes: smoking, chronic inflammation, autoimmune conditions, obesity. I dont think there is any underlying myeloproliferative disorder. She has been seen by Dr Amadeo many yrs ago and her CBC over the past several yrs didn't significantly change.  No concerns on physical exam. There was no differential on the most recent labs.  - Order CBC with differential, CRP, CMP, Jak 2 testing and flow cytometry given absolute lymphocytosis - Coordinate with PCP for joint swelling and autoimmune evaluation. - Follow up in two weeks with lab results.   Orders Placed This Encounter  Procedures   CBC with Differential/Platelet    Standing Status:   Standing    Number of Occurrences:   22    Expiration Date:   07/25/2025   C-reactive protein    Standing Status:   Future    Number of Occurrences:   1    Expiration Date:   07/25/2025   ANA, IFA (with reflex)    Standing Status:   Future    Number of Occurrences:   1    Expiration Date:   07/25/2025   CMP (Cancer Center only)    Standing Status:   Future    Number of Occurrences:   1    Expiration Date:   07/25/2025   Flow Cytometry, Peripheral Blood (Oncology)    lymphocytosis    Standing Status:   Future    Number of Occurrences:   1    Expected Date:   07/25/2024    Expiration Date:   07/25/2025   NGS JAK2 E12-15/CALR/MPL    Leukocytosis, persistent, bandemia, lymphocytosis.     HISTORY OF PRESENTING ILLNESS:  Latesha Bungert 38 y.o. female is here because of leukocytosis.  Discussed the use of AI scribe software for clinical note transcription with the patient, who gave verbal consent to proceed.  History of Present  Illness Lillee Mooneyhan is a 38 year old female who presents with high white blood cell count. She was referred by her primary care nurse practitioner for evaluation of high white blood cell count.  She has a history of chronic leukocytosis with white blood cell counts consistently above normal for several years. Her white blood cell count was 14,200 three years ago and remains unchanged as of four weeks ago. No new symptoms have prompted the referral, except for a recent ear infection that still feels 'weird'.  She has a history of joint swelling, particularly in her knees and ankles, which has been managed with diuretics. She underwent knee surgery last year. There is a family history of rheumatoid arthritis, but she denies any joint pain, only swelling.  She has a history of acute bronchitis and a persistent cough, which sometimes causes her to feel lightheaded. She smokes heavily.  No fever, night sweats, significant weight loss, changes in appetite, breathing difficulties, bowel movement issues, or urinary problems.  All other systems were reviewed with the patient and are negative.  MEDICAL HISTORY:  Past Medical History:  Diagnosis Date   Acute bronchitis    Cough 03/2019   GERD (gastroesophageal reflux disease) 03/2019   Left breast mass    Smoker    Vitamin D  deficiency 09/2020    SURGICAL HISTORY: Past Surgical History:  Procedure Laterality Date   DENTAL SURGERY     TUBAL LIGATION  2 years ago    SOCIAL HISTORY: Social History   Socioeconomic History   Marital status: Single    Spouse name: Not on file   Number of children: 4   Years of education: Not on file   Highest education level: Some college, no degree  Occupational History   Not on file  Tobacco Use   Smoking status: Former    Current packs/day: 0.00    Types: Cigarettes, Cigars    Quit date: 03/30/2021    Years since quitting: 3.3   Smokeless tobacco: Never   Tobacco comments:    Black and mild's  2 per  day for >10 years.  HFB 07/25/2023  Vaping Use   Vaping status: Never Used  Substance and Sexual Activity   Alcohol use: Yes    Comment: occasionally   Drug use: Yes    Types: Marijuana    Comment: edibles   Sexual activity: Yes    Birth control/protection: Surgical  Other Topics Concern   Not on file  Social History Narrative   Not on file   Social Drivers of Health   Financial Resource Strain: Not on file  Food Insecurity: No Food Insecurity (03/25/2024)   Hunger Vital Sign    Worried About Running Out of Food in the Last Year: Never true    Ran Out of Food in the Last Year: Never true  Transportation Needs: No Transportation Needs (03/25/2024)   PRAPARE - Administrator, Civil Service (Medical): No    Lack of Transportation (Non-Medical): No  Physical Activity: Not on file  Stress: Not on file  Social Connections: Not on file  Intimate Partner Violence: Not on file    FAMILY HISTORY: Family History  Problem Relation Age of Onset   Hypertension Mother    Arthritis Mother    Fibromyalgia Mother    Other Father        no health concern    ALLERGIES:  has no known allergies.  MEDICATIONS:  Current Outpatient Medications  Medication Sig Dispense Refill   albuterol  (VENTOLIN  HFA) 108 (90 Base) MCG/ACT inhaler Inhale 2 puffs into the lungs every 4 (four) hours as needed for wheezing or shortness of breath. 6.7 g 0   amLODipine  (NORVASC ) 5 MG tablet Take 1 tablet (5 mg total) by mouth daily. 90 tablet 0   amoxicillin -clavulanate (AUGMENTIN ) 875-125 MG tablet Take 1 tablet by mouth 2 (two) times daily. (Patient not taking: Reported on 03/25/2024) 20 tablet 0   amoxicillin -clavulanate (AUGMENTIN ) 875-125 MG tablet Take 1 tablet by mouth 2 (two) times daily for 10 days. 20 tablet 0   Blood Glucose Monitoring Suppl (BLOOD GLUCOSE MONITOR SYSTEM) w/Device KIT Use up to 4 (four) times daily as directed. 1 kit 0   cephALEXin  (KEFLEX ) 500 MG capsule Take 1 capsule (500  mg total) by mouth 3 (three) times daily. 30 capsule 0   cetirizine  (ZYRTEC ) 10 MG chewable tablet Chew 10 mg by mouth daily as needed.     clindamycin  (CLINDAGEL ) 1 % gel Apply topically 2 (two) times daily. 30 g 0   clindamycin  (CLINDAGEL ) 1 % gel Apply topically 2 (two) times daily. 30 g 0   fluticasone  (FLONASE ) 50 MCG/ACT nasal spray Place 2 sprays into both nostrils daily. 16 g 6   glucose blood test strip Use up to 4 (four) times daily as directed. 100 each 0   guaiFENesin -codeine  (  ROBITUSSIN AC) 100-10 MG/5ML syrup Take 5 mLs by mouth 3 (three) times daily as needed for cough. 120 mL 0   hydrochlorothiazide  (MICROZIDE ) 12.5 MG capsule Take 1 capsule (12.5 mg total) by mouth daily. 30 capsule 2   hydrOXYzine  (ATARAX ) 10 MG tablet Take 1 tablet (10 mg total) by mouth 3 (three) times daily as needed. 30 tablet 0   Lancets (FREESTYLE) lancets Use up to 4 (four) times daily as directed. 100 each 0   promethazine -dextromethorphan (PROMETHAZINE -DM) 6.25-15 MG/5ML syrup Take 5 mLs by mouth 4 (four) times daily as needed for cough. 118 mL 0   Semaglutide ,0.25 or 0.5MG /DOS, 2 MG/3ML SOPN Inject 0.5 mg into the skin once a week. 3 mL 2   triamcinolone  cream (KENALOG ) 0.1 % Apply 1 Application topically 2 (two) times daily. 30 g 0   Vitamin D , Ergocalciferol , (DRISDOL ) 1.25 MG (50000 UNIT) CAPS capsule Take 1 capsule (50,000 Units total) by mouth every 7 (seven) days. 5 capsule 2   No current facility-administered medications for this visit.     PHYSICAL EXAMINATION: ECOG PERFORMANCE STATUS: 0 - Asymptomatic  Vitals:   07/25/24 1058  BP: 119/78  Pulse: 73  Resp: 20  Temp: 98 F (36.7 C)  SpO2: 100%   Filed Weights   07/25/24 1058  Weight: 279 lb 3.2 oz (126.6 kg)    GENERAL:alert, no distress and comfortable SKIN: skin color, texture, turgor are normal, no rashes or significant lesions EYES: normal, conjunctiva are pink and non-injected, sclera clear OROPHARYNX:no exudate, no  erythema and lips, buccal mucosa, and tongue normal  NECK: supple, thyroid  normal size, non-tender, without nodularity LYMPH:  no palpable lymphadenopathy in the cervical, axillary LUNGS: clear to auscultation and percussion with normal breathing effort HEART: regular rate & rhythm and no murmurs and no lower extremity edema ABDOMEN:abdomen soft, non-tender and normal bowel sounds Musculoskeletal:no cyanosis of digits and no clubbing  PSYCH: alert & oriented x 3 with fluent speech NEURO: no focal motor/sensory deficits  LABORATORY DATA:  I have reviewed the data as listed Lab Results  Component Value Date   WBC 13.3 (H) 07/25/2024   HGB 12.5 07/25/2024   HCT 39.9 07/25/2024   MCV 77.0 (L) 07/25/2024   PLT 358 07/25/2024     Chemistry      Component Value Date/Time   NA 141 07/25/2024 1124   NA 143 06/26/2024 1107   K 3.8 07/25/2024 1124   CL 109 07/25/2024 1124   CO2 28 07/25/2024 1124   BUN 10 07/25/2024 1124   BUN 7 06/26/2024 1107   CREATININE 0.83 07/25/2024 1124      Component Value Date/Time   CALCIUM 8.5 (L) 07/25/2024 1124   ALKPHOS 53 07/25/2024 1124   AST 13 (L) 07/25/2024 1124   ALT 12 07/25/2024 1124   BILITOT 0.6 07/25/2024 1124       RADIOGRAPHIC STUDIES: I have personally reviewed the radiological images as listed and agreed with the findings in the report. No results found.  All questions were answered. The patient knows to call the clinic with any problems, questions or concerns. I spent 30 minutes in the care of this patient including H and P, review of records, counseling and coordination of care.     Amber Stalls, MD 07/25/2024 3:23 PM

## 2024-07-28 LAB — FANA STAINING PATTERNS

## 2024-07-28 LAB — FLOW CYTOMETRY

## 2024-07-28 LAB — ANTINUCLEAR ANTIBODIES, IFA: ANA Ab, IFA: POSITIVE — AB

## 2024-07-30 ENCOUNTER — Other Ambulatory Visit (HOSPITAL_COMMUNITY): Payer: Self-pay

## 2024-07-31 ENCOUNTER — Other Ambulatory Visit (HOSPITAL_COMMUNITY): Payer: Self-pay

## 2024-08-01 ENCOUNTER — Ambulatory Visit: Payer: Self-pay | Admitting: Nurse Practitioner

## 2024-08-01 ENCOUNTER — Other Ambulatory Visit (HOSPITAL_COMMUNITY): Payer: Self-pay

## 2024-08-01 VITALS — BP 124/71 | HR 88 | Wt 264.8 lb

## 2024-08-01 DIAGNOSIS — R053 Chronic cough: Secondary | ICD-10-CM | POA: Diagnosis not present

## 2024-08-01 DIAGNOSIS — J40 Bronchitis, not specified as acute or chronic: Secondary | ICD-10-CM | POA: Diagnosis not present

## 2024-08-01 MED ORDER — PROMETHAZINE-DM 6.25-15 MG/5ML PO SYRP
5.0000 mL | ORAL_SOLUTION | Freq: Four times a day (QID) | ORAL | 0 refills | Status: DC | PRN
  Filled 2024-08-01: qty 118, 6d supply, fill #0

## 2024-08-01 MED ORDER — BENZONATATE 200 MG PO CAPS
200.0000 mg | ORAL_CAPSULE | Freq: Two times a day (BID) | ORAL | 0 refills | Status: AC | PRN
  Filled 2024-08-01: qty 20, 10d supply, fill #0

## 2024-08-01 MED ORDER — AZITHROMYCIN 250 MG PO TABS
ORAL_TABLET | ORAL | 0 refills | Status: AC
  Filled 2024-08-01: qty 6, 5d supply, fill #0

## 2024-08-01 NOTE — Progress Notes (Signed)
 Subjective   Patient ID: Ariana Rogers, female    DOB: Sep 27, 1986, 38 y.o.   MRN: [id number]  Chief Complaint  Patient presents with   Medical Management of Chronic Issues    Go over lab results from hematologist    Cough    Referring provider: Oley Bascom RAMAN, NP  Ariana Rogers is a 38 y.o. female with Past Medical History: No date: Acute bronchitis 03/2019: Cough 03/2019: GERD (gastroesophageal reflux disease) No date: Left breast mass No date: Smoker 09/2020: Vitamin D  deficiency    HPI  Patient presents today with a chronic cough.  She states this has been going on for the past few weeks.  We will trial azithromycin .  We will trial cough syrup.  Patient does want to discuss labs from hematology but she does have an upcoming appointment on 08/08/2024 and does need to discuss this with the hematologist. Denies f/c/s, n/v/d, hemoptysis, PND, leg swelling Denies chest pain or edema       No Known Allergies  Immunization History  Administered Date(s) Administered   Influenza,inj,Quad PF,6+ Mos 01/15/2018, 09/27/2020, 08/17/2021   PFIZER(Purple Top)SARS-COV-2 Vaccination 06/07/2020, 07/01/2020   PPD Test 03/26/2018   Pneumococcal Polysaccharide-23 08/17/2021   Tdap 01/15/2018    Tobacco History: Social History   Tobacco Use  Smoking Status Former   Current packs/day: 0.00   Types: Cigarettes, Cigars   Quit date: 03/30/2021   Years since quitting: 3.3  Smokeless Tobacco Never  Tobacco Comments   Black and mild's  2 per day for >10 years.  HFB 07/25/2023   Counseling given: Not Answered Tobacco comments: Black and mild's  2 per day for >10 years.  HFB 07/25/2023   Outpatient Encounter Medications as of 08/01/2024  Medication Sig   albuterol  (VENTOLIN  HFA) 108 (90 Base) MCG/ACT inhaler Inhale 2 puffs into the lungs every 4 (four) hours as needed for wheezing or shortness of breath.   amLODipine  (NORVASC ) 5 MG tablet Take 1 tablet (5 mg total) by mouth daily.    amoxicillin -clavulanate (AUGMENTIN ) 875-125 MG tablet Take 1 tablet by mouth 2 (two) times daily.   amoxicillin -clavulanate (AUGMENTIN ) 875-125 MG tablet Take 1 tablet by mouth 2 (two) times daily for 10 days.   [EXPIRED] azithromycin  (ZITHROMAX ) 250 MG tablet Take 2 tablets on day 1, then 1 tablet daily on days 2 through 5   benzonatate  (TESSALON ) 200 MG capsule Take 1 capsule (200 mg total) by mouth 2 (two) times daily as needed for cough.   Blood Glucose Monitoring Suppl (BLOOD GLUCOSE MONITOR SYSTEM) w/Device KIT Use up to 4 (four) times daily as directed.   cephALEXin  (KEFLEX ) 500 MG capsule Take 1 capsule (500 mg total) by mouth 3 (three) times daily.   cetirizine  (ZYRTEC ) 10 MG chewable tablet Chew 10 mg by mouth daily as needed.   clindamycin  (CLINDAGEL ) 1 % gel Apply topically 2 (two) times daily.   clindamycin  (CLINDAGEL ) 1 % gel Apply topically 2 (two) times daily.   fluticasone  (FLONASE ) 50 MCG/ACT nasal spray Place 2 sprays into both nostrils daily.   glucose blood test strip Use up to 4 (four) times daily as directed.   guaiFENesin -codeine  (ROBITUSSIN AC) 100-10 MG/5ML syrup Take 5 mLs by mouth 3 (three) times daily as needed for cough.   hydrochlorothiazide  (MICROZIDE ) 12.5 MG capsule Take 1 capsule (12.5 mg total) by mouth daily.   hydrOXYzine  (ATARAX ) 10 MG tablet Take 1 tablet (10 mg total) by mouth 3 (three) times daily as needed.   Lancets (  FREESTYLE) lancets Use up to 4 (four) times daily as directed.   Semaglutide ,0.25 or 0.5MG /DOS, 2 MG/3ML SOPN Inject 0.5 mg into the skin once a week.   triamcinolone  cream (KENALOG ) 0.1 % Apply 1 Application topically 2 (two) times daily.   Vitamin D , Ergocalciferol , (DRISDOL ) 1.25 MG (50000 UNIT) CAPS capsule Take 1 capsule (50,000 Units total) by mouth every 7 (seven) days.   [DISCONTINUED] promethazine -dextromethorphan (PROMETHAZINE -DM) 6.25-15 MG/5ML syrup Take 5 mLs by mouth 4 (four) times daily as needed for cough.    promethazine -dextromethorphan (PROMETHAZINE -DM) 6.25-15 MG/5ML syrup Take 5 mLs by mouth 4 (four) times daily as needed for cough.   No facility-administered encounter medications on file as of 08/01/2024.    Review of Systems  Review of Systems  Constitutional: Negative.   HENT: Negative.    Cardiovascular: Negative.   Gastrointestinal: Negative.   Allergic/Immunologic: Negative.   Neurological: Negative.   Psychiatric/Behavioral: Negative.       Objective:   BP 124/71 (BP Location: Left Arm, Patient Position: Sitting, Cuff Size: Large)   Pulse 88   Wt 264 lb 12.8 oz (120.1 kg)   SpO2 90%   BMI 44.07 kg/m   Wt Readings from Last 5 Encounters:  08/01/24 264 lb 12.8 oz (120.1 kg)  07/25/24 279 lb 3.2 oz (126.6 kg)  06/26/24 283 lb 12.8 oz (128.7 kg)  03/25/24 286 lb 12.8 oz (130.1 kg)  01/31/24 286 lb 6.4 oz (129.9 kg)     Physical Exam Vitals and nursing note reviewed.  Constitutional:      General: She is not in acute distress.    Appearance: She is well-developed.  Cardiovascular:     Rate and Rhythm: Normal rate and regular rhythm.  Pulmonary:     Effort: Pulmonary effort is normal.     Breath sounds: Normal breath sounds.  Neurological:     Mental Status: She is alert and oriented to person, place, and time.       Assessment & Plan:   Chronic cough -     DG Chest 2 View -     Azithromycin ; Take 2 tablets on day 1, then 1 tablet daily on days 2 through 5  Dispense: 6 tablet; Refill: 0 -     Benzonatate ; Take 1 capsule (200 mg total) by mouth 2 (two) times daily as needed for cough.  Dispense: 20 capsule; Refill: 0  Bronchitis -     Promethazine -DM; Take 5 mLs by mouth 4 (four) times daily as needed for cough.  Dispense: 118 mL; Refill: 0     Return in about 3 months (around 11/01/2024).   Bascom GORMAN Borer, NP 08/13/2024

## 2024-08-04 LAB — NGS JAK2 E12-15/CALR/MPL

## 2024-08-08 ENCOUNTER — Inpatient Hospital Stay: Attending: Hematology and Oncology | Admitting: Hematology and Oncology

## 2024-08-08 DIAGNOSIS — D72829 Elevated white blood cell count, unspecified: Secondary | ICD-10-CM | POA: Diagnosis not present

## 2024-08-08 DIAGNOSIS — D72825 Bandemia: Secondary | ICD-10-CM

## 2024-08-08 NOTE — Progress Notes (Signed)
 [facility] CONSULT NOTE  Patient Care Team: Ariana Bascom RAMAN, NP as PCP - General (Adult Health Nurse Practitioner)  CHIEF COMPLAINTS/PURPOSE OF CONSULTATION:  Leukocytosis,  Assessment and Plan  [name] with leukocytosis who is here for follow up. We reviewed labs which show mild leukocytosis. CMP normal. Flow negative, NGS JAK 2 testing/CALR/MPL 515 neg. CRP normal. ANA positive, hence we recommended referral to rheumatology. She is agreeable. We can see her back as needed.  HISTORY OF PRESENTING ILLNESS:  Ariana Rogers 38 y.o. female is here because of leukocytosis.  Discussed the use of AI scribe software for clinical note transcription with the patient, who gave verbal consent to proceed.  History of Present Illness Ariana Rogers is a 38 year old female who presents with high white blood cell count. She was referred by her primary care nurse practitioner for evaluation of high white blood cell count.  This is a telephone follow up to review her labs. She denies any new complaints today   MEDICAL HISTORY:  Past Medical History:  Diagnosis Date   Acute bronchitis    Cough 03/2019   GERD (gastroesophageal reflux disease) 03/2019   Left breast mass    Smoker    Vitamin D  deficiency 09/2020    SURGICAL HISTORY: Past Surgical History:  Procedure Laterality Date   DENTAL SURGERY     TUBAL LIGATION  2 years ago    SOCIAL HISTORY: Social History   Socioeconomic History   Marital status: Single    Spouse name: Not on file   Number of children: 4   Years of education: Not on file   Highest education level: Some college, no degree  Occupational History   Not on file  Tobacco Use   Smoking status: Former    Current packs/day: 0.00    Types: Cigarettes, Cigars    Quit date: 03/30/2021    Years since quitting: 3.3   Smokeless tobacco: Never   Tobacco comments:    Black and mild's  2 per day for >10 years.  HFB  07/25/2023  Vaping Use   Vaping status: Never Used  Substance and Sexual Activity   Alcohol use: Yes    Comment: occasionally   Drug use: Yes    Types: Marijuana    Comment: edibles   Sexual activity: Yes    Birth control/protection: Surgical  Other Topics Concern   Not on file  Social History Narrative   Not on file   Social Drivers of Health   Financial Resource Strain: Low Risk  (08/01/2024)   Overall Financial Resource Strain (CARDIA)    Difficulty of Paying Living Expenses: Not hard at all  Food Insecurity: Food Insecurity Present (08/01/2024)   Hunger Vital Sign    Worried About Running Out of Food in the Last Year: Sometimes true    Ran Out of Food in the Last Year: Never true  Transportation Needs: No Transportation Needs (08/01/2024)   PRAPARE - Administrator, Civil Service (Medical): No    Lack of Transportation (Non-Medical): No  Physical Activity: Insufficiently Active (08/01/2024)   Exercise Vital Sign    Days of Exercise per Week: 3 days    Minutes of Exercise per Session: 20 min  Stress: Stress Concern Present (08/01/2024)   [employer] of Occupational Health - Occupational Stress Questionnaire    Feeling of Stress: To some extent  Social Connections: Unknown (08/01/2024)   Social Connection  and Isolation Panel    Frequency of Communication with Friends and Family: More than three times a week    Frequency of Social Gatherings with Friends and Family: More than three times a week    Attends Religious Services: 1 to 4 times per year    Active Member of [employer] or Organizations: No    Attends Engineer, structural: Not on file    Marital Status: Patient declined  Catering manager Violence: Not on file    FAMILY HISTORY: Family History  Problem Relation Age of Onset   Hypertension Mother    Arthritis Mother    Fibromyalgia Mother    Other Father        no health concern    ALLERGIES:  has no known allergies.  MEDICATIONS:   Current Outpatient Medications  Medication Sig Dispense Refill   albuterol  (VENTOLIN  HFA) 108 (90 Base) MCG/ACT inhaler Inhale 2 puffs into the lungs every 4 (four) hours as needed for wheezing or shortness of breath. 6.7 g 0   amLODipine  (NORVASC ) 5 MG tablet Take 1 tablet (5 mg total) by mouth daily. 90 tablet 0   amoxicillin -clavulanate (AUGMENTIN ) 875-125 MG tablet Take 1 tablet by mouth 2 (two) times daily. 20 tablet 0   amoxicillin -clavulanate (AUGMENTIN ) 875-125 MG tablet Take 1 tablet by mouth 2 (two) times daily for 10 days. 20 tablet 0   azithromycin  (ZITHROMAX ) 250 MG tablet Take 2 tablets on day 1, then 1 tablet daily on days 2 through 5 6 tablet 0   benzonatate  (TESSALON ) 200 MG capsule Take 1 capsule (200 mg total) by mouth 2 (two) times daily as needed for cough. 20 capsule 0   Blood Glucose Monitoring Suppl (BLOOD GLUCOSE MONITOR SYSTEM) w/Device KIT Use up to 4 (four) times daily as directed. 1 kit 0   cephALEXin  (KEFLEX ) 500 MG capsule Take 1 capsule (500 mg total) by mouth 3 (three) times daily. 30 capsule 0   cetirizine  (ZYRTEC ) 10 MG chewable tablet Chew 10 mg by mouth daily as needed.     clindamycin  (CLINDAGEL ) 1 % gel Apply topically 2 (two) times daily. 30 g 0   clindamycin  (CLINDAGEL ) 1 % gel Apply topically 2 (two) times daily. 30 g 0   fluticasone  (FLONASE ) 50 MCG/ACT nasal spray Place 2 sprays into both nostrils daily. 16 g 6   glucose blood test strip Use up to 4 (four) times daily as directed. 100 each 0   guaiFENesin -codeine  (ROBITUSSIN AC) 100-10 MG/5ML syrup Take 5 mLs by mouth 3 (three) times daily as needed for cough. 120 mL 0   hydrochlorothiazide  (MICROZIDE ) 12.5 MG capsule Take 1 capsule (12.5 mg total) by mouth daily. 30 capsule 2   hydrOXYzine  (ATARAX ) 10 MG tablet Take 1 tablet (10 mg total) by mouth 3 (three) times daily as needed. 30 tablet 0   Lancets (FREESTYLE) lancets Use up to 4 (four) times daily as directed. 100 each 0    promethazine -dextromethorphan (PROMETHAZINE -DM) 6.25-15 MG/5ML syrup Take 5 mLs by mouth 4 (four) times daily as needed for cough. 118 mL 0   Semaglutide ,0.25 or 0.5MG /DOS, 2 MG/3ML SOPN Inject 0.5 mg into the skin once a week. 3 mL 2   triamcinolone  cream (KENALOG ) 0.1 % Apply 1 Application topically 2 (two) times daily. 30 g 0   Vitamin D , Ergocalciferol , (DRISDOL ) 1.25 MG (50000 UNIT) CAPS capsule Take 1 capsule (50,000 Units total) by mouth every 7 (seven) days. 5 capsule 2   No current facility-administered medications for  this visit.     PHYSICAL EXAMINATION: ECOG PERFORMANCE STATUS: 0 - Asymptomatic  Telephone visit.  LABORATORY DATA:  I have reviewed the data as listed Lab Results  Component Value Date   WBC 13.3 (H) 07/25/2024   HGB 12.5 07/25/2024   HCT 39.9 07/25/2024   MCV 77.0 (L) 07/25/2024   PLT 358 07/25/2024     Chemistry      Component Value Date/Time   NA 141 07/25/2024 1124   NA 143 06/26/2024 1107   K 3.8 07/25/2024 1124   CL 109 07/25/2024 1124   CO2 28 07/25/2024 1124   BUN 10 07/25/2024 1124   BUN 7 06/26/2024 1107   CREATININE 0.83 07/25/2024 1124      Component Value Date/Time   CALCIUM 8.5 (L) 07/25/2024 1124   ALKPHOS 53 07/25/2024 1124   AST 13 (L) 07/25/2024 1124   ALT 12 07/25/2024 1124   BILITOT 0.6 07/25/2024 1124       RADIOGRAPHIC STUDIES: I have personally reviewed the radiological images as listed and agreed with the findings in the report. No results found.  All questions were answered. The patient knows to call the clinic with any problems, questions or concerns. I spent 10 minutes in the care of this patient including H and P, review of records, counseling and coordination of care.  I connected with  Saryah Hollenbach on 08/08/24 by a telephone application and verified that I am speaking with the correct person using two identifiers.   I discussed the limitations of evaluation and management by telemedicine. The patient  expressed understanding and agreed to proceed.  Location of pt: Home Location of provider: office.     Amber Stalls, MD 08/08/2024 2:59 PM

## 2024-08-13 ENCOUNTER — Other Ambulatory Visit (HOSPITAL_COMMUNITY): Payer: Self-pay

## 2024-08-13 ENCOUNTER — Encounter: Payer: Self-pay | Admitting: Nurse Practitioner

## 2024-08-13 ENCOUNTER — Other Ambulatory Visit: Payer: Self-pay | Admitting: Nurse Practitioner

## 2024-08-13 NOTE — Telephone Encounter (Signed)
 Please advise [facility]

## 2024-08-14 ENCOUNTER — Other Ambulatory Visit (HOSPITAL_COMMUNITY): Payer: Self-pay

## 2024-08-14 ENCOUNTER — Other Ambulatory Visit: Payer: Self-pay

## 2024-08-14 MED ORDER — TRIAMCINOLONE ACETONIDE 0.1 % EX CREA
1.0000 | TOPICAL_CREAM | Freq: Two times a day (BID) | CUTANEOUS | 0 refills | Status: DC
  Filled 2024-08-14: qty 30, 15d supply, fill #0

## 2024-09-16 ENCOUNTER — Other Ambulatory Visit: Payer: Self-pay | Admitting: Nurse Practitioner

## 2024-09-16 DIAGNOSIS — R053 Chronic cough: Secondary | ICD-10-CM

## 2024-09-16 DIAGNOSIS — J40 Bronchitis, not specified as acute or chronic: Secondary | ICD-10-CM

## 2024-09-16 DIAGNOSIS — L0291 Cutaneous abscess, unspecified: Secondary | ICD-10-CM

## 2024-09-17 ENCOUNTER — Other Ambulatory Visit: Payer: Self-pay

## 2024-09-17 ENCOUNTER — Other Ambulatory Visit (HOSPITAL_COMMUNITY): Payer: Self-pay

## 2024-09-29 ENCOUNTER — Other Ambulatory Visit (HOSPITAL_COMMUNITY): Payer: Self-pay

## 2024-10-06 ENCOUNTER — Other Ambulatory Visit (HOSPITAL_COMMUNITY): Payer: Self-pay

## 2024-11-03 ENCOUNTER — Other Ambulatory Visit: Payer: Self-pay | Admitting: Nurse Practitioner

## 2024-11-03 ENCOUNTER — Other Ambulatory Visit (HOSPITAL_COMMUNITY): Payer: Self-pay

## 2024-11-03 ENCOUNTER — Other Ambulatory Visit: Payer: Self-pay

## 2024-11-03 DIAGNOSIS — J40 Bronchitis, not specified as acute or chronic: Secondary | ICD-10-CM

## 2024-11-03 DIAGNOSIS — I10 Essential (primary) hypertension: Secondary | ICD-10-CM

## 2024-11-03 DIAGNOSIS — R600 Localized edema: Secondary | ICD-10-CM

## 2024-11-03 DIAGNOSIS — L0291 Cutaneous abscess, unspecified: Secondary | ICD-10-CM

## 2024-11-03 MED ORDER — TRIAMCINOLONE ACETONIDE 0.1 % EX CREA
1.0000 | TOPICAL_CREAM | Freq: Two times a day (BID) | CUTANEOUS | 0 refills | Status: AC
  Filled 2024-11-03: qty 30, 30d supply, fill #0

## 2024-11-03 MED ORDER — HYDROCHLOROTHIAZIDE 12.5 MG PO CAPS
12.5000 mg | ORAL_CAPSULE | Freq: Every day | ORAL | 2 refills | Status: AC
  Filled 2024-11-03: qty 90, 90d supply, fill #0

## 2024-11-03 MED ORDER — CEPHALEXIN 500 MG PO CAPS
500.0000 mg | ORAL_CAPSULE | Freq: Three times a day (TID) | ORAL | 0 refills | Status: DC
  Filled 2024-11-03 (×2): qty 30, 10d supply, fill #0

## 2024-11-03 MED ORDER — AMLODIPINE BESYLATE 5 MG PO TABS
5.0000 mg | ORAL_TABLET | Freq: Every day | ORAL | 0 refills | Status: AC
  Filled 2024-11-03: qty 90, 90d supply, fill #0

## 2024-11-03 MED ORDER — CLINDAMYCIN PHOS (TWICE-DAILY) 1 % EX GEL
Freq: Two times a day (BID) | CUTANEOUS | 0 refills | Status: AC
  Filled 2024-11-03: qty 30, 30d supply, fill #0

## 2024-11-03 MED ORDER — AMOXICILLIN-POT CLAVULANATE 875-125 MG PO TABS
1.0000 | ORAL_TABLET | Freq: Two times a day (BID) | ORAL | 0 refills | Status: AC
  Filled 2024-11-03: qty 20, 10d supply, fill #0

## 2024-11-03 MED ORDER — VITAMIN D (ERGOCALCIFEROL) 1.25 MG (50000 UNIT) PO CAPS
50000.0000 [IU] | ORAL_CAPSULE | ORAL | 2 refills | Status: AC
  Filled 2024-11-03: qty 5, 35d supply, fill #0

## 2024-11-03 NOTE — Telephone Encounter (Signed)
Vitamin D, Ergocalciferol, (DRISDOL) 1.25 MG (50000 UNIT) CAPS capsule 

## 2024-11-03 NOTE — Telephone Encounter (Signed)
 Please advise

## 2024-11-03 NOTE — Telephone Encounter (Signed)
 Please Advise.  CB.

## 2024-11-03 NOTE — Telephone Encounter (Signed)
 amoxicillin -clavulanate (AUGMENTIN ) 875-125 MG tablet      cephALEXin  (KEFLEX ) 500 MG capsule      hydrochlorothiazide  (MICROZIDE ) 12.5 MG capsule      clindamycin  (CLINDAGEL ) 1 % gel      amLODipine  (NORVASC ) 5 MG tablet      triamcinolone  cream (KENALOG ) 0.1 %

## 2024-11-05 ENCOUNTER — Other Ambulatory Visit (HOSPITAL_COMMUNITY): Payer: Self-pay

## 2024-11-11 NOTE — Progress Notes (Unsigned)
 "  Office Visit Note  Patient: Ariana Rogers             Date of Birth: Jun 24, 1986           MRN: [id number]             PCP: Oley Bascom RAMAN, NP Referring: Loretha Ash, MD Visit Date: 11/14/2024 Occupation: Data Unavailable  Subjective:  No chief complaint on file.   History of Present Illness: Ariana Rogers is a 39 y.o. female ***     Activities of Daily Living:  Patient reports morning stiffness for *** {minute/hour:19697}.   Patient {ACTIONS;DENIES/REPORTS:21021675::Denies} nocturnal pain.  Difficulty dressing/grooming: {ACTIONS;DENIES/REPORTS:21021675::Denies} Difficulty climbing stairs: {ACTIONS;DENIES/REPORTS:21021675::Denies} Difficulty getting out of chair: {ACTIONS;DENIES/REPORTS:21021675::Denies} Difficulty using hands for taps, buttons, cutlery, and/or writing: {ACTIONS;DENIES/REPORTS:21021675::Denies}  No Rheumatology ROS completed.   PMFS History:  Patient Active Problem List   Diagnosis Date Noted   Insomnia 07/26/2023   Snoring 04/30/2023   Acne 08/09/2022   Visit for gynecologic examination 02/17/2022   Encounter for screening breast examination 02/17/2022   Gastroesophageal reflux disease without esophagitis 04/21/2019   Cough 04/21/2019   Acute bronchitis    Breast mass, left 01/28/2019   Painful lumpy left breast 01/28/2019   Maculopapular rash, generalized 12/30/2018   Pityriasis rosea 12/30/2018   Morbid obesity with BMI of 45.0-49.9, adult (HCC) 12/30/2018   Itching 12/30/2018   Leukocytosis 01/17/2018    Past Medical History:  Diagnosis Date   Acute bronchitis    Cough 03/2019   GERD (gastroesophageal reflux disease) 03/2019   Left breast mass    Smoker    Vitamin D  deficiency 09/2020    Family History  Problem Relation Age of Onset   Hypertension Mother    Arthritis Mother    Fibromyalgia Mother    Other Father        no health concern   Past Surgical History:  Procedure Laterality Date   DENTAL SURGERY     TUBAL  LIGATION  2 years ago   Social History[1] Social History   Social History Narrative   Not on file     Immunization History  Administered Date(s) Administered   Influenza,inj,Quad PF,6+ Mos 01/15/2018, 09/27/2020, 08/17/2021   PFIZER(Purple Top)SARS-COV-2 Vaccination 06/07/2020, 07/01/2020   PPD Test 03/26/2018   Pneumococcal Polysaccharide-23 08/17/2021   Tdap 01/15/2018     Objective: Vital Signs: There were no vitals taken for this visit.   Physical Exam   Musculoskeletal Exam: ***  CDAI Exam: CDAI Score: -- Patient Global: --; Provider Global: -- Swollen: --; Tender: -- Joint Exam 11/14/2024   No joint exam has been documented for this visit   There is currently no information documented on the homunculus. Go to the Rheumatology activity and complete the homunculus joint exam.  Investigation: No additional findings.  Imaging: No results found.  Recent Labs: Lab Results  Component Value Date   WBC 13.3 (H) 07/25/2024   HGB 12.5 07/25/2024   PLT 358 07/25/2024   NA 141 07/25/2024   K 3.8 07/25/2024   CL 109 07/25/2024   CO2 28 07/25/2024   GLUCOSE 85 07/25/2024   BUN 10 07/25/2024   CREATININE 0.83 07/25/2024   BILITOT 0.6 07/25/2024   ALKPHOS 53 07/25/2024   AST 13 (L) 07/25/2024   ALT 12 07/25/2024   PROT 6.4 (L) 07/25/2024   ALBUMIN 3.9 07/25/2024   CALCIUM 8.5 (L) 07/25/2024   GFRAA 96 04/21/2019    Speciality Comments: No specialty comments available.  Procedures:  No  procedures performed Allergies: Patient has no known allergies.   Assessment / Plan:     Visit Diagnoses: No diagnosis found.  Orders: No orders of the defined types were placed in this encounter.  No orders of the defined types were placed in this encounter.   Face-to-face time spent with patient was *** minutes. Greater than 50% of time was spent in counseling and coordination of care.  Follow-Up Instructions: No follow-ups on file.   Alfonso Patterson, LPN  Note  - This record has been created using [employer].  Chart creation errors have been sought, but may not always  have been located. Such creation errors do not reflect on  the standard of medical care.    [1]  Social History Tobacco Use   Smoking status: Former    Current packs/day: 0.00    Average packs/day: 0.5 packs/day    Types: Cigarettes, Cigars    Quit date: 03/30/2021    Years since quitting: 3.6   Smokeless tobacco: Never   Tobacco comments:    Black and mild's  2 per day for >10 years.  HFB 07/25/2023  Vaping Use   Vaping status: Never Used  Substance Use Topics   Alcohol use: Yes    Comment: occasionally   Drug use: Yes    Types: Marijuana    Comment: edibles   "

## 2024-11-14 ENCOUNTER — Encounter

## 2024-11-26 ENCOUNTER — Ambulatory Visit (INDEPENDENT_AMBULATORY_CARE_PROVIDER_SITE_OTHER): Admitting: Nurse Practitioner

## 2024-11-26 ENCOUNTER — Encounter: Payer: Self-pay | Admitting: Nurse Practitioner

## 2024-11-26 ENCOUNTER — Other Ambulatory Visit (HOSPITAL_COMMUNITY): Payer: Self-pay

## 2024-11-26 VITALS — BP 129/81 | Temp 98.0°F | Wt 285.0 lb

## 2024-11-26 DIAGNOSIS — Z113 Encounter for screening for infections with a predominantly sexual mode of transmission: Secondary | ICD-10-CM

## 2024-11-26 DIAGNOSIS — J40 Bronchitis, not specified as acute or chronic: Secondary | ICD-10-CM | POA: Diagnosis not present

## 2024-11-26 MED ORDER — CETIRIZINE HCL 10 MG PO CHEW
10.0000 mg | CHEWABLE_TABLET | Freq: Every day | ORAL | 0 refills | Status: AC | PRN
  Filled 2024-11-26: qty 90, 90d supply, fill #0

## 2024-11-26 MED ORDER — CEPHALEXIN 500 MG PO CAPS
500.0000 mg | ORAL_CAPSULE | Freq: Two times a day (BID) | ORAL | 0 refills | Status: AC
  Filled 2024-11-26: qty 20, 10d supply, fill #0

## 2024-11-26 MED ORDER — PROMETHAZINE-DM 6.25-15 MG/5ML PO SYRP
5.0000 mL | ORAL_SOLUTION | Freq: Four times a day (QID) | ORAL | 0 refills | Status: AC | PRN
  Filled 2024-11-26: qty 118, 6d supply, fill #0

## 2024-11-26 NOTE — Progress Notes (Signed)
 "  Subjective   Patient ID: Ariana Rogers, female    DOB: 14-Jan-1986, 39 y.o.   MRN: [id number]  No chief complaint on file.   Referring provider: Oley Bascom RAMAN, NP  Ariana Rogers is a 39 y.o. female with Past Medical History: No date: Acute bronchitis 03/2019: Cough 03/2019: GERD (gastroesophageal reflux disease) No date: Left breast mass No date: Smoker 09/2020: Vitamin D  deficiency  HPI: HPI  Allergies[1]  Immunization History  Administered Date(s) Administered   Influenza,inj,Quad PF,6+ Mos 01/15/2018, 09/27/2020, 08/17/2021   PFIZER(Purple Top)SARS-COV-2 Vaccination 06/07/2020, 07/01/2020   PPD Test 03/26/2018   Pneumococcal Polysaccharide-23 08/17/2021   Tdap 01/15/2018    Tobacco History: Tobacco Use History[2] Counseling given: Not Answered Tobacco comments: Black and mild's  2 per day for >10 years.  HFB 07/25/2023   Outpatient Encounter Medications as of 11/26/2024  Medication Sig   albuterol  (VENTOLIN  HFA) 108 (90 Base) MCG/ACT inhaler Inhale 2 puffs into the lungs every 4 (four) hours as needed for wheezing or shortness of breath.   amLODipine  (NORVASC ) 5 MG tablet Take 1 tablet (5 mg total) by mouth daily.   amoxicillin -clavulanate (AUGMENTIN ) 875-125 MG tablet Take 1 tablet by mouth 2 (two) times daily for 10 days.   benzonatate  (TESSALON ) 200 MG capsule Take 1 capsule (200 mg total) by mouth 2 (two) times daily as needed for cough.   Blood Glucose Monitoring Suppl (BLOOD GLUCOSE MONITOR SYSTEM) w/Device KIT Use up to 4 (four) times daily as directed.   cetirizine  (ZYRTEC ) 10 MG chewable tablet Chew 10 mg by mouth daily as needed.   clindamycin  (CLINDAGEL ) 1 % gel Apply topically 2 (two) times daily.   clindamycin  (CLINDAGEL ) 1 % gel Apply topically 2 (two) times daily.   fluticasone  (FLONASE ) 50 MCG/ACT nasal spray Place 2 sprays into both nostrils daily.   glucose blood test strip Use up to 4 (four) times daily as directed.   guaiFENesin -codeine   (ROBITUSSIN AC) 100-10 MG/5ML syrup Take 5 mLs by mouth 3 (three) times daily as needed for cough.   hydrochlorothiazide  (MICROZIDE ) 12.5 MG capsule Take 1 capsule (12.5 mg total) by mouth daily.   hydrOXYzine  (ATARAX ) 10 MG tablet Take 1 tablet (10 mg total) by mouth 3 (three) times daily as needed.   Lancets (FREESTYLE) lancets Use up to 4 (four) times daily as directed.   promethazine -dextromethorphan (PROMETHAZINE -DM) 6.25-15 MG/5ML syrup Take 5 mLs by mouth 4 (four) times daily as needed for cough.   Semaglutide ,0.25 or 0.5MG /DOS, 2 MG/3ML SOPN Inject 0.5 mg into the skin once a week.   triamcinolone  cream (KENALOG ) 0.1 % Apply 1 Application topically 2 (two) times daily.   Vitamin D , Ergocalciferol , (DRISDOL ) 1.25 MG (50000 UNIT) CAPS capsule Take 1 capsule (50,000 Units total) by mouth every 7 (seven) days.   No facility-administered encounter medications on file as of 11/26/2024.    Review of Systems  Review of Systems   Objective:   There were no vitals taken for this visit.  Wt Readings from Last 5 Encounters:  08/01/24 264 lb 12.8 oz (120.1 kg)  07/25/24 279 lb 3.2 oz (126.6 kg)  06/26/24 283 lb 12.8 oz (128.7 kg)  03/25/24 286 lb 12.8 oz (130.1 kg)  01/31/24 286 lb 6.4 oz (129.9 kg)     Physical Exam    Assessment & Plan:   There are no diagnoses linked to this encounter.   No follow-ups on file.   Ariana Rogers, CMA 11/26/2024     [1] No Known  Allergies [2]  Social History Tobacco Use  Smoking Status Former   Current packs/day: 0.00   Average packs/day: 0.5 packs/day   Types: Cigarettes, Cigars   Quit date: 03/30/2021   Years since quitting: 3.6  Smokeless Tobacco Never  Tobacco Comments   Black and mild's  2 per day for >10 years.  HFB 07/25/2023   "

## 2024-11-26 NOTE — Progress Notes (Signed)
 "  Subjective   Patient ID: Ariana Rogers, female    DOB: [birth year], 39 y.o.   MRN: [id number]  Chief Complaint  Patient presents with   STD check    Would like STD check even though no symptoms due to new partner. Has boils on each side of groin area    Referring provider: Oley Bascom RAMAN, NP  Ariana Rogers is a 39 y.o. female with Past Medical History: No date: Acute bronchitis 03/2019: Cough 03/2019: GERD (gastroesophageal reflux disease) No date: Left breast mass No date: Smoker 09/2020: Vitamin D  deficiency  HPI  Patient presents today for an acute visit.  She states that she does need STD screening today.  She does have a new sexual partner.  She is not currently having any symptoms. Denies f/c/s, n/v/d, hemoptysis, PND, leg swelling. Denies chest pain or edema.  Note: Patient has a history of chronic cough/bronchitis.  She would like a refill on cough medicine and allergy medicine.    Allergies[1]  Immunization History  Administered Date(s) Administered   Influenza,inj,Quad PF,6+ Mos 01/15/2018, 09/27/2020, 08/17/2021   PFIZER(Purple Top)SARS-COV-2 Vaccination 06/07/2020, 07/01/2020   PPD Test 03/26/2018   Pneumococcal Polysaccharide-23 08/17/2021   Tdap 01/15/2018    Tobacco History: Tobacco Use History[2] Counseling given: Not Answered Tobacco comments: Black and mild's  2 per day for >10 years.  HFB 07/25/2023   Outpatient Encounter Medications as of 11/26/2024  Medication Sig   albuterol  (VENTOLIN  HFA) 108 (90 Base) MCG/ACT inhaler Inhale 2 puffs into the lungs every 4 (four) hours as needed for wheezing or shortness of breath.   amLODipine  (NORVASC ) 5 MG tablet Take 1 tablet (5 mg total) by mouth daily.   amoxicillin -clavulanate (AUGMENTIN ) 875-125 MG tablet Take 1 tablet by mouth 2 (two) times daily for 10 days.   Blood Glucose Monitoring Suppl (BLOOD GLUCOSE MONITOR SYSTEM) w/Device KIT Use up to 4 (four) times daily as directed.   cephALEXin  (KEFLEX ) 500  MG capsule Take 1 capsule (500 mg total) by mouth 2 (two) times daily for 10 days.   clindamycin  (CLINDAGEL ) 1 % gel Apply topically 2 (two) times daily.   clindamycin  (CLINDAGEL ) 1 % gel Apply topically 2 (two) times daily.   fluticasone  (FLONASE ) 50 MCG/ACT nasal spray Place 2 sprays into both nostrils daily.   glucose blood test strip Use up to 4 (four) times daily as directed.   hydrochlorothiazide  (MICROZIDE ) 12.5 MG capsule Take 1 capsule (12.5 mg total) by mouth daily.   hydrOXYzine  (ATARAX ) 10 MG tablet Take 1 tablet (10 mg total) by mouth 3 (three) times daily as needed.   Lancets (FREESTYLE) lancets Use up to 4 (four) times daily as directed.   Semaglutide ,0.25 or 0.5MG /DOS, 2 MG/3ML SOPN Inject 0.5 mg into the skin once a week.   triamcinolone  cream (KENALOG ) 0.1 % Apply 1 Application topically 2 (two) times daily.   Vitamin D , Ergocalciferol , (DRISDOL ) 1.25 MG (50000 UNIT) CAPS capsule Take 1 capsule (50,000 Units total) by mouth every 7 (seven) days.   [DISCONTINUED] cetirizine  (ZYRTEC ) 10 MG chewable tablet Chew 10 mg by mouth daily as needed.   benzonatate  (TESSALON ) 200 MG capsule Take 1 capsule (200 mg total) by mouth 2 (two) times daily as needed for cough. (Patient not taking: Reported on 11/26/2024)   cetirizine  (ZYRTEC ) 10 MG chewable tablet Chew 1 tablet (10 mg total) by mouth daily as needed.   guaiFENesin -codeine  (ROBITUSSIN AC) 100-10 MG/5ML syrup Take 5 mLs by mouth 3 (three) times daily as needed  for cough. (Patient not taking: Reported on 11/26/2024)   promethazine -dextromethorphan (PROMETHAZINE -DM) 6.25-15 MG/5ML syrup Take 5 mLs by mouth 4 (four) times daily as needed for cough.   [DISCONTINUED] promethazine -dextromethorphan (PROMETHAZINE -DM) 6.25-15 MG/5ML syrup Take 5 mLs by mouth 4 (four) times daily as needed for cough. (Patient not taking: Reported on 11/26/2024)   No facility-administered encounter medications on file as of 11/26/2024.    Review of  Systems  Review of Systems  Constitutional: Negative.   HENT: Negative.    Cardiovascular: Negative.   Gastrointestinal: Negative.   Allergic/Immunologic: Negative.   Neurological: Negative.   Psychiatric/Behavioral: Negative.       Objective:   BP 129/81   Temp 98 F (36.7 C) (Temporal)   Wt 285 lb (129.3 kg)   BMI 47.43 kg/m   Wt Readings from Last 5 Encounters:  11/26/24 285 lb (129.3 kg)  08/01/24 264 lb 12.8 oz (120.1 kg)  07/25/24 279 lb 3.2 oz (126.6 kg)  06/26/24 283 lb 12.8 oz (128.7 kg)  03/25/24 286 lb 12.8 oz (130.1 kg)     Physical Exam Vitals and nursing note reviewed.  Constitutional:      General: She is not in acute distress.    Appearance: She is well-developed.  Cardiovascular:     Rate and Rhythm: Normal rate and regular rhythm.  Pulmonary:     Effort: Pulmonary effort is normal.     Breath sounds: Normal breath sounds.  Neurological:     Mental Status: She is alert and oriented to person, place, and time.       Assessment & Plan:   Screen for STD (sexually transmitted disease) -     Chlamydia/Gonococcus/Trichomonas, NAA -     HIV Antibody (routine testing w rflx) -     RPR W/RFLX TO RPR TITER, TREPONEMAL AB, SCREEN AND DIAGNOSIS  Bronchitis -     Promethazine -DM; Take 5 mLs by mouth 4 (four) times daily as needed for cough.  Dispense: 118 mL; Refill: 0  Other orders -     Cetirizine  HCl; Chew 1 tablet (10 mg total) by mouth daily as needed.  Dispense: 90 tablet; Refill: 0 -     Cephalexin ; Take 1 capsule (500 mg total) by mouth 2 (two) times daily for 10 days.  Dispense: 20 capsule; Refill: 0     Return if symptoms worsen or fail to improve.   Bascom GORMAN Borer, NP 11/26/2024     [1] No Known Allergies [2]  Social History Tobacco Use  Smoking Status Former   Current packs/day: 0.00   Average packs/day: 0.5 packs/day   Types: Cigarettes, Cigars   Quit date: 03/30/2021   Years since quitting: 3.6  Smokeless Tobacco Never   Tobacco Comments   Black and mild's  2 per day for >10 years.  HFB 07/25/2023   "

## 2024-11-27 LAB — HIV ANTIBODY (ROUTINE TESTING W REFLEX): HIV Screen 4th Generation wRfx: NONREACTIVE

## 2024-11-27 LAB — SYPHILIS: RPR W/REFLEX TO RPR TITER AND TREPONEMAL ANTIBODIES, TRADITIONAL SCREENING AND DIAGNOSIS ALGORITHM: RPR Ser Ql: NONREACTIVE

## 2024-11-28 ENCOUNTER — Ambulatory Visit: Payer: Self-pay | Admitting: Nurse Practitioner

## 2024-11-28 LAB — CHLAMYDIA/GONOCOCCUS/TRICHOMONAS, NAA
Chlamydia by NAA: NEGATIVE
Gonococcus by NAA: NEGATIVE
Trich vag by NAA: NEGATIVE

## 2025-02-18 ENCOUNTER — Ambulatory Visit
